# Patient Record
Sex: Female | Born: 1950
Health system: Southern US, Community
[De-identification: ages and names within clinical notes are randomized; demographics above are authoritative.]

## PROBLEM LIST (undated history)

## (undated) DIAGNOSIS — I639 Cerebral infarction, unspecified: Secondary | ICD-10-CM

## (undated) DIAGNOSIS — J40 Bronchitis, not specified as acute or chronic: Secondary | ICD-10-CM

## (undated) DIAGNOSIS — K759 Inflammatory liver disease, unspecified: Secondary | ICD-10-CM

## (undated) DIAGNOSIS — F419 Anxiety disorder, unspecified: Secondary | ICD-10-CM

## (undated) DIAGNOSIS — G629 Polyneuropathy, unspecified: Secondary | ICD-10-CM

## (undated) DIAGNOSIS — G894 Chronic pain syndrome: Secondary | ICD-10-CM

## (undated) DIAGNOSIS — R112 Nausea with vomiting, unspecified: Secondary | ICD-10-CM

## (undated) DIAGNOSIS — I10 Essential (primary) hypertension: Secondary | ICD-10-CM

## (undated) DIAGNOSIS — Z21 Asymptomatic human immunodeficiency virus [HIV] infection status: Secondary | ICD-10-CM

## (undated) DIAGNOSIS — M1990 Unspecified osteoarthritis, unspecified site: Secondary | ICD-10-CM

## (undated) DIAGNOSIS — N289 Disorder of kidney and ureter, unspecified: Secondary | ICD-10-CM

## (undated) DIAGNOSIS — R0600 Dyspnea, unspecified: Secondary | ICD-10-CM

## (undated) DIAGNOSIS — Z9889 Other specified postprocedural states: Secondary | ICD-10-CM

## (undated) DIAGNOSIS — K219 Gastro-esophageal reflux disease without esophagitis: Secondary | ICD-10-CM

## (undated) DIAGNOSIS — I499 Cardiac arrhythmia, unspecified: Secondary | ICD-10-CM

## (undated) DIAGNOSIS — T50902A Poisoning by unspecified drugs, medicaments and biological substances, intentional self-harm, initial encounter: Secondary | ICD-10-CM

## (undated) DIAGNOSIS — I2510 Atherosclerotic heart disease of native coronary artery without angina pectoris: Secondary | ICD-10-CM

## (undated) DIAGNOSIS — F1490 Cocaine use, unspecified, uncomplicated: Secondary | ICD-10-CM

## (undated) DIAGNOSIS — F17200 Nicotine dependence, unspecified, uncomplicated: Secondary | ICD-10-CM

## (undated) DIAGNOSIS — R569 Unspecified convulsions: Secondary | ICD-10-CM

## (undated) DIAGNOSIS — H9190 Unspecified hearing loss, unspecified ear: Secondary | ICD-10-CM

## (undated) DIAGNOSIS — B20 Human immunodeficiency virus [HIV] disease: Secondary | ICD-10-CM

## (undated) DIAGNOSIS — E669 Obesity, unspecified: Secondary | ICD-10-CM

## (undated) DIAGNOSIS — E785 Hyperlipidemia, unspecified: Secondary | ICD-10-CM

## (undated) DIAGNOSIS — I219 Acute myocardial infarction, unspecified: Secondary | ICD-10-CM

## (undated) DIAGNOSIS — T4145XA Adverse effect of unspecified anesthetic, initial encounter: Secondary | ICD-10-CM

## (undated) DIAGNOSIS — E876 Hypokalemia: Secondary | ICD-10-CM

## (undated) DIAGNOSIS — T8859XA Other complications of anesthesia, initial encounter: Secondary | ICD-10-CM

## (undated) HISTORY — PX: JOINT REPLACEMENT: SHX530

## (undated) HISTORY — PX: LAPAROTOMY: SHX154

## (undated) HISTORY — PX: BACK SURGERY: SHX140

## (undated) HISTORY — PX: FRACTURE SURGERY: SHX138

## (undated) HISTORY — PX: DIAGNOSTIC LAPAROSCOPY: SUR761

## (undated) HISTORY — PX: TONSILLECTOMY: SUR1361

---
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
## 1988-11-29 DIAGNOSIS — I639 Cerebral infarction, unspecified: Secondary | ICD-10-CM

## 1988-11-29 HISTORY — DX: Cerebral infarction, unspecified: I63.9

## 1991-11-30 DIAGNOSIS — I219 Acute myocardial infarction, unspecified: Secondary | ICD-10-CM

## 1991-11-30 HISTORY — DX: Acute myocardial infarction, unspecified: I21.9

## 2005-03-11 ENCOUNTER — Emergency Department: Payer: Self-pay | Admitting: General Practice

## 2005-07-05 ENCOUNTER — Emergency Department: Payer: Self-pay | Admitting: Emergency Medicine

## 2006-01-11 DIAGNOSIS — I6350 Cerebral infarction due to unspecified occlusion or stenosis of unspecified cerebral artery: Secondary | ICD-10-CM | POA: Insufficient documentation

## 2006-02-24 ENCOUNTER — Ambulatory Visit: Payer: Self-pay | Admitting: Nephrology

## 2006-07-11 DIAGNOSIS — M5126 Other intervertebral disc displacement, lumbar region: Secondary | ICD-10-CM | POA: Insufficient documentation

## 2008-01-04 ENCOUNTER — Emergency Department: Payer: Self-pay | Admitting: Emergency Medicine

## 2008-01-04 ENCOUNTER — Other Ambulatory Visit: Payer: Self-pay

## 2008-11-29 HISTORY — PX: HERNIA REPAIR: SHX51

## 2008-12-09 ENCOUNTER — Emergency Department: Payer: Self-pay | Admitting: Unknown Physician Specialty

## 2009-02-22 ENCOUNTER — Emergency Department: Payer: Self-pay | Admitting: Emergency Medicine

## 2009-03-23 ENCOUNTER — Emergency Department: Payer: Self-pay | Admitting: Emergency Medicine

## 2009-04-01 ENCOUNTER — Emergency Department: Payer: Self-pay | Admitting: Emergency Medicine

## 2009-06-29 ENCOUNTER — Emergency Department: Payer: Self-pay | Admitting: Emergency Medicine

## 2009-07-06 ENCOUNTER — Emergency Department: Payer: Self-pay | Admitting: Emergency Medicine

## 2009-09-21 ENCOUNTER — Emergency Department: Payer: Self-pay | Admitting: Emergency Medicine

## 2009-10-03 ENCOUNTER — Emergency Department: Payer: Self-pay | Admitting: Internal Medicine

## 2009-12-23 ENCOUNTER — Emergency Department: Payer: Self-pay

## 2010-01-08 ENCOUNTER — Emergency Department: Payer: Self-pay | Admitting: Emergency Medicine

## 2010-05-25 ENCOUNTER — Emergency Department: Payer: Self-pay | Admitting: Emergency Medicine

## 2011-02-18 ENCOUNTER — Emergency Department: Payer: Self-pay | Admitting: Internal Medicine

## 2011-10-30 ENCOUNTER — Emergency Department: Payer: Self-pay | Admitting: Emergency Medicine

## 2011-12-06 ENCOUNTER — Inpatient Hospital Stay: Payer: Self-pay | Admitting: Surgery

## 2011-12-06 LAB — COMPREHENSIVE METABOLIC PANEL
Alkaline Phosphatase: 204 U/L — ABNORMAL HIGH (ref 50–136)
Anion Gap: 11 (ref 7–16)
Bilirubin,Total: 1.2 mg/dL — ABNORMAL HIGH (ref 0.2–1.0)
Calcium, Total: 9.3 mg/dL (ref 8.5–10.1)
Chloride: 105 mmol/L (ref 98–107)
Co2: 25 mmol/L (ref 21–32)
Creatinine: 0.76 mg/dL (ref 0.60–1.30)
EGFR (African American): >60
EGFR (Non-African Amer.): >60
Glucose: 102 mg/dL — ABNORMAL HIGH (ref 65–99)
Potassium: 3.2 mmol/L — ABNORMAL LOW (ref 3.5–5.1)
SGOT(AST): 61 U/L — ABNORMAL HIGH (ref 15–37)
SGPT (ALT): 46 U/L

## 2011-12-06 LAB — CBC
HCT: 45 % (ref 35.0–47.0)
MCH: 34.2 pg — ABNORMAL HIGH (ref 26.0–34.0)
MCV: 101 fL — ABNORMAL HIGH (ref 80–100)
Platelet: 271 10*3/uL (ref 150–440)
RDW: 14.9 % — ABNORMAL HIGH (ref 11.5–14.5)

## 2011-12-06 LAB — URINALYSIS, COMPLETE
Bacteria: NONE SEEN
Bilirubin,UR: NEGATIVE
Glucose,UR: NEGATIVE mg/dL (ref 0–75)
Ketone: NEGATIVE
Nitrite: NEGATIVE
Ph: 6 (ref 4.5–8.0)
RBC,UR: 1 /HPF (ref 0–5)
Squamous Epithelial: 1

## 2011-12-06 LAB — LIPASE, BLOOD: Lipase: 79 U/L (ref 73–393)

## 2011-12-07 DIAGNOSIS — I369 Nonrheumatic tricuspid valve disorder, unspecified: Secondary | ICD-10-CM

## 2011-12-07 LAB — CBC WITH DIFFERENTIAL/PLATELET
Eosinophil %: 2.4 %
HCT: 38.1 % (ref 35.0–47.0)
Lymphocyte #: 3 10*3/uL (ref 1.0–3.6)
MCV: 103 fL — ABNORMAL HIGH (ref 80–100)
Monocyte %: 7.8 %
Neutrophil #: 3.8 10*3/uL (ref 1.4–6.5)
Neutrophil %: 50.2 %
RBC: 3.69 10*6/uL — ABNORMAL LOW (ref 3.80–5.20)
RDW: 14.8 % — ABNORMAL HIGH (ref 11.5–14.5)

## 2011-12-07 LAB — COMPREHENSIVE METABOLIC PANEL
Alkaline Phosphatase: 150 U/L — ABNORMAL HIGH (ref 50–136)
BUN: 6 mg/dL — ABNORMAL LOW (ref 7–18)
Bilirubin,Total: 1.1 mg/dL — ABNORMAL HIGH (ref 0.2–1.0)
Calcium, Total: 8.6 mg/dL (ref 8.5–10.1)
Chloride: 108 mmol/L — ABNORMAL HIGH (ref 98–107)
Co2: 26 mmol/L (ref 21–32)
Creatinine: 0.72 mg/dL (ref 0.60–1.30)
Osmolality: 281 (ref 275–301)
SGPT (ALT): 36 U/L
Sodium: 142 mmol/L (ref 136–145)
Total Protein: 6.7 g/dL (ref 6.4–8.2)

## 2011-12-08 LAB — CBC WITH DIFFERENTIAL/PLATELET
Basophil #: 0 10*3/uL (ref 0.0–0.1)
Eosinophil %: 2 %
HGB: 12.7 g/dL (ref 12.0–16.0)
Lymphocyte #: 3.3 10*3/uL (ref 1.0–3.6)
Lymphocyte %: 43.2 %
MCV: 104 fL — ABNORMAL HIGH (ref 80–100)
Monocyte %: 11 %
Neutrophil #: 3.3 10*3/uL (ref 1.4–6.5)
Neutrophil %: 43.4 %
Platelet: 228 10*3/uL (ref 150–440)
RBC: 3.76 10*6/uL — ABNORMAL LOW (ref 3.80–5.20)
RDW: 14.6 % — ABNORMAL HIGH (ref 11.5–14.5)
WBC: 7.5 10*3/uL (ref 3.6–11.0)

## 2011-12-08 LAB — BASIC METABOLIC PANEL
BUN: 5 mg/dL — ABNORMAL LOW (ref 7–18)
Chloride: 108 mmol/L — ABNORMAL HIGH (ref 98–107)
Creatinine: 0.79 mg/dL (ref 0.60–1.30)
EGFR (African American): >60
Potassium: 4.1 mmol/L (ref 3.5–5.1)

## 2011-12-28 ENCOUNTER — Inpatient Hospital Stay: Payer: Self-pay | Admitting: Surgery

## 2011-12-28 LAB — COMPREHENSIVE METABOLIC PANEL
Albumin: 2.4 g/dL — ABNORMAL LOW (ref 3.4–5.0)
Anion Gap: 11 (ref 7–16)
Bilirubin,Total: 0.6 mg/dL (ref 0.2–1.0)
Calcium, Total: 6.8 mg/dL — CL (ref 8.5–10.1)
Chloride: 112 mmol/L — ABNORMAL HIGH (ref 98–107)
Co2: 22 mmol/L (ref 21–32)
Creatinine: 0.62 mg/dL (ref 0.60–1.30)
Potassium: 2.6 mmol/L — ABNORMAL LOW (ref 3.5–5.1)
SGOT(AST): 33 U/L (ref 15–37)
SGPT (ALT): 25 U/L
Sodium: 145 mmol/L (ref 136–145)
Total Protein: 6 g/dL — ABNORMAL LOW (ref 6.4–8.2)

## 2011-12-28 LAB — CBC WITH DIFFERENTIAL/PLATELET
Basophil #: 0 10*3/uL (ref 0.0–0.1)
Basophil %: 0.4 %
Eosinophil #: 0.2 10*3/uL (ref 0.0–0.7)
HGB: 12.4 g/dL (ref 12.0–16.0)
Lymphocyte %: 39.1 %
Lymphocytes: 40 %
MCHC: 33.8 g/dL (ref 32.0–36.0)
Monocyte #: 0.6 10*3/uL (ref 0.0–0.7)
Monocytes: 6 %
Neutrophil %: 52.3 %
Platelet: 246 10*3/uL (ref 150–440)
RBC: 3.67 10*6/uL — ABNORMAL LOW (ref 3.80–5.20)
Segmented Neutrophils: 51 %

## 2011-12-28 LAB — URINALYSIS, COMPLETE
Bacteria: NONE SEEN
Bilirubin,UR: NEGATIVE
Blood: NEGATIVE
Glucose,UR: NEGATIVE mg/dL (ref 0–75)
Ketone: NEGATIVE
Nitrite: NEGATIVE
Ph: 6 (ref 4.5–8.0)
RBC,UR: <1 /HPF (ref 0–5)

## 2011-12-28 LAB — LIPASE, BLOOD: Lipase: 81 U/L (ref 73–393)

## 2011-12-28 LAB — PROTIME-INR: Prothrombin Time: 14.2 secs (ref 11.5–14.7)

## 2011-12-28 LAB — APTT: Activated PTT: 28.7 secs (ref 23.6–35.9)

## 2011-12-29 LAB — CBC WITH DIFFERENTIAL/PLATELET
Basophil #: 0 10*3/uL (ref 0.0–0.1)
Basophil %: 0.5 %
Eosinophil #: 0.3 10*3/uL (ref 0.0–0.7)
HCT: 40 % (ref 35.0–47.0)
Lymphocyte #: 2.9 10*3/uL (ref 1.0–3.6)
Lymphocyte %: 40.6 %
MCHC: 33 g/dL (ref 32.0–36.0)
Monocyte #: 0.4 10*3/uL (ref 0.0–0.7)
Monocytes: 8 %
Neutrophil #: 3.6 10*3/uL (ref 1.4–6.5)
RDW: 14 % (ref 11.5–14.5)
WBC: 7.3 10*3/uL (ref 3.6–11.0)

## 2011-12-29 LAB — COMPREHENSIVE METABOLIC PANEL
Albumin: 3 g/dL — ABNORMAL LOW (ref 3.4–5.0)
Anion Gap: 6 — ABNORMAL LOW (ref 7–16)
BUN: 7 mg/dL (ref 7–18)
Bilirubin,Total: 0.7 mg/dL (ref 0.2–1.0)
Chloride: 105 mmol/L (ref 98–107)
Creatinine: 1.01 mg/dL (ref 0.60–1.30)
EGFR (African American): >60
Glucose: 95 mg/dL (ref 65–99)
Potassium: 4.6 mmol/L (ref 3.5–5.1)
SGOT(AST): 49 U/L — ABNORMAL HIGH (ref 15–37)
SGPT (ALT): 33 U/L
Total Protein: 7.4 g/dL (ref 6.4–8.2)

## 2012-01-03 LAB — CULTURE, BLOOD (SINGLE)

## 2012-02-07 ENCOUNTER — Other Ambulatory Visit: Payer: Self-pay | Admitting: Ophthalmology

## 2012-02-07 LAB — CBC WITH DIFFERENTIAL/PLATELET
Basophil %: 0.3 %
HCT: 44.6 % (ref 35.0–47.0)
HGB: 15.1 g/dL (ref 12.0–16.0)
Lymphocyte #: 3.5 10*3/uL (ref 1.0–3.6)
MCH: 33.4 pg (ref 26.0–34.0)
MCHC: 33.7 g/dL (ref 32.0–36.0)
MCV: 99 fL (ref 80–100)
Monocyte #: 0.5 10*3/uL (ref 0.0–0.7)
Neutrophil #: 3.1 10*3/uL (ref 1.4–6.5)
RDW: 14 % (ref 11.5–14.5)

## 2012-03-14 ENCOUNTER — Emergency Department: Payer: Self-pay | Admitting: Emergency Medicine

## 2012-03-14 LAB — COMPREHENSIVE METABOLIC PANEL
Alkaline Phosphatase: 248 U/L — ABNORMAL HIGH (ref 50–136)
BUN: 6 mg/dL — ABNORMAL LOW (ref 7–18)
Bilirubin,Total: 1.1 mg/dL — ABNORMAL HIGH (ref 0.2–1.0)
Co2: 23 mmol/L (ref 21–32)
Creatinine: 0.73 mg/dL (ref 0.60–1.30)
Glucose: 91 mg/dL (ref 65–99)
Potassium: 3.4 mmol/L — ABNORMAL LOW (ref 3.5–5.1)
SGPT (ALT): 31 U/L
Sodium: 139 mmol/L (ref 136–145)
Total Protein: 8.4 g/dL — ABNORMAL HIGH (ref 6.4–8.2)

## 2012-03-14 LAB — URINALYSIS, COMPLETE
Bacteria: NONE SEEN
Bilirubin,UR: NEGATIVE
Blood: NEGATIVE
Glucose,UR: NEGATIVE mg/dL (ref 0–75)
Hyaline Cast: 6
Protein: NEGATIVE
RBC,UR: 1 /HPF (ref 0–5)
Squamous Epithelial: 2

## 2012-03-14 LAB — CBC
HCT: 44.4 % (ref 35.0–47.0)
MCV: 97 fL (ref 80–100)
Platelet: 314 10*3/uL (ref 150–440)
RDW: 13.9 % (ref 11.5–14.5)
WBC: 7.4 10*3/uL (ref 3.6–11.0)

## 2012-03-14 LAB — RAPID INFLUENZA A&B ANTIGENS

## 2013-02-26 DIAGNOSIS — M7730 Calcaneal spur, unspecified foot: Secondary | ICD-10-CM | POA: Insufficient documentation

## 2014-03-07 DIAGNOSIS — B359 Dermatophytosis, unspecified: Secondary | ICD-10-CM | POA: Insufficient documentation

## 2014-06-18 DIAGNOSIS — N289 Disorder of kidney and ureter, unspecified: Secondary | ICD-10-CM | POA: Insufficient documentation

## 2014-12-09 DIAGNOSIS — M5416 Radiculopathy, lumbar region: Secondary | ICD-10-CM | POA: Diagnosis not present

## 2014-12-09 DIAGNOSIS — F329 Major depressive disorder, single episode, unspecified: Secondary | ICD-10-CM | POA: Diagnosis not present

## 2014-12-09 DIAGNOSIS — M545 Low back pain: Secondary | ICD-10-CM | POA: Diagnosis not present

## 2014-12-09 DIAGNOSIS — I10 Essential (primary) hypertension: Secondary | ICD-10-CM | POA: Diagnosis not present

## 2014-12-09 DIAGNOSIS — M179 Osteoarthritis of knee, unspecified: Secondary | ICD-10-CM | POA: Diagnosis not present

## 2014-12-09 DIAGNOSIS — M79606 Pain in leg, unspecified: Secondary | ICD-10-CM | POA: Diagnosis not present

## 2014-12-09 DIAGNOSIS — F1920 Other psychoactive substance dependence, uncomplicated: Secondary | ICD-10-CM | POA: Diagnosis not present

## 2014-12-09 DIAGNOSIS — R52 Pain, unspecified: Secondary | ICD-10-CM | POA: Diagnosis not present

## 2014-12-09 DIAGNOSIS — M5126 Other intervertebral disc displacement, lumbar region: Secondary | ICD-10-CM | POA: Diagnosis not present

## 2014-12-10 DIAGNOSIS — M79609 Pain in unspecified limb: Secondary | ICD-10-CM | POA: Diagnosis not present

## 2014-12-17 DIAGNOSIS — M5126 Other intervertebral disc displacement, lumbar region: Secondary | ICD-10-CM | POA: Diagnosis not present

## 2014-12-17 DIAGNOSIS — M5416 Radiculopathy, lumbar region: Secondary | ICD-10-CM | POA: Diagnosis not present

## 2014-12-17 DIAGNOSIS — M5137 Other intervertebral disc degeneration, lumbosacral region: Secondary | ICD-10-CM | POA: Diagnosis not present

## 2014-12-17 DIAGNOSIS — M5417 Radiculopathy, lumbosacral region: Secondary | ICD-10-CM | POA: Diagnosis not present

## 2015-01-06 DIAGNOSIS — R21 Rash and other nonspecific skin eruption: Secondary | ICD-10-CM | POA: Diagnosis not present

## 2015-01-06 DIAGNOSIS — L859 Epidermal thickening, unspecified: Secondary | ICD-10-CM | POA: Diagnosis not present

## 2015-01-10 DIAGNOSIS — M79609 Pain in unspecified limb: Secondary | ICD-10-CM | POA: Diagnosis not present

## 2015-02-05 DIAGNOSIS — M5126 Other intervertebral disc displacement, lumbar region: Secondary | ICD-10-CM | POA: Diagnosis not present

## 2015-02-05 DIAGNOSIS — M5417 Radiculopathy, lumbosacral region: Secondary | ICD-10-CM | POA: Diagnosis not present

## 2015-03-23 NOTE — Discharge Summary (Signed)
PATIENT NAME:  Lisa Hawkins, ASKIN MR#:  [id number] DATE OF BIRTH:  [birth year]  DATE OF ADMISSION:  12/06/2011 DATE OF DISCHARGE:  12/10/2011  DISCHARGE DIAGNOSES:  1. Incarcerated recurrent ventral hernia.  2. HIV positivity.  3. Hepatitis C positivity.  4. Coronary artery disease.   PROCEDURE: Repair of incarcerated recurrent ventral hernia with mesh.   HISTORY OF PRESENT ILLNESS/HOSPITAL COURSE: This patient was admitted to the hospital with signs of recurrent ventral hernia which was incarcerated.  She had had a prior umbilical hernia repair and this has recurred. She was taken to the Operating Room where incarcerated omentum was reduced and the large rent was prepared with mesh. She made an uncomplicated postoperative recovery and was discharged in stable condition to follow up in my office in 10 days. She was tolerating a regular diet and utilizing oral pain medications.     ____________________________ Jerrol Banana Burt Knack, MD rec:bjt D: 12/21/2011 00:08:25 ET T: 12/22/2011 09:25:05 ET JOB#: [id number]  cc: Jerrol Banana. Burt Knack, MD, <Dictator> Florene Glen MD ELECTRONICALLY SIGNED 12/22/2011 19:31

## 2015-03-23 NOTE — H&P (Signed)
PATIENT NAME:  KINDEL, ROCHEFORT MR#:  [id number] DATE OF BIRTH:  Feb 02, 1951  DATE OF ADMISSION:  12/28/2011  HISTORY OF PRESENT ILLNESS: Ms. Trombetta is a 64 year old black female who underwent redo repair of a recurrent ventral hernia 12/07/2011 with Ventralex mesh (6 cm). She did well and was seen in the office postoperatively and was then began developing periumbilical pain at the area of her incision on Sunday (two days ago). This pain has gotten much worse and is associated with nausea. She does not know whether she has had fever or chills.   PAST MEDICAL HISTORY:  1. HIV positive.  2. Chronic active hepatitis C virus.  3. Remote history of IV drug abuse.  4. History of myocardial infarction 1983.  5. History of cerebrovascular accident 2003.  6. Gallstones.  7. Hypertension.   PAST SURGICAL HISTORY:  1. Ventral hernia repair.  2. Redo ventral hernia repair with mesh. 3. Hysterectomy,  4.   Bilateral oophorectomy (separate procedures).  5. Back surgery. 6. ORIF of the wrist.   ALLERGIES: Penicillin.   MEDICATIONS:  1. Amitriptyline 25 mg daily.  2. Amlodipine 10 mg daily. 3. Citalopram 30 mg daily.  4. Hydrochlorothiazide 25 mg daily.  5. Lamivudine 300 mg daily.  6. Omeprazole 20 mg daily.  7. Sustiva 600 mg daily.  8. Tramadol 50 mg t.i.d.  9. Viread 300 mg daily.  10. Zoloft 50 mg daily.   FAMILY HISTORY: Noncontributory.   REVIEW OF SYSTEMS: Negative for 10 systems except as mentioned in the history of present illness above.   SOCIAL HISTORY: The patient is separated from her husband and lives with someone else. She smokes 1 pack of cigarettes per day and does not drink alcohol. She has a previous remote history of IV drug abuse.   PHYSICAL EXAMINATION:  GENERAL: A pleasant, middle-aged black female who has some anxiety.   VITAL SIGNS: Height 5 feet 3 inches, weight 213 pounds. BMI 37.7. Temperature 98.2, pulse 104, respirations 18, blood pressure 151/94, oxygen  saturation 97% on room air.   HEENT: Pupils equally round and reactive to light. Extraocular movements intact. Sclerae nonicteric. Oropharynx clear. Mucous membranes moist.   NECK: Without thyromegaly. The trachea is midline and there is no jugular venous distention.   HEART: Regular rate and rhythm with no murmurs or rubs.   LUNGS: Clear to auscultation with normal respiratory effort bilaterally.   ABDOMEN: Reveals a periumbilical midline scar with surrounding blanching erythema that is exquisitely tender to touch. I cannot appreciate any fluctuance, however.   EXTREMITIES: No edema with normal capillary refill bilaterally.   NEUROLOGIC: Cranial nerves II through XII, motor and sensation grossly intact.   PSYCHIATRIC: Alert and oriented x4. Appropriate affect.   ASSESSMENT: Cellulitis in a patient who is immunosuppressed with HIV in a previous area of redo ventral hernia repair with mesh. Possible mesh infection.   PLAN: Admit to hospital for IV antibiotics and observation. If she improves on IV antibiotics I will convert her to p.o. antibiotics and discharge her. If she does not improve, she may need a drainage of the incision. ID consult since HIV +  ____________________________ Consuela Mimes, MD wfm:cms D: 12/28/2011 16:10:28 ET T: 12/28/2011 16:37:38 ET JOB#: [id number]  cc: Consuela Mimes, MD, <Dictator> Consuela Mimes MD ELECTRONICALLY SIGNED 12/28/2011 17:41

## 2015-03-23 NOTE — Op Note (Signed)
PATIENT NAME:  Lisa Hawkins, Lisa Hawkins MR#:  [id number] DATE OF BIRTH:  [birth year]  DATE OF PROCEDURE:  12/07/2011  PREOPERATIVE DIAGNOSIS: Incarcerated recurrent ventral hernia.   POSTOPERATIVE DIAGNOSIS: Incarcerated recurrent ventral hernia.   PROCEDURE: Repair of incarcerated recurrent ventral hernia with mesh.   SURGEON: Phoebe Perch, M.D.   ANESTHESIA: General with endotracheal tube.   INDICATIONS: This is a patient with a recurrent ventral hernia which is incarcerated. Preoperatively we discussed rationale for surgery, the options of observation, the potential for using mesh, and the risk of mesh infection and recurrence as well as cosmetic deformity. This was all reviewed for her in the preop holding area. She understood and agreed to proceed.   FINDINGS: Recurrent ventral hernia. The prior ventral hernia in the periumbilical area had been repaired with zero Ethibonds and there was a recurrence of the cephalad extent of these Ethibond sutures with incarcerated omentum and no bowel involved and no stomach involved.   DESCRIPTION OF PROCEDURE: The patient was induced to general anesthesia. She was given IV antibiotics preoperatively. VTE prophylaxis was in place. She was prepped and draped in a sterile fashion. A midline incision was utilized to open and explore the subcutaneous tissues and in so doing a large hernia sac was identified with omentum present. The hernia sac was opened and the fascial edges were cleaned. The omentum was reduced into the abdomen, but in order to completely reduce the omentum the hernial rent needed to be enlarged slightly with electrocautery and then the omental portion was reduced into the abdominal cavity.  The area was inspected, fascial edges were cleaned, and the preperitoneal space was developed and into that space was placed a 6 cm Ventralex patch with PTFE. It was held in with figure-of-eight and U sutures of 0 Prolene and then the perifascial and fascial  tissues were closed over the top of the mesh as well.   Marcaine was infiltrated in the skin and subcutaneous tissues for a total of 30 mL and then the wound was closed in multiple layers with 0 Vicryl to isolate the fascia, mesh, and sutures from the skin, etc.   A deep suture at the dermal edges was performed with 0 Vicryl as well and then skin staples were placed in a sterile dressing was placed.   Sponge, lap, and needle counts were correct. The patient tolerated the procedure well. There were no complications. She was taken to the Recovery Room in stable condition to be admitted for continued care.  ____________________________ Jerrol Banana. Burt Knack, MD rec:slb D: 12/07/2011 14:58:15 ET T: 12/07/2011 16:22:42 ET JOB#: [id number]  cc: Jerrol Banana. Burt Knack, MD, <Dictator> Florene Glen MD ELECTRONICALLY SIGNED 12/07/2011 17:23

## 2015-03-23 NOTE — Discharge Summary (Signed)
PATIENT NAME:  Lisa Hawkins, Lisa Hawkins MR#:  [id number] DATE OF BIRTH:  21-Feb-1951  DATE OF ADMISSION:  12/28/2011 DATE OF DISCHARGE:  12/30/2011  PRINCIPLE DIAGNOSIS: Infected postoperative hematoma following redo repair of ventral hernia with mesh.   OTHER DIAGNOSES:  1. HIV positive.  2. Chronic active Hepatitis C virus.  3. Remote history of IV drug abuse.  4. History of myocardial infarction in 1983.  5. History of cerebrovascular accident in 2003.  6. Gallstones.  7. Hypertension.  8. Status post ventral hernia repair.  9. Status post redo ventral hernia repair with mesh.  10. Status post hysterectomy.  11. Status post bilateral oophorectomy.  12. Status post back surgery.  13. Status post ORIF of the wrist.   HOSPITAL COURSE: [name] was admitted to the hospital and given analgesics and IV antibiotics. An Infectious Disease consultation was obtained and converted her IV antibiotics to p.o. doxycycline. Her area of redness spontaneously drained what looked like old hematoma that was likely infected. By the 31st the erythema was completely resolved, there was just minimal drainage, and the patient had been afebrile throughout her hospitalization. Therefore, she was discharged home on p.o. doxycycline and given an appointment to follow-up with Dr. Burt Knack.  ____________________________ Consuela Mimes, MD wfm:drc D: 12/30/2011 11:29:38 ET T: 12/30/2011 13:13:34 ET JOB#: [id number]  cc: Consuela Mimes, MD, <Dictator> Consuela Mimes MD ELECTRONICALLY SIGNED 01/06/2012 20:13

## 2015-03-23 NOTE — Consult Note (Signed)
PATIENT NAME:  Lisa Hawkins, Lisa Hawkins MR#:  [id number] DATE OF BIRTH:  13-Mar-1951  DATE OF CONSULTATION:  12/06/2011  REFERRING PHYSICIAN:  Phoebe Perch, MD  PRIMARY CARE PHYSICIAN and INFECTIOUS DISEASE SPECIALIST:  [facility]  CONSULTING PHYSICIAN:  Cherre Huger, MD  REASON FOR CONSULTATION: Preoperative medical evaluation.   HISTORY OF PRESENT ILLNESS: The patient is a 64 year old female with past medical history of HIV, hepatitis C, hypertension who presented to the Emergency Room complaining of abdominal pain and a knot in her stomach. She was found to have a ventral hernia. Dr. Burt Knack planned to take her to the Operating Room on 01/08 to repair it. Medicine was consulted for preoperative medical clearance. The patient reports that she had an MI in 1983 and was admitted to [facility] and at that time she had developed some left arm numbness. She denies having any angioplasty, stents or being referred for bypass surgery. She also reported that she had a CVA in 2013 because her legs felt stiff.  Reported that she was diagnosed with a CVA in 2013 after she went to the hospital for the leg stiffness. Currently she is able to ambulate independently. She denies any chest pain, shortness of breath, history of heart failure however, she does report that she is not very physically active. Denies any history of having recurrent stroke, or a heart attack. Denies any family history of premature coronary artery disease.  ALLERGIES: Penicillin.   CURRENT MEDICATIONS: Vicodin 1 tablet every six hours p.r.n.; amitriptyline 25 mg daily.; Epivir 300 mg daily; Neurontin 300 mg daily; hydrochlorothiazide 25 mg daily; Naproxen p.r.n.; omeprazole 20 mg daily; Sustiva 600 mg daily;Marland Kitchen Zoloft 50 mg daily; Tramadol 50 mg t.i.d. p.r.n.   PAST MEDICAL HISTORY:  1. History of HIV.  2. Hepatitis C.  3. Hypertension.  4. History of myocardial infarction in 1983. 5. Cerebrovascular accident 2003.  PAST SURGICAL  HISTORY: Hysterectomy, oophorectomy, left wrist ORIF for fracture, back surgery, tonsillectomy, prior  ventral hernia repair.  SOCIAL HISTORY: Smokes 1 pack per day. He denies any alcohol or drug abuse in the past. She used to shoot heroine and cocaine IV. She lives with her friend.   FAMILY HISTORY: Mother had some back problems. Father had cerebrovascular accident.   REVIEW OF SYSTEMS:  CONSTITUTIONAL: Reports chronic fatigue and weakness. Denies any fever.   EYES: Denies any double or blurred vision.   ENT: Denies any tinnitus, ear pain.   RESPIRATORY: Denies any painful respiration, wheezing, and cough.   CVS:  Denies any chest pain, tachycardia, or palpitations.   GI: Denies any nausea, vomiting, diarrhea, has abdominal pain.   GU: Denies any nocturia, or polyuria.   MUSCULOSKELETAL: Denies any joint pain, stiffness  INTEGUMENTARY:. She denies any rashes or eruptions.   NEUROLOGICAL: Denies any fainting, blackouts, seizures, gives history of cerebrovascular accident.   PSYCH: Has history of depression. Denies any insomnia or headache.   ENDOCRINE: Denies any thyroid problems, heat or cold intolerance.  HEME/LYMPH: Denies any anemia or easy bruisability.   PHYSICAL EXAMINATION:  VITAL SIGNS: Temperature 98, heart rate 99, respiratory  rate 18, blood pressure 125/67, pulse oximetry 98% on room air.   GENERAL: The patient is a 64 year old African American female who is  sitting up propped up in bed in some distress.   HEAD: Atraumatic, normocephalic.   EYES: No pallor, icterus, or cyanosis. Pupils equal, round, and reactive to light and accommodation. Extraocular movements intact.   ENT: Wet mucous membranes. No oropharyngeal  erythema or thrush.   NECK: Supple. No masses. No JVD. No thyromegaly or lymphadenopathy. Chest wall: No tenderness to palpation. Not using accessory muscles of respiration. No intercostal retractions.   LUNGS: The patient has bilateral basal  crepitations. No wheezing or rhonchi.   CARDIOVASCULAR: S1, S2 regular. No murmur, rubs, or gallops.   ABDOMEN: Soft, tender to palpation. The patient has a periumbilical ventral hernia. No guarding. No rigidity. Normal bowel sounds.   SKIN: No rashes or lesions.  PERIPHERY:  No pedal edema, 1+ pedal pulses.   MUSCULOSKELETAL: No cyanosis or clubbing.   NEUROLOGICAL: Awake, alert, oriented x3. Nonfocal neurological exam. Cranial nerves grossly intact.   PSYCH: Normal mood and affect.   Laboratory, diagnostic, and radiological data: CAT scan of the abdomen shows enlargement of ventral hernia which was increased in size. No evidence of any  gallstones. Urinalysis shows no evidence of infection. Glucose 102, normal BUN and creatinine, sodium 141, potassium 3.2, bilirubin 1.2 and ALT 204, AST 61. Normal CBC except for mild microcytosis.   ASSESSMENT AND PLAN: A 64 year old female with past medical history of HIV, hepatitis C, possible myocardial infarction, cerebrovascular accident, hypertension, admitted to surgical service for repair of ventral hernia. Medicine is consulted for preoperative medical evaluation.  1. Preop medical evaluation. The patient reports that she had an myocardial infarction in 1983; however  denies having any recurrent myocardial infarctions, angioplasty, stents status, or  coronary artery bypass graft and she did also reports having a CVA in 2013 without any residual  deficit. She ambulates independently. Currently denies any chest pain, shortness of breath, although reports that she is physically not very active. She is moderate to high risk for surgery. However, there is no obvious contraindication to proceeding with surgery. Recommend starting low-dose beta blocker, especially since the patient has a history of hypertension. Should continue  postoperatively also. Will also check her echocardiogram.  2. Hypokalemia, likely due to the patient being on treatment with  hydrochlorothiazide for hypertension replace p.o.  3. History of HIV and hepatitis B.  The patient follows up with [facility] would continue the patient's Epivir and Sustiva. The patient's CBC is currently normal.  4. Hyperglycemia, likely reactive. The patient has no history of diabetes. 5. For elevated bilirubin, ALT and AST: Possibly due to history of gallstones. Monitor. 6. Hypertension. Treated with low-dose beta blocker instead of HCTZ given his history of myocardial infarction and cerebrovascular accident.  7. History of GI bleed. The patient is on IV PPI. Ventral hernia management as per surgery. R  Reviewed all medical records, discussed with the patient the plan of care and management.   TIME SPENT: 75  minutes.    ____________________________ Cherre Huger, MD sp:ljs D: 12/06/2011 19:29:46 ET T: 12/07/2011 10:11:32 ET JOB#: [id number]  cc: Cherre Huger, MD, <Dictator> Richard E. Burt Knack, MD Cherre Huger MD ELECTRONICALLY SIGNED 12/07/2011 19:17

## 2015-03-23 NOTE — H&P (Signed)
PATIENT NAME:  Lisa Hawkins, Lisa Hawkins MR#:  [id number] DATE OF BIRTH:  Sep 10, 1951  DATE OF ADMISSION:  12/06/2011  CHIEF COMPLAINT: Abdominal pain.   HISTORY OF PRESENT ILLNESS: This is a patient with a 2.5 to 3 day history of abdominal pain and mass. She points to a mass in the cephalad area near the umbilicus. It has not been worsening. She has had no nausea or vomiting until she tried taking CT contrast. Has been able to eat and has had normal bowel movements and passing flatus. She's had no fevers or chills. She states she has never had an episode like this before but on reviewing prior CT scans of this hernia a recurrence has been there for at least two years back to 2010.   Of note, the patient is HIV positive and Hepatitis C positive as well. She has had a prior repair of an umbilical hernia. She is not sure if mesh was used. This was in the remote past.   PAST MEDICAL HISTORY:  1. HIV positivity. 2. Hepatitis C positivity likely secondary to prior IV drug abuse. She denies recent IV drug abuse.  3. She has had an MI and has coronary artery disease, possible valvular disease.   PAST SURGICAL HISTORY:  1. Back surgery.  2. Ventral hernia repair, not clear if mesh was utilized. Since that time she has had two separate GYN surgeries, a hysterectomy followed by a bilateral oophorectomy.   ALLERGIES: Penicillin.   MEDICATIONS: Multiple, see chart.   FAMILY HISTORY: Noncontributory.   SOCIAL HISTORY: The patient denies tobacco abuse and stopped IV drug abuse years ago.   REVIEW OF SYSTEMS: 10 system review has been performed and negative with the exception of that mentioned in the history of present illness.   PHYSICAL EXAMINATION:   GENERAL: Morbidly obese female patient 63 inches tall, 213 pounds, BMI 37.7.  VITAL SIGNS: Temperature 96.9, pulse 99, respirations 20, blood pressure 119/71, pain scale 0, room air sats 94%.   HEENT: No scleral icterus.   NECK: No palpable neck nodes.    CHEST: Clear to auscultation.   CARDIAC: Regular rate and rhythm.   ABDOMEN: Soft, nondistended. There is a somewhat hard mass in the area cephalad and to the left of the umbilicus. There is a scar at the umbilicus. This mass is nonreducible, tender but there is no overlying erythema. Pfannenstiel incision is present.   EXTREMITIES: Minimal edema.   NEUROLOGIC: Grossly intact.   INTEGUMENTARY: No jaundice.   LABORATORY, DIAGNOSTIC, AND RADIOLOGICAL DATA: CT scan is personally reviewed and compared to CT from 2010. There is a recurrent ventral hernia in the area of the umbilicus somewhat cephalad and to the left of the umbilicus. There is no bowel involvement in the hernia itself, a fairly large sac with a small rent.   White blood cell count 7.5, hemoglobin and hematocrit 15 and 45. Lipase 79. Electrolytes showed potassium of 3.2 and a bilirubin 1.2 with an alkaline phosphatase of 204 and a glucose of 102. Urinalysis is clean.   ASSESSMENT AND PLAN: This is a patient with HIV and Hepatitis C who presents with a recurrent ventral hernia which is incarcerated. It has been out for at least two days but has been present, at least on CT scan, since 2010. I do not believe the patient needs emergent operation for this repair in that there is likely history of longstanding incarceration and the fact that there is no bowel involved. My plan would be to admit  the patient to the hospital, hydrate her, and ask [facility] to see her for evaluation, a surgical clearance, and assistance in management of her medical problems perioperatively. I have discussed with her the rationale for offering surgery and the potential for mesh placement and the risks of bleeding, infection, and recurrence. She understands all this. I will review that for her again prior to her surgical intervention which will be planned for tomorrow.   ____________________________ Jerrol Banana Burt Knack, MD rec:drc D: 12/06/2011 18:36:30  ET T: 12/07/2011 06:41:58 ET JOB#: [id number]  cc: Jerrol Banana. Burt Knack, MD, <Dictator> Florene Glen MD ELECTRONICALLY SIGNED 12/07/2011 17:22

## 2015-03-23 NOTE — H&P (Signed)
Subjective/Chief Complaint abd pain    History of Present Illness pain and bulge started Saturday 2 1/2 days ago, not worsening, no n/v. no f/c. no prior episode.    Past History HIV positive likely secondary to IVDA. CAD with MI and possible valvular dz PSH  back surgery, VH repair (mesh?) hysterectomy, oophorectomy (both two surgeries after VH repair)    Past Medical Health Coronary Artery Disease   Past Med/Surgical Hx:  CVA:   Hepatitis C:   MI:   HIV:   Hypertension:   back surgery:   partial hysterectomy:   tonsillectomy:   Back Surgery:   ALLERGIES:  Penicillin: Anaphylaxis  Family and Social History:   Family History Non-Contributory    Social History negative tobacco, negative ETOH, positive Illicit drugs, in past    Place of Living Home   Review of Systems:   Subjective/Chief Complaint abd pain and mass    Fever/Chills No    Cough No    Abdominal Pain Yes    Diarrhea No    Constipation No    Nausea/Vomiting No    SOB/DOE No    Chest Pain No    Dysuria No    Tolerating Diet Yes   Physical Exam:   GEN NAD, obese    HEENT pink conjunctivae    NECK supple    RESP normal resp effort  clear BS    CARD regular rate    ABD positive tenderness  positive hernia  soft  adominal Mass  periumb scar. rec VH with incarceration cephalad and to left of umbilicus    LYMPH negative neck    EXTR negative edema    SKIN normal to palpation    PSYCH alert, A+O to time, place, person, good insight   Routine UA:  07-Jan-13 13:05    Color (UA) Yellow   Clarity (UA) Clear   Glucose (UA) Negative   Bilirubin (UA) Negative   Ketones (UA) Negative   Specific Gravity (UA) 1.010   Blood (UA) Negative   pH (UA) 6.0   Protein (UA) Negative   Nitrite (UA) Negative   Leukocyte Esterase (UA) Trace   RBC (UA) 1 /HPF   WBC (UA) 1 /HPF   Epithelial Cells (UA) 1 /HPF   Calcium Oxalate Crystal (UA) PRESENT  Routine Chem:  07-Jan-13 13:05     Glucose, Serum 102   BUN 10   Creatinine (comp) 0.76   Sodium, Serum 141   Potassium, Serum 3.2   Chloride, Serum 105   CO2, Serum 25   Calcium (Total), Serum 9.3  Hepatic:  07-Jan-13 13:05    Bilirubin, Total 1.2   Alkaline Phosphatase 204   SGPT (ALT) 46   SGOT (AST) 61   Total Protein, Serum 8.2   Albumin, Serum 3.6  Routine Chem:  07-Jan-13 13:05    Osmolality (calc) 280   eGFR (African American) >60   eGFR (Non-African American) >60   Anion Gap 11  Routine Hem:  07-Jan-13 13:05    WBC (CBC) 7.5   RBC (CBC) 4.44   Hemoglobin (CBC) 15.2   Hematocrit (CBC) 45.0   Platelet Count (CBC) 271   MCV 101   MCH 34.2   MCHC 33.7   RDW 14.9  Routine Chem:  07-Jan-13 13:05    Lipase 79     Assessment/Admission Diagnosis CT pending will check CT to determine planning and timing of surgery. Has been incarcerated for several days without signs of obstruction therefore doubt  bowel involvement. await CT disc options with pt. she wants surgery here rather than at [facility] where she gets most of her care.   Electronic Signatures: Florene Glen (MD)  (Signed 07-Jan-13 18:00)  Authored: CHIEF COMPLAINT and HISTORY, PAST MEDICAL/SURGIAL HISTORY, ALLERGIES, FAMILY AND SOCIAL HISTORY, REVIEW OF SYSTEMS, PHYSICAL EXAM, LABS, ASSESSMENT AND PLAN   Last Updated: 07-Jan-13 18:00 by Florene Glen (MD)

## 2015-03-23 NOTE — Consult Note (Signed)
Impression: 64yo BF w/ h/o HIV infection, chronic hep C infection, recent ventral hernia repair with mesh placement admitted with possible wound infection vs hematoma.  She had no WBC or fever systemically, but she describes the area around the wound as being red and hot.  The lesion has drained and she is feeling better today.  No cultures were obtained from the drained material and currently there is little drainage.  It is difficult to say whether there was infection present.  Typically drainage is all that is needed to resolve this.  If antibiotic coverage is desired, will change her to doxycycline.  This will cover Strep and Staph (including MRSA). She does not know her CD4 count.  She says that her provider at [facility] told her her numbers were "good".  She is not on OI prophylaxis.  Would continue her current HAART. She has never been treated for hep C infection.  She reports that the providers at [facility] were waiting until her back pain was adequately addressed.  She should follow up with her regular physician as scheduled.  Electronic Signatures: Roizy Harold, Heinz Knuckles (MD)  (Signed on 30-Jan-13 12:42)  Authored  Last Updated: 30-Jan-13 12:42 by Gregor Dershem, Heinz Knuckles (MD)

## 2015-03-23 NOTE — Consult Note (Signed)
PATIENT NAME:  Lisa Hawkins, Lisa Hawkins MR#:  [id number] DATE OF BIRTH:  Apr 08, 1951  DATE OF CONSULTATION:  12/29/2011  REFERRING PHYSICIAN:  Consuela Mimes, Hawkins  CONSULTING PHYSICIAN:  Lisa Hawkins  REASON FOR CONSULTATION: HIV patient with a possible wound infection.   HISTORY OF PRESENT ILLNESS: The patient is a 64 year old black female with a past history significant for chronic hepatitis C infection, HIV infection with an unknown CD4 count, and a recent ventral hernia repair with mesh placement on 12/07/2011, who was admitted on 12/28/2011 with acute onset several days prior to admission of pain in the area of her prior incision. The pain increased over time. She developed two areas at the superior and inferior aspect of her wound that were swollen, and she describes some erythema around the site. She had no fevers, chills, or sweats, but she did have notice warmth from the wound itself. She was seen by Surgery and admitted to the hospital. She has been started on vancomycin. Blood cultures have been drawn and are negative to date. She has remained afebrile in the hospital, and her white count has been normal. Today, she had an area of the wound open and drain bloody and clotted material. No purulence was noted by the patient. Her pain has eased somewhat. She has not had any significant change in her bowels. She has not had any nausea or vomiting. She is currently on vancomycin.   ALLERGIES: Penicillin.   PAST MEDICAL HISTORY:  1. HIV infection. She is followed at [facility]. She does not know her CD4 count or her viral load. She is currently on HAART and is tolerating them well. She states that the last time she was seen she was told that her blood counts were all good.  2. Chronic hepatitis C infection: She has not been on therapy in the past. She was told that they would address this after her back issues were settled.  3. Chronic back pain. She has had surgery in the past. She is  currently receiving injections through pain management.  4. Status post myocardial infarction.  5. Status post CVA.  6. Hypertension.  7. Ventral hernia repair in January of 2013.  8. Status post hysterectomy.  9. Status post bilateral oophorectomy.  SOCIAL HISTORY: The patient lives with a significant other. She smokes a pack of cigarettes per day. She does not drink. She has a prior history of injecting drug use but no current use.   FAMILY HISTORY: Noncontributory.   REVIEW OF SYSTEMS: GENERAL: No fevers, chills, sweats, RESPIRATORY: No cough or shortness of breath. No sputum production. CARDIAC: No chest pains or palpitations. GI: No nausea or vomiting. No change in her bowels. She does have abdominal pain as described above. GENITOURINARY: No change in her urine. MUSCULOSKELETAL: No complaints. SKIN: No rashes.  NEUROLOGICAL: No focal weakness.   PHYSICAL EXAMINATION:  VITAL SIGNS: T-max of 98.5, pulse 93, blood pressure 118/74, 97% on room air.   GENERAL: A 64 year old black female in no acute distress.   HEENT: Normocephalic, atraumatic.   CHEST: Clear to auscultation bilaterally with good air movement. No focal consolidation.   CARDIAC: Regular rate and rhythm without murmur, rub, or gallop.   ABDOMEN: Soft, nontender, and nondistended. No hepatosplenomegaly. No hernia is noted. There is a midline incision that appears to be healing well. There was no significant erythema surrounding the wound. To the left slightly superior aspect of the wound, there was an ulceration. The dressing has  serosanguineous drainage on it. There was no purulence. The area around the wound was tender to touch. There was no an area of fluctuance around the ulceration or the rest of the wound.   EXTREMITIES: No evidence for tenosynovitis.   SKIN: No rashes other than the ulcer around the surgical site.   NEUROLOGIC: The patient was awake and interactive, moving all four extremities.   PSYCHIATRIC: Mood  and affect appeared normal.     LABORATORY, DIAGNOSTIC AND RADIOLOGICAL DATA:  BUN 7, creatinine 1.01, bicarbonate 26, anion gap of 6, AST 49, ALT 33, alkaline phosphatase 200, total bilirubin 0.7.  White count of 7.3, hemoglobin 13.2, platelet count of 251, ANC of 3.6.  Blood cultures from admission showed no growth.  A urinalysis was unremarkable.  A three-way of the abdomen demonstrated atelectasis in the left base but no other abnormalities noted.   IMPRESSION: A 64 year old black female with a past history significant for HIV infection, chronic hepatitis C infection, recent ventral hernia repair with mesh placement, admitted with possible wound infection versus hematoma.   RECOMMENDATIONS:  1. She has had no white cells, fever systemically, but she describes the area around the wound as being red and hot. The lesion is drained and she is feeling better today. No cultures were obtained from the drained material, and currently there is little drainage.  2. It is difficult to say whether there was infection present. Typically, drainage is all that is needed to resolve this. If antibiotic coverage is desired, we will change her to doxycycline. This will cover strep and staph (including methicillin-resistant Staphylococcus aureus).  3. She does not know her CD4 count. She says that her provider at [facility] told her that her numbers were "good." She is not on opportunistic infection prophylaxis. We will continue her current HAART.  4. She has never been treated for hepatitis C infection. She reports the providers at [facility] were waiting until her back pain was adequately addressed. She should follow up with her regular physician as scheduled.   This is a low-level Infectious Disease consult.   Thank you very much for involving me in Lisa Hawkins's care.  ____________________________ Lisa Knuckles. Greer Koeppen, Hawkins meb:cbb D: 12/29/2011 14:47:14 ET T: 12/29/2011 15:37:41 ET JOB#: [id number]  cc: Lisa Knuckles.  Lisa Baksh, Hawkins, <Dictator> Lisa Velador E Shelie Lansing Hawkins ELECTRONICALLY SIGNED 01/10/2012 13:19

## 2015-04-09 DIAGNOSIS — F329 Major depressive disorder, single episode, unspecified: Secondary | ICD-10-CM | POA: Diagnosis not present

## 2015-04-09 DIAGNOSIS — F1920 Other psychoactive substance dependence, uncomplicated: Secondary | ICD-10-CM | POA: Diagnosis not present

## 2015-04-09 DIAGNOSIS — Z720 Tobacco use: Secondary | ICD-10-CM | POA: Diagnosis not present

## 2015-04-09 DIAGNOSIS — M7731 Calcaneal spur, right foot: Secondary | ICD-10-CM | POA: Diagnosis not present

## 2015-04-09 DIAGNOSIS — M5126 Other intervertebral disc displacement, lumbar region: Secondary | ICD-10-CM | POA: Diagnosis not present

## 2015-04-09 DIAGNOSIS — M179 Osteoarthritis of knee, unspecified: Secondary | ICD-10-CM | POA: Diagnosis not present

## 2015-04-09 DIAGNOSIS — E669 Obesity, unspecified: Secondary | ICD-10-CM | POA: Diagnosis not present

## 2015-04-09 DIAGNOSIS — R52 Pain, unspecified: Secondary | ICD-10-CM | POA: Diagnosis not present

## 2015-07-01 DIAGNOSIS — G8929 Other chronic pain: Secondary | ICD-10-CM | POA: Diagnosis not present

## 2015-07-01 DIAGNOSIS — B182 Chronic viral hepatitis C: Secondary | ICD-10-CM | POA: Diagnosis not present

## 2015-07-01 DIAGNOSIS — B20 Human immunodeficiency virus [HIV] disease: Secondary | ICD-10-CM | POA: Diagnosis not present

## 2015-07-01 DIAGNOSIS — M25569 Pain in unspecified knee: Secondary | ICD-10-CM | POA: Diagnosis not present

## 2015-07-21 DIAGNOSIS — M25562 Pain in left knee: Secondary | ICD-10-CM | POA: Diagnosis not present

## 2015-07-21 DIAGNOSIS — M25561 Pain in right knee: Secondary | ICD-10-CM | POA: Diagnosis not present

## 2015-07-21 DIAGNOSIS — M25462 Effusion, left knee: Secondary | ICD-10-CM | POA: Diagnosis not present

## 2015-07-21 DIAGNOSIS — G8929 Other chronic pain: Secondary | ICD-10-CM | POA: Diagnosis not present

## 2015-07-21 DIAGNOSIS — M25569 Pain in unspecified knee: Secondary | ICD-10-CM | POA: Diagnosis not present

## 2015-07-21 DIAGNOSIS — M170 Bilateral primary osteoarthritis of knee: Secondary | ICD-10-CM | POA: Diagnosis not present

## 2015-07-21 DIAGNOSIS — M25461 Effusion, right knee: Secondary | ICD-10-CM | POA: Diagnosis not present

## 2015-10-21 DIAGNOSIS — B20 Human immunodeficiency virus [HIV] disease: Secondary | ICD-10-CM | POA: Diagnosis not present

## 2015-10-21 DIAGNOSIS — Z23 Encounter for immunization: Secondary | ICD-10-CM | POA: Diagnosis not present

## 2015-10-21 DIAGNOSIS — B182 Chronic viral hepatitis C: Secondary | ICD-10-CM | POA: Diagnosis not present

## 2015-10-21 DIAGNOSIS — M179 Osteoarthritis of knee, unspecified: Secondary | ICD-10-CM | POA: Diagnosis not present

## 2015-11-06 DIAGNOSIS — I5032 Chronic diastolic (congestive) heart failure: Secondary | ICD-10-CM | POA: Diagnosis not present

## 2015-11-06 DIAGNOSIS — I359 Nonrheumatic aortic valve disorder, unspecified: Secondary | ICD-10-CM | POA: Diagnosis not present

## 2015-11-06 DIAGNOSIS — Z0000 Encounter for general adult medical examination without abnormal findings: Secondary | ICD-10-CM | POA: Diagnosis not present

## 2015-11-06 DIAGNOSIS — I25119 Atherosclerotic heart disease of native coronary artery with unspecified angina pectoris: Secondary | ICD-10-CM | POA: Diagnosis not present

## 2015-11-06 DIAGNOSIS — I700 Atherosclerosis of aorta: Secondary | ICD-10-CM | POA: Diagnosis not present

## 2015-11-06 DIAGNOSIS — R9431 Abnormal electrocardiogram [ECG] [EKG]: Secondary | ICD-10-CM | POA: Diagnosis not present

## 2015-11-06 DIAGNOSIS — I110 Hypertensive heart disease with heart failure: Secondary | ICD-10-CM | POA: Diagnosis not present

## 2015-11-06 DIAGNOSIS — I349 Nonrheumatic mitral valve disorder, unspecified: Secondary | ICD-10-CM | POA: Diagnosis not present

## 2015-12-18 DIAGNOSIS — N200 Calculus of kidney: Secondary | ICD-10-CM | POA: Diagnosis not present

## 2015-12-18 DIAGNOSIS — K8020 Calculus of gallbladder without cholecystitis without obstruction: Secondary | ICD-10-CM | POA: Diagnosis not present

## 2015-12-18 DIAGNOSIS — K828 Other specified diseases of gallbladder: Secondary | ICD-10-CM | POA: Diagnosis not present

## 2015-12-18 DIAGNOSIS — M5116 Intervertebral disc disorders with radiculopathy, lumbar region: Secondary | ICD-10-CM | POA: Diagnosis not present

## 2015-12-18 DIAGNOSIS — N281 Cyst of kidney, acquired: Secondary | ICD-10-CM | POA: Diagnosis not present

## 2015-12-18 DIAGNOSIS — M4686 Other specified inflammatory spondylopathies, lumbar region: Secondary | ICD-10-CM | POA: Diagnosis not present

## 2015-12-18 DIAGNOSIS — M5117 Intervertebral disc disorders with radiculopathy, lumbosacral region: Secondary | ICD-10-CM | POA: Diagnosis not present

## 2015-12-18 DIAGNOSIS — M4316 Spondylolisthesis, lumbar region: Secondary | ICD-10-CM | POA: Diagnosis not present

## 2015-12-18 DIAGNOSIS — K7689 Other specified diseases of liver: Secondary | ICD-10-CM | POA: Diagnosis not present

## 2015-12-18 DIAGNOSIS — M5136 Other intervertebral disc degeneration, lumbar region: Secondary | ICD-10-CM | POA: Diagnosis not present

## 2015-12-18 DIAGNOSIS — M4696 Unspecified inflammatory spondylopathy, lumbar region: Secondary | ICD-10-CM | POA: Diagnosis not present

## 2015-12-18 DIAGNOSIS — B182 Chronic viral hepatitis C: Secondary | ICD-10-CM | POA: Diagnosis not present

## 2015-12-22 DIAGNOSIS — B1920 Unspecified viral hepatitis C without hepatic coma: Secondary | ICD-10-CM | POA: Insufficient documentation

## 2016-01-20 DIAGNOSIS — Z21 Asymptomatic human immunodeficiency virus [HIV] infection status: Secondary | ICD-10-CM | POA: Diagnosis not present

## 2016-01-20 DIAGNOSIS — I10 Essential (primary) hypertension: Secondary | ICD-10-CM | POA: Diagnosis not present

## 2016-01-20 DIAGNOSIS — B1920 Unspecified viral hepatitis C without hepatic coma: Secondary | ICD-10-CM | POA: Diagnosis not present

## 2016-01-20 DIAGNOSIS — Z79891 Long term (current) use of opiate analgesic: Secondary | ICD-10-CM | POA: Diagnosis not present

## 2016-01-20 DIAGNOSIS — K7469 Other cirrhosis of liver: Secondary | ICD-10-CM | POA: Diagnosis not present

## 2016-01-20 DIAGNOSIS — Z7952 Long term (current) use of systemic steroids: Secondary | ICD-10-CM | POA: Diagnosis not present

## 2016-01-20 DIAGNOSIS — Z79899 Other long term (current) drug therapy: Secondary | ICD-10-CM | POA: Diagnosis not present

## 2016-01-20 DIAGNOSIS — G894 Chronic pain syndrome: Secondary | ICD-10-CM | POA: Diagnosis not present

## 2016-01-20 DIAGNOSIS — G8929 Other chronic pain: Secondary | ICD-10-CM | POA: Diagnosis not present

## 2016-01-20 DIAGNOSIS — Z23 Encounter for immunization: Secondary | ICD-10-CM | POA: Diagnosis not present

## 2016-01-20 DIAGNOSIS — K7460 Unspecified cirrhosis of liver: Secondary | ICD-10-CM | POA: Diagnosis not present

## 2016-01-20 DIAGNOSIS — Z880 Allergy status to penicillin: Secondary | ICD-10-CM | POA: Diagnosis not present

## 2016-02-05 DIAGNOSIS — M170 Bilateral primary osteoarthritis of knee: Secondary | ICD-10-CM | POA: Diagnosis not present

## 2016-02-05 DIAGNOSIS — M25562 Pain in left knee: Secondary | ICD-10-CM | POA: Diagnosis not present

## 2016-02-05 DIAGNOSIS — M25561 Pain in right knee: Secondary | ICD-10-CM | POA: Diagnosis not present

## 2016-03-08 ENCOUNTER — Other Ambulatory Visit: Payer: Self-pay | Admitting: Orthopedic Surgery

## 2016-03-08 DIAGNOSIS — M1711 Unilateral primary osteoarthritis, right knee: Secondary | ICD-10-CM

## 2016-03-30 DIAGNOSIS — M5126 Other intervertebral disc displacement, lumbar region: Secondary | ICD-10-CM | POA: Diagnosis not present

## 2016-03-30 DIAGNOSIS — Z1231 Encounter for screening mammogram for malignant neoplasm of breast: Secondary | ICD-10-CM | POA: Diagnosis not present

## 2016-03-30 DIAGNOSIS — R0602 Shortness of breath: Secondary | ICD-10-CM | POA: Diagnosis not present

## 2016-03-30 DIAGNOSIS — K7469 Other cirrhosis of liver: Secondary | ICD-10-CM | POA: Diagnosis not present

## 2016-03-30 DIAGNOSIS — B182 Chronic viral hepatitis C: Secondary | ICD-10-CM | POA: Diagnosis not present

## 2016-03-30 DIAGNOSIS — B1920 Unspecified viral hepatitis C without hepatic coma: Secondary | ICD-10-CM | POA: Diagnosis not present

## 2016-03-30 DIAGNOSIS — Z21 Asymptomatic human immunodeficiency virus [HIV] infection status: Secondary | ICD-10-CM | POA: Diagnosis not present

## 2016-03-30 DIAGNOSIS — M179 Osteoarthritis of knee, unspecified: Secondary | ICD-10-CM | POA: Diagnosis not present

## 2016-04-13 ENCOUNTER — Ambulatory Visit
Admission: RE | Admit: 2016-04-13 | Discharge: 2016-04-13 | Disposition: A | Discharge status: Home / Self Care | Payer: Medicare HMO | Source: Ambulatory Visit | Attending: Orthopedic Surgery | Admitting: Orthopedic Surgery

## 2016-04-13 DIAGNOSIS — M1711 Unilateral primary osteoarthritis, right knee: Secondary | ICD-10-CM | POA: Diagnosis not present

## 2016-04-13 DIAGNOSIS — M179 Osteoarthritis of knee, unspecified: Secondary | ICD-10-CM | POA: Diagnosis not present

## 2016-04-21 DIAGNOSIS — M1711 Unilateral primary osteoarthritis, right knee: Secondary | ICD-10-CM | POA: Diagnosis not present

## 2016-04-21 DIAGNOSIS — R0602 Shortness of breath: Secondary | ICD-10-CM | POA: Diagnosis not present

## 2016-04-21 DIAGNOSIS — M1712 Unilateral primary osteoarthritis, left knee: Secondary | ICD-10-CM | POA: Diagnosis not present

## 2016-05-17 DIAGNOSIS — G8929 Other chronic pain: Secondary | ICD-10-CM | POA: Insufficient documentation

## 2016-05-17 DIAGNOSIS — Z01810 Encounter for preprocedural cardiovascular examination: Secondary | ICD-10-CM | POA: Diagnosis not present

## 2016-05-17 DIAGNOSIS — R9431 Abnormal electrocardiogram [ECG] [EKG]: Secondary | ICD-10-CM | POA: Diagnosis not present

## 2016-05-17 DIAGNOSIS — I10 Essential (primary) hypertension: Secondary | ICD-10-CM | POA: Diagnosis not present

## 2016-05-17 DIAGNOSIS — Z01818 Encounter for other preprocedural examination: Secondary | ICD-10-CM | POA: Diagnosis not present

## 2016-05-27 DIAGNOSIS — Z01818 Encounter for other preprocedural examination: Secondary | ICD-10-CM | POA: Diagnosis not present

## 2016-06-03 DIAGNOSIS — R9431 Abnormal electrocardiogram [ECG] [EKG]: Secondary | ICD-10-CM | POA: Diagnosis not present

## 2016-06-03 DIAGNOSIS — I10 Essential (primary) hypertension: Secondary | ICD-10-CM | POA: Diagnosis not present

## 2016-06-03 DIAGNOSIS — Z01810 Encounter for preprocedural cardiovascular examination: Secondary | ICD-10-CM | POA: Diagnosis not present

## 2016-06-16 ENCOUNTER — Inpatient Hospital Stay: Admission: RE | Admit: 2016-06-16 | Payer: Self-pay | Source: Ambulatory Visit

## 2016-06-23 ENCOUNTER — Encounter
Admission: RE | Admit: 2016-06-23 | Discharge: 2016-06-23 | Disposition: A | Discharge status: Home / Self Care | Payer: Commercial Managed Care - HMO | Source: Ambulatory Visit | Attending: Orthopedic Surgery | Admitting: Orthopedic Surgery

## 2016-06-23 DIAGNOSIS — Z01812 Encounter for preprocedural laboratory examination: Secondary | ICD-10-CM | POA: Insufficient documentation

## 2016-06-23 HISTORY — DX: Polyneuropathy, unspecified: G62.9

## 2016-06-23 HISTORY — DX: Inflammatory liver disease, unspecified: K75.9

## 2016-06-23 HISTORY — DX: Gastro-esophageal reflux disease without esophagitis: K21.9

## 2016-06-23 HISTORY — DX: Other specified postprocedural states: R11.2

## 2016-06-23 HISTORY — DX: Other specified postprocedural states: Z98.890

## 2016-06-23 HISTORY — DX: Human immunodeficiency virus (HIV) disease: B20

## 2016-06-23 HISTORY — DX: Essential (primary) hypertension: I10

## 2016-06-23 HISTORY — DX: Anxiety disorder, unspecified: F41.9

## 2016-06-23 HISTORY — DX: Asymptomatic human immunodeficiency virus (hiv) infection status: Z21

## 2016-06-23 LAB — CBC
HCT: 42.7 % (ref 35.0–47.0)
Hemoglobin: 14.8 g/dL (ref 12.0–16.0)
MCH: 33.9 pg (ref 26.0–34.0)
MCHC: 34.7 g/dL (ref 32.0–36.0)
MCV: 97.6 fL (ref 80.0–100.0)
PLATELETS: 251 10*3/uL (ref 150–440)
RBC: 4.37 MIL/uL (ref 3.80–5.20)
RDW: 15.3 % — AB (ref 11.5–14.5)
WBC: 9.1 10*3/uL (ref 3.6–11.0)

## 2016-06-23 LAB — URINALYSIS COMPLETE WITH MICROSCOPIC (ARMC ONLY)
BACTERIA UA: NONE SEEN
BILIRUBIN URINE: NEGATIVE
Glucose, UA: NEGATIVE mg/dL
Hgb urine dipstick: NEGATIVE
KETONES UR: NEGATIVE mg/dL
Nitrite: NEGATIVE
PH: 6 (ref 5.0–8.0)
Protein, ur: NEGATIVE mg/dL
RBC / HPF: <1 RBC/hpf (ref 0–5)
Specific Gravity, Urine: 1.01 (ref 1.005–1.030)

## 2016-06-23 LAB — APTT: APTT: 30 s (ref 24–36)

## 2016-06-23 LAB — BASIC METABOLIC PANEL
ANION GAP: 7 (ref 5–15)
BUN: 11 mg/dL (ref 6–20)
CALCIUM: 9.6 mg/dL (ref 8.9–10.3)
CO2: 30 mmol/L (ref 22–32)
CREATININE: 0.73 mg/dL (ref 0.44–1.00)
Chloride: 102 mmol/L (ref 101–111)
Glucose, Bld: 85 mg/dL (ref 65–99)
Potassium: 3.5 mmol/L (ref 3.5–5.1)
SODIUM: 139 mmol/L (ref 135–145)

## 2016-06-23 LAB — PROTIME-INR
INR: 1.09
PROTHROMBIN TIME: 14.1 s (ref 11.4–15.2)

## 2016-06-23 LAB — SEDIMENTATION RATE: SED RATE: 26 mm/h (ref 0–30)

## 2016-06-23 LAB — TYPE AND SCREEN
ABO/RH(D): B POS
ANTIBODY SCREEN: NEGATIVE

## 2016-06-23 LAB — SURGICAL PCR SCREEN
MRSA, PCR: POSITIVE — AB
STAPHYLOCOCCUS AUREUS: POSITIVE — AB

## 2016-06-23 NOTE — Patient Instructions (Signed)
Your procedure is scheduled on: Tuesday 06/29/16 Report to Day Surgery. 2ND FLOOR MEDICAL MALL ENTRANCE To find out your arrival time please call [phone] between 1PM - 3PM on Monday 06/28/16.  Remember: Instructions that are not followed completely may result in serious medical risk, up to and including death, or upon the discretion of your surgeon and anesthesiologist your surgery may need to be rescheduled.    __X__ 1. Do not eat food or drink liquids after midnight. No gum chewing or hard candies.     __X__ 2. No Alcohol for 24 hours before or after surgery.    ____ 3. Bring all medications with you on the day of surgery if instructed.    __X__ 4. Notify your doctor if there is any change in your medical condition     (cold, fever, infections).     Do not wear jewelry, make-up, hairpins, clips or nail polish.  Do not wear lotions, powders, or perfumes.   Do not shave 48 hours prior to surgery. Men may shave face and neck.  Do not bring valuables to the hospital.    [facility] is not responsible for any belongings or valuables.  NO SMOKING              Contacts, dentures or bridgework may not be worn into surgery.  Leave your suitcase in the car. After surgery it may be brought to your room.  For patients admitted to the hospital, discharge time is determined by your                treatment team.   Patients discharged the day of surgery will not be allowed to drive home.   Please read over the following fact sheets that you were given:   MRSA Information and Surgical Site Infection Prevention   __X__ Take these medicines the morning of surgery with A SIP OF WATER:    1. AMITRIPTYLINE  2. GABAPENTIN  3.   4.  5.  6.  ____ Fleet Enema (as directed)   __X__ Use CHG Soap as directed  ____ Use inhalers on the day of surgery  ____ Stop metformin 2 days prior to surgery    ____ Take 1/2 of usual insulin dose the night before surgery and none on the morning of surgery.    __X__ Stop Coumadin/Plavix/aspirin on TODAY  ____ Stop Anti-inflammatories on    ____ Stop supplements until after surgery.    ____ Bring C-Pap to the hospital.

## 2016-06-24 LAB — URINE CULTURE: CULTURE: NO GROWTH

## 2016-06-24 NOTE — Pre-Procedure Instructions (Signed)
Positive MRSA and Staph.  Results faxed to Dr. Rudene Christians office.

## 2016-06-28 NOTE — Pre-Procedure Instructions (Signed)
ANESTHESIA -  CARDIOLOGY NOTES PATIENT SHOULD BE LOW/ACCEPTABLE RISK FOR TKR (FULL NOTE AT BOTTOM)  ECG 12-lead6/19/2017 [place] Component Name Value Ref Range  Vent Rate (bpm) 95   PR Interval (msec) 182   QRS Interval (msec) 84   QT Interval (msec) 340   QTc (msec) 427   Result Narrative  Normal sinus rhythm Possible Left atrial enlargement Left axis deviation Septal infarct , age undetermined Abnormal ECG No previous ECGs available I reviewed and concur with this report. Electronically signed [id number], MD, Cristie Hem [id number]) on 05/21/2016 10:53:30 AM   NM myocardial perfusion SPECT multiple (stress and rest)05/27/2016 [place] Result Impression   1.Normal left ventricular function 2.Normal wall motion 3.No evidence for scar or ischemia  Result Narrative  CARDIOLOGY DEPARTMENT [facility] [place], [id number]  Procedure: Pharmacologic Myocardial Perfusion Imaging ONE day procedure  Indication: Preop examination Plan: NM myocardial perfusion SPECT multiple (stress  and rest), ECG stress test only  Ordering Physician:   Dr. Isaias Cowman   Clinical History: 65 y.o. year old female Vitals: Height: 68 in Weight: 209 lb Cardiac risk factors include:  HCV, HIV, Smoking, HTN and Obesity    Procedure:  Pharmacologic stress testing was performed with Regadenoson using a single  use 0.4mg /26ml (0.08 mg/ml) prefilled syringe intravenously infused as a  bolus dose. The stress test was stopped due to Infusion completion.Blood  pressure response was normal.  Rest HR: 94bpm Rest BP: 156/67mmHg Max HR: 100bpm Min BP: 164/36mmHg  Stress Test Administered by: Oswald Hillock, CMA  ECG Interpretation: Rest [id number] sinus rhythm, none Stress [id number] sinus rhythm,  Recovery [id number] sinus rhythm ECG  Interpretation:non-diagnostic due to pharmacologic testing.   Administrations This Visit  regadenoson (LEXISCAN) 0.4 mg/5 mL inj syringe 0.4 mg  Admin Date Action Dose Route Administered By      05/27/2016 Given 0.4 mg Intravenous Kingsley Callander, CNMT      technetium Tc41m sestamibi (CARDIOLITE) injection [id number] millicurie  Admin Date Action Dose Route Administered By      [id number] Given [id number] millicurie Intravenous Kingsley Callander, CNMT      technetium Tc37m sestamibi (CARDIOLITE) injection [id number] millicurie  Admin Date Action Dose Route Administered By      [id number] Given [id number] millicurie Intravenous Kingsley Callander, CNMT        Gated post-stress perfusion imaging was performed 30 minutes after stress.  Rest images were performed 30 minutes after injection.  Gated LV Analysis:  TID Ratio:   LVEF= 64%  FINDINGS: Regional wall motion:reveals normal myocardial thickening and wall  motion. The overall quality of the study is good. Artifacts noted: no Left ventricular cavity: normal.  Perfusion Analysis:SPECT images demonstrate homogeneous tracer  distribution throughout the myocardium.   Progress Notes - in this encounter  Isaias Cowman, MD - 06/03/2016 11:45 AM EDT Formatting of this note may be different from the original. Established Patient Visit   Chief Complaint: Chief Complaint  Patient presents with  . Follow-up  stress test  Date of Service: 06/03/2016 Date of Birth: 1951/08/31 PCP: Danice Goltz, MD  History of Present Illness: Ms. Haberle is a 65 y.o.female patient who returns for preoperative cardiovascular evaluation and essential hypertension. Patient is awaiting right total knee replacement surgery. Preoperative ECG revealed normal sinus rhythm with old septal myocardial infarction. She denies chest pain. She does have chronic exertional dyspnea due to underlying COPD and ongoing tobacco abuse.  The patient  continues smokes half pack cigarettes per day. Denies palpitations or heart racing. She denies peripheral edema. She underwent Lexiscan sestamibi study on 05/27/2016. Gated scintigraphy revealed LV ejection fraction of 64%. SPECT analysis did not reveal evidence for scar or ischemia.  The patient has essential hypertension, blood pressure well controlled on amlodipine and hydrochlorothiazide, which are well tolerated without apparent side effects. The patient follows a low-sodium, no added salt diet.  Past Medical and Surgical History  Past Medical History Past Medical History:  Diagnosis Date  . HCV (hepatitis C virus)  . HIV infection (CMS-HCC)  . Hypertension   Past Surgical History She has a past surgical history that includes back surgery (1993).   Medications and Allergies  Current Medications  Current Outpatient Prescriptions  Medication Sig Dispense Refill  . abacavir-dolutegravir-lamiVUDine (TRIUMEQ) 600-50-300 mg tablet Take 1 tablet by mouth once daily.  Marland Kitchen amitriptyline (ELAVIL) 25 MG tablet Take 1 tablet by mouth nightly.  Marland Kitchen amLODIPine (NORVASC) 10 MG tablet Take 1 tablet by mouth once daily.  Marland Kitchen aspirin-sod bicarb-citric acid (EFFERVESCENT PAIN RELIEF) 324 mg TbEF tablet  . calcium carbonate (TUMS) 200 mg calcium (500 mg) chewable tablet  . citalopram (CELEXA) 10 MG tablet Take 1 tablet by mouth once daily. 0  . gabapentin (NEURONTIN) 300 MG capsule Take 900 mg by mouth 3 (three) times a day.  . hydroCHLOROthiazide (HYDRODIURIL) 25 MG tablet Take 25 mg by mouth once daily.  . hydrocortisone 1 % ointment Apply topically.  . mirtazapine (REMERON) 30 MG tablet Take 30 mg by mouth.  . terbinafine HCl (LAMISIL) 250 mg tablet Take 250 mg by mouth.  . traMADol (ULTRAM) 50 mg tablet Take 50 mg by mouth 2 (two) times daily.   No current facility-administered medications for this visit.   Allergies: Penicillins  Social and Family History  Social History reports  that she has been smoking. She has never used smokeless tobacco. She reports that she does not drink alcohol.  Family History Family History  Problem Relation Age of Onset  . No Known Problems Mother  . No Known Problems Father   Review of Systems   Review of Systems: The patient denies chest pain, which chronic exertional shortness of breath, without orthopnea, paroxysmal nocturnal dyspnea, pedal edema, palpitations, heart racing, presyncope, syncope. Review of 12 Systems is negative except as described above.  Physical Examination   Vitals:BP (P) 138/70  Pulse (P) 100  Ht (P) 160 cm (5\' 3" )  Wt (P) 93.1 kg (205 lb 3.2 oz)  BMI (P) 36.35 kg/m2 Ht:(P) 160 cm (5\' 3" ) Wt:(P) 93.1 kg (205 lb 3.2 oz) [id number] surface area is 2.03 meters squared (pended). Body mass index is 36.35 kg/(m^2) (pended).  HEENT: Pupils equally reactive to light and accomodation  Neck: Supple without thyromegaly, carotid pulses 2+ Lungs: clear to auscultation bilaterally; no wheezes, rales, rhonchi Heart: Regular rate and rhythm. No gallops, murmurs or rub Abdomen: soft nontender, nondistended, with normal bowel sounds Extremities: no cyanosis, clubbing, or edema Peripheral Pulses: 2+ in all extremities, 2+ femoral pulses bilaterally  Assessment   65 y.o. female with  1. Essential hypertension  2. Preop cardiovascular exam  3. Abnormal ECG   65 year old female referred for preoperative cardiovascular evaluation prior to right total knee replacement surgery. Preoperative ECG is borderline abnormal. Lexiscan sestamibi study revealed normal left ventricular function without evidence for scar or ischemia. The patient should be at low and acceptable risk for right total knee replacement surgery. The patient has essential hypertension, blood  pressure well controlled on current BP medications.  Plan   1. Continue current medications 2. Counseled patient about low-sodium diet 3. DASH diet printed instructions  given to the patient 4. Encourage patient to stop smoking 5. Proceed with right total knee replacement surgery as scheduled 6. Return to clinic for follow-up in 6 months  No orders of the defined types were placed in this encounter.  Return in about 6 months (around 12/04/2016).  Isaias Cowman, MD

## 2016-07-02 DIAGNOSIS — I2109 ST elevation (STEMI) myocardial infarction involving other coronary artery of anterior wall: Secondary | ICD-10-CM | POA: Diagnosis not present

## 2016-07-02 DIAGNOSIS — Z01818 Encounter for other preprocedural examination: Secondary | ICD-10-CM | POA: Diagnosis not present

## 2016-07-15 ENCOUNTER — Inpatient Hospital Stay
Admission: RE | Admit: 2016-07-15 | Discharge: 2016-07-19 | DRG: 470 | Disposition: A | Discharge status: Skilled Nursing Facility | Payer: Commercial Managed Care - HMO | Source: Ambulatory Visit | Attending: Orthopedic Surgery | Admitting: Orthopedic Surgery

## 2016-07-15 ENCOUNTER — Inpatient Hospital Stay: Payer: Commercial Managed Care - HMO | Admitting: Anesthesiology

## 2016-07-15 ENCOUNTER — Encounter
Admission: RE | Disposition: A | Discharge status: Skilled Nursing Facility | Payer: Self-pay | Source: Ambulatory Visit | Attending: Orthopedic Surgery

## 2016-07-15 ENCOUNTER — Inpatient Hospital Stay: Payer: Commercial Managed Care - HMO

## 2016-07-15 ENCOUNTER — Encounter: Payer: Self-pay | Admitting: *Deleted

## 2016-07-15 DIAGNOSIS — G8918 Other acute postprocedural pain: Secondary | ICD-10-CM

## 2016-07-15 DIAGNOSIS — M1711 Unilateral primary osteoarthritis, right knee: Secondary | ICD-10-CM | POA: Diagnosis not present

## 2016-07-15 DIAGNOSIS — Z21 Asymptomatic human immunodeficiency virus [HIV] infection status: Secondary | ICD-10-CM | POA: Diagnosis not present

## 2016-07-15 DIAGNOSIS — M179 Osteoarthritis of knee, unspecified: Secondary | ICD-10-CM | POA: Diagnosis not present

## 2016-07-15 DIAGNOSIS — M6281 Muscle weakness (generalized): Secondary | ICD-10-CM | POA: Diagnosis not present

## 2016-07-15 DIAGNOSIS — I10 Essential (primary) hypertension: Secondary | ICD-10-CM | POA: Diagnosis not present

## 2016-07-15 DIAGNOSIS — Z96641 Presence of right artificial hip joint: Secondary | ICD-10-CM | POA: Diagnosis not present

## 2016-07-15 DIAGNOSIS — Z880 Allergy status to penicillin: Secondary | ICD-10-CM | POA: Diagnosis not present

## 2016-07-15 DIAGNOSIS — K219 Gastro-esophageal reflux disease without esophagitis: Secondary | ICD-10-CM | POA: Diagnosis present

## 2016-07-15 DIAGNOSIS — B20 Human immunodeficiency virus [HIV] disease: Secondary | ICD-10-CM | POA: Diagnosis not present

## 2016-07-15 DIAGNOSIS — E876 Hypokalemia: Secondary | ICD-10-CM | POA: Diagnosis not present

## 2016-07-15 DIAGNOSIS — R488 Other symbolic dysfunctions: Secondary | ICD-10-CM | POA: Diagnosis not present

## 2016-07-15 DIAGNOSIS — G629 Polyneuropathy, unspecified: Secondary | ICD-10-CM | POA: Diagnosis not present

## 2016-07-15 DIAGNOSIS — G609 Hereditary and idiopathic neuropathy, unspecified: Secondary | ICD-10-CM | POA: Diagnosis not present

## 2016-07-15 DIAGNOSIS — F419 Anxiety disorder, unspecified: Secondary | ICD-10-CM | POA: Diagnosis not present

## 2016-07-15 DIAGNOSIS — R000 Tachycardia, unspecified: Secondary | ICD-10-CM | POA: Diagnosis not present

## 2016-07-15 DIAGNOSIS — Z471 Aftercare following joint replacement surgery: Secondary | ICD-10-CM | POA: Diagnosis not present

## 2016-07-15 DIAGNOSIS — E871 Hypo-osmolality and hyponatremia: Secondary | ICD-10-CM | POA: Diagnosis not present

## 2016-07-15 DIAGNOSIS — R41841 Cognitive communication deficit: Secondary | ICD-10-CM | POA: Diagnosis not present

## 2016-07-15 DIAGNOSIS — Z79899 Other long term (current) drug therapy: Secondary | ICD-10-CM | POA: Diagnosis not present

## 2016-07-15 DIAGNOSIS — Z7982 Long term (current) use of aspirin: Secondary | ICD-10-CM | POA: Diagnosis not present

## 2016-07-15 DIAGNOSIS — Z5189 Encounter for other specified aftercare: Secondary | ICD-10-CM | POA: Diagnosis not present

## 2016-07-15 DIAGNOSIS — Z96659 Presence of unspecified artificial knee joint: Secondary | ICD-10-CM | POA: Diagnosis not present

## 2016-07-15 DIAGNOSIS — R262 Difficulty in walking, not elsewhere classified: Secondary | ICD-10-CM | POA: Diagnosis not present

## 2016-07-15 HISTORY — PX: TOTAL KNEE ARTHROPLASTY: SHX125

## 2016-07-15 LAB — TYPE AND SCREEN
ABO/RH(D): B POS
Antibody Screen: NEGATIVE

## 2016-07-15 LAB — CBC
HEMATOCRIT: 42.9 % (ref 35.0–47.0)
Hemoglobin: 14.6 g/dL (ref 12.0–16.0)
MCH: 33.8 pg (ref 26.0–34.0)
MCHC: 34.1 g/dL (ref 32.0–36.0)
MCV: 99.2 fL (ref 80.0–100.0)
PLATELETS: 204 10*3/uL (ref 150–440)
RBC: 4.32 MIL/uL (ref 3.80–5.20)
RDW: 15.7 % — AB (ref 11.5–14.5)
WBC: 8.9 10*3/uL (ref 3.6–11.0)

## 2016-07-15 LAB — CREATININE, SERUM
Creatinine, Ser: 0.9 mg/dL (ref 0.44–1.00)
GFR calc non Af Amer: >60 mL/min (ref >60)

## 2016-07-15 SURGERY — ARTHROPLASTY, KNEE, TOTAL
Anesthesia: Spinal | Site: Knee | Laterality: Right | Wound class: Clean

## 2016-07-15 MED ORDER — MIRTAZAPINE 15 MG PO TABS
30.0000 mg | ORAL_TABLET | Freq: Every day | ORAL | Status: DC
  Administered 2016-07-15 – 2016-07-18 (×4): 30 mg via ORAL
  Filled 2016-07-15 (×4): qty 2

## 2016-07-15 MED ORDER — FENTANYL CITRATE (PF) 100 MCG/2ML IJ SOLN
INTRAMUSCULAR | Status: DC | PRN
  Administered 2016-07-15 (×2): 50 ug via INTRAVENOUS

## 2016-07-15 MED ORDER — MORPHINE SULFATE (PF) 2 MG/ML IV SOLN
2.0000 mg | INTRAVENOUS | Status: DC | PRN
  Administered 2016-07-15 – 2016-07-16 (×3): 2 mg via INTRAVENOUS
  Filled 2016-07-15 (×3): qty 1

## 2016-07-15 MED ORDER — METOCLOPRAMIDE HCL 5 MG/ML IJ SOLN: 5.0000 mg | Freq: Three times a day (TID) | INTRAMUSCULAR | Status: DC | PRN

## 2016-07-15 MED ORDER — BUPIVACAINE LIPOSOME 1.3 % IJ SUSP
INTRAMUSCULAR | Status: DC | PRN
  Administered 2016-07-15: 60 mL

## 2016-07-15 MED ORDER — CLINDAMYCIN PHOSPHATE 900 MG/50ML IV SOLN
900.0000 mg | Freq: Once | INTRAVENOUS | Status: AC
  Administered 2016-07-15: 900 mg via INTRAVENOUS

## 2016-07-15 MED ORDER — LIDOCAINE HCL (CARDIAC) 20 MG/ML IV SOLN
INTRAVENOUS | Status: DC | PRN
  Administered 2016-07-15: 60 mg via INTRAVENOUS

## 2016-07-15 MED ORDER — HYDROCHLOROTHIAZIDE 25 MG PO TABS
25.0000 mg | ORAL_TABLET | Freq: Every day | ORAL | Status: DC
  Administered 2016-07-15 – 2016-07-18 (×4): 25 mg via ORAL
  Filled 2016-07-15 (×4): qty 1

## 2016-07-15 MED ORDER — BUPIVACAINE HCL (PF) 0.5 % IJ SOLN
INTRAMUSCULAR | Status: DC | PRN
  Administered 2016-07-15: 3 mL via INTRATHECAL

## 2016-07-15 MED ORDER — ENOXAPARIN SODIUM 30 MG/0.3ML ~~LOC~~ SOLN
30.0000 mg | Freq: Two times a day (BID) | SUBCUTANEOUS | Status: DC
  Administered 2016-07-16 – 2016-07-19 (×7): 30 mg via SUBCUTANEOUS
  Filled 2016-07-15 (×7): qty 0.3

## 2016-07-15 MED ORDER — CLINDAMYCIN PHOSPHATE 900 MG/50ML IV SOLN
900.0000 mg | Freq: Four times a day (QID) | INTRAVENOUS | Status: AC
  Administered 2016-07-15 – 2016-07-16 (×3): 900 mg via INTRAVENOUS
  Filled 2016-07-15 (×3): qty 50

## 2016-07-15 MED ORDER — ONDANSETRON HCL 4 MG PO TABS: 4.0000 mg | ORAL_TABLET | Freq: Four times a day (QID) | ORAL | Status: DC | PRN

## 2016-07-15 MED ORDER — MENTHOL 3 MG MT LOZG
1.0000 | LOZENGE | OROMUCOSAL | Status: DC | PRN
Start: 2016-07-15 — End: 2016-07-19
  Filled 2016-07-15: qty 9

## 2016-07-15 MED ORDER — SODIUM CHLORIDE 0.9 % IV SOLN
INTRAVENOUS | Status: DC
  Administered 2016-07-18 (×3): via INTRAVENOUS

## 2016-07-15 MED ORDER — MIDAZOLAM HCL 5 MG/5ML IJ SOLN
INTRAMUSCULAR | Status: DC | PRN
  Administered 2016-07-15: 1.5 mg via INTRAVENOUS
  Administered 2016-07-15: 0.5 mg via INTRAVENOUS

## 2016-07-15 MED ORDER — ASPIRIN EC 81 MG PO TBEC
81.0000 mg | DELAYED_RELEASE_TABLET | Freq: Every day | ORAL | Status: DC
  Administered 2016-07-15 – 2016-07-18 (×4): 81 mg via ORAL
  Filled 2016-07-15 (×4): qty 1

## 2016-07-15 MED ORDER — ACETAMINOPHEN 325 MG PO TABS: 650.0000 mg | ORAL_TABLET | Freq: Four times a day (QID) | ORAL | Status: DC | PRN

## 2016-07-15 MED ORDER — METHOCARBAMOL 1000 MG/10ML IJ SOLN
500.0000 mg | Freq: Four times a day (QID) | INTRAVENOUS | Status: DC | PRN
  Filled 2016-07-15: qty 5

## 2016-07-15 MED ORDER — ZOLPIDEM TARTRATE 5 MG PO TABS: 5.0000 mg | ORAL_TABLET | Freq: Every evening | ORAL | Status: DC | PRN

## 2016-07-15 MED ORDER — METHOCARBAMOL 500 MG PO TABS
500.0000 mg | ORAL_TABLET | Freq: Four times a day (QID) | ORAL | Status: DC | PRN
  Administered 2016-07-16 – 2016-07-19 (×9): 500 mg via ORAL
  Filled 2016-07-15 (×8): qty 1

## 2016-07-15 MED ORDER — METOCLOPRAMIDE HCL 10 MG PO TABS: 5.0000 mg | ORAL_TABLET | Freq: Three times a day (TID) | ORAL | Status: DC | PRN

## 2016-07-15 MED ORDER — FAMOTIDINE 20 MG PO TABS
20.0000 mg | ORAL_TABLET | Freq: Once | ORAL | Status: AC
  Administered 2016-07-15: 20 mg via ORAL

## 2016-07-15 MED ORDER — VANCOMYCIN HCL IN DEXTROSE 1-5 GM/200ML-% IV SOLN
INTRAVENOUS | Status: AC
  Administered 2016-07-15: 1000 mg via INTRAVENOUS
  Filled 2016-07-15: qty 200

## 2016-07-15 MED ORDER — SODIUM CHLORIDE 0.9 % IV SOLN
INTRAVENOUS | Status: DC | PRN
  Administered 2016-07-15: 5 ug/min via INTRAVENOUS

## 2016-07-15 MED ORDER — MORPHINE SULFATE 10 MG/ML IJ SOLN
INTRAMUSCULAR | Status: DC | PRN
  Administered 2016-07-15: 10 mg

## 2016-07-15 MED ORDER — CITALOPRAM HYDROBROMIDE 20 MG PO TABS
10.0000 mg | ORAL_TABLET | Freq: Every day | ORAL | Status: DC
  Administered 2016-07-15: 5 mg via ORAL
  Administered 2016-07-16 – 2016-07-18 (×3): 10 mg via ORAL
  Filled 2016-07-15 (×4): qty 1

## 2016-07-15 MED ORDER — POVIDONE-IODINE 10 % EX SOLN
CUTANEOUS | Status: DC | PRN
  Administered 2016-07-15: 1 via TOPICAL

## 2016-07-15 MED ORDER — ONDANSETRON HCL 4 MG/2ML IJ SOLN: 4.0000 mg | Freq: Once | INTRAMUSCULAR | Status: DC | PRN

## 2016-07-15 MED ORDER — BUPIVACAINE-EPINEPHRINE (PF) 0.25% -1:200000 IJ SOLN
INTRAMUSCULAR | Status: DC | PRN
  Administered 2016-07-15: 30 mL via PERINEURAL

## 2016-07-15 MED ORDER — MORPHINE SULFATE (PF) 10 MG/ML IV SOLN
INTRAVENOUS | Status: AC
  Filled 2016-07-15: qty 1

## 2016-07-15 MED ORDER — ACETAMINOPHEN 650 MG RE SUPP: 650.0000 mg | Freq: Four times a day (QID) | RECTAL | Status: DC | PRN

## 2016-07-15 MED ORDER — PHENOL 1.4 % MT LIQD
1.0000 | OROMUCOSAL | Status: DC | PRN
  Filled 2016-07-15: qty 177

## 2016-07-15 MED ORDER — CLINDAMYCIN PHOSPHATE 900 MG/50ML IV SOLN
INTRAVENOUS | Status: AC
  Filled 2016-07-15: qty 50

## 2016-07-15 MED ORDER — MAGNESIUM HYDROXIDE 400 MG/5ML PO SUSP
30.0000 mL | Freq: Every day | ORAL | Status: DC | PRN
  Administered 2016-07-16 – 2016-07-19 (×2): 30 mL via ORAL
  Filled 2016-07-15 (×4): qty 30

## 2016-07-15 MED ORDER — NEOMYCIN-POLYMYXIN B GU 40-200000 IR SOLN
Status: DC | PRN
  Administered 2016-07-15: 16 mL

## 2016-07-15 MED ORDER — VANCOMYCIN HCL IN DEXTROSE 1-5 GM/200ML-% IV SOLN
1000.0000 mg | Freq: Once | INTRAVENOUS | Status: AC
  Administered 2016-07-15: 1000 mg via INTRAVENOUS

## 2016-07-15 MED ORDER — FAMOTIDINE 20 MG PO TABS
ORAL_TABLET | ORAL | Status: AC
  Administered 2016-07-15: 20 mg via ORAL
  Filled 2016-07-15: qty 1

## 2016-07-15 MED ORDER — OXYCODONE HCL 5 MG PO TABS
5.0000 mg | ORAL_TABLET | ORAL | Status: DC | PRN
  Administered 2016-07-15 (×2): 5 mg via ORAL
  Administered 2016-07-16 (×3): 10 mg via ORAL
  Filled 2016-07-15: qty 2
  Filled 2016-07-15 (×2): qty 1
  Filled 2016-07-15 (×2): qty 2
  Filled 2016-07-15: qty 1

## 2016-07-15 MED ORDER — ABACAVIR-DOLUTEGRAVIR-LAMIVUD 600-50-300 MG PO TABS
1.0000 | ORAL_TABLET | Freq: Every day | ORAL | Status: DC
  Administered 2016-07-15 – 2016-07-18 (×4): 1 via ORAL
  Filled 2016-07-15 (×5): qty 1

## 2016-07-15 MED ORDER — LACTATED RINGERS IV SOLN
INTRAVENOUS | Status: DC
Start: 2016-07-15 — End: 2016-07-15
  Administered 2016-07-15: 16:00:00 via INTRAVENOUS
  Administered 2016-07-15: 75 mL/h via INTRAVENOUS

## 2016-07-15 MED ORDER — DOCUSATE SODIUM 100 MG PO CAPS
100.0000 mg | ORAL_CAPSULE | Freq: Two times a day (BID) | ORAL | Status: DC
  Administered 2016-07-15 – 2016-07-19 (×8): 100 mg via ORAL
  Filled 2016-07-15 (×8): qty 1

## 2016-07-15 MED ORDER — ONDANSETRON HCL 4 MG/2ML IJ SOLN: 4.0000 mg | Freq: Four times a day (QID) | INTRAMUSCULAR | Status: DC | PRN

## 2016-07-15 MED ORDER — FENTANYL CITRATE (PF) 100 MCG/2ML IJ SOLN: 25.0000 ug | INTRAMUSCULAR | Status: DC | PRN

## 2016-07-15 MED ORDER — AMLODIPINE BESYLATE 10 MG PO TABS
10.0000 mg | ORAL_TABLET | Freq: Every day | ORAL | Status: DC
  Administered 2016-07-15 – 2016-07-18 (×4): 10 mg via ORAL
  Filled 2016-07-15 (×4): qty 1

## 2016-07-15 MED ORDER — DIPHENHYDRAMINE HCL 12.5 MG/5ML PO ELIX: 12.5000 mg | ORAL_SOLUTION | ORAL | Status: DC | PRN

## 2016-07-15 MED ORDER — MAGNESIUM CITRATE PO SOLN
1.0000 | Freq: Once | ORAL | Status: DC | PRN
  Filled 2016-07-15: qty 296

## 2016-07-15 MED ORDER — PROPOFOL 500 MG/50ML IV EMUL
INTRAVENOUS | Status: DC | PRN
  Administered 2016-07-15: 50 ug/kg/min via INTRAVENOUS

## 2016-07-15 MED ORDER — BISACODYL 5 MG PO TBEC: 5.0000 mg | DELAYED_RELEASE_TABLET | Freq: Every day | ORAL | Status: DC | PRN

## 2016-07-15 MED ORDER — AMITRIPTYLINE HCL 25 MG PO TABS
25.0000 mg | ORAL_TABLET | Freq: Three times a day (TID) | ORAL | Status: DC
  Administered 2016-07-15 – 2016-07-19 (×13): 25 mg via ORAL
  Filled 2016-07-15 (×13): qty 1

## 2016-07-15 MED ORDER — GABAPENTIN 300 MG PO CAPS
900.0000 mg | ORAL_CAPSULE | Freq: Two times a day (BID) | ORAL | Status: DC
  Administered 2016-07-15 – 2016-07-19 (×8): 900 mg via ORAL
  Filled 2016-07-15 (×9): qty 3

## 2016-07-15 SURGICAL SUPPLY — 63 items
BANDAGE ACE 6X5 VEL STRL LF (GAUZE/BANDAGES/DRESSINGS) ×3 IMPLANT
BLADE SAW 1 (BLADE) ×3 IMPLANT
BLADE SAW 1/2 (BLADE) ×3 IMPLANT
BLOCK CUTTING FEMUR 2 RT MED (MISCELLANEOUS) IMPLANT
BLOCK CUTTING TIBIAL 2 RT (MISCELLANEOUS) IMPLANT
BLOCK CUTTING TIBIAL 4 RT MIS (MISCELLANEOUS) IMPLANT
BONE CEMENT GENTAMICIN (Cement) ×6 IMPLANT
CANISTER SUCT 1200ML W/VALVE (MISCELLANEOUS) ×3 IMPLANT
CANISTER SUCT 3000ML (MISCELLANEOUS) ×6 IMPLANT
CAPT KNEE TOTAL 3 ×3 IMPLANT
CATH FOL LEG HOLDER (MISCELLANEOUS) ×3 IMPLANT
CATH TRAY METER 16FR LF (MISCELLANEOUS) ×3 IMPLANT
CEMENT BONE GENTAMICIN 40 (Cement) ×2 IMPLANT
CHLORAPREP W/TINT 26ML (MISCELLANEOUS) ×3 IMPLANT
COOLER POLAR GLACIER W/PUMP (MISCELLANEOUS) ×3 IMPLANT
CUFF TOURN 24 STER (MISCELLANEOUS) IMPLANT
CUFF TOURN 30 STER DUAL PORT (MISCELLANEOUS) ×3 IMPLANT
DRAPE INCISE IOBAN 66X45 STRL (DRAPES) ×6 IMPLANT
DRAPE SHEET LG 3/4 BI-LAMINATE (DRAPES) ×6 IMPLANT
ELECT CAUTERY BLADE 6.4 (BLADE) ×3 IMPLANT
ELECT REM PT RETURN 9FT ADLT (ELECTROSURGICAL) ×3
ELECTRODE REM PT RTRN 9FT ADLT (ELECTROSURGICAL) ×1 IMPLANT
GAUZE PETRO XEROFOAM 1X8 (MISCELLANEOUS) ×3 IMPLANT
GAUZE SPONGE 4X4 12PLY STRL (GAUZE/BANDAGES/DRESSINGS) ×3 IMPLANT
GLOVE BIOGEL PI IND STRL 9 (GLOVE) ×1 IMPLANT
GLOVE BIOGEL PI INDICATOR 9 (GLOVE) ×2
GLOVE INDICATOR 8.0 STRL GRN (GLOVE) ×3 IMPLANT
GLOVE SURG ORTHO 8.0 STRL STRW (GLOVE) ×3 IMPLANT
GLOVE SURG ORTHO 9.0 STRL STRW (GLOVE) ×3 IMPLANT
GOWN SRG 2XL LVL 4 RGLN SLV (GOWNS) ×1 IMPLANT
GOWN STRL NON-REIN 2XL LVL4 (GOWNS) ×2
GOWN STRL REUS W/ TWL LRG LVL3 (GOWN DISPOSABLE) ×1 IMPLANT
GOWN STRL REUS W/ TWL XL LVL3 (GOWN DISPOSABLE) ×1 IMPLANT
GOWN STRL REUS W/TWL LRG LVL3 (GOWN DISPOSABLE) ×2
GOWN STRL REUS W/TWL XL LVL3 (GOWN DISPOSABLE) ×2
HANDPIECE INTERPULSE COAX TIP (DISPOSABLE) ×2
HOOD PEEL AWAY FLYTE STAYCOOL (MISCELLANEOUS) ×6 IMPLANT
IMMBOLIZER KNEE 19 BLUE UNIV (SOFTGOODS) ×3 IMPLANT
KIT PREVENA INCISION MGT20CM45 (CANNISTER) ×3 IMPLANT
KIT RM TURNOVER STRD PROC AR (KITS) ×3 IMPLANT
KNEE MEDACTA TIBIAL/FEMORAL BL (Knees) ×3 IMPLANT
KNIFE SCULPS 14X20 (INSTRUMENTS) ×3 IMPLANT
MEDACTA FEMUR BONE MODEL IMPLANT
NDL SAFETY 18GX1.5 (NEEDLE) ×3 IMPLANT
NEEDLE SPNL 18GX3.5 QUINCKE PK (NEEDLE) ×3 IMPLANT
NEEDLE SPNL 20GX3.5 QUINCKE YW (NEEDLE) ×3 IMPLANT
NS IRRIG 1000ML POUR BTL (IV SOLUTION) ×3 IMPLANT
PACK TOTAL KNEE (MISCELLANEOUS) ×3 IMPLANT
PAD WRAPON POLAR KNEE (MISCELLANEOUS) ×1 IMPLANT
SET HNDPC FAN SPRY TIP SCT (DISPOSABLE) ×1 IMPLANT
SOL .9 NS 3000ML IRR  AL (IV SOLUTION) ×2
SOL .9 NS 3000ML IRR UROMATIC (IV SOLUTION) ×1 IMPLANT
STAPLER SKIN PROX 35W (STAPLE) ×3 IMPLANT
SUCTION FRAZIER HANDLE 10FR (MISCELLANEOUS) ×2
SUCTION TUBE FRAZIER 10FR DISP (MISCELLANEOUS) ×1 IMPLANT
SUT DVC 2 QUILL PDO  T11 36X36 (SUTURE) ×2
SUT DVC 2 QUILL PDO T11 36X36 (SUTURE) ×1 IMPLANT
SUT DVC QUILL MONODERM 30X30 (SUTURE) ×3 IMPLANT
SYR 20CC LL (SYRINGE) ×3 IMPLANT
SYR 50ML LL SCALE MARK (SYRINGE) ×6 IMPLANT
TOWEL OR 17X26 4PK STRL BLUE (TOWEL DISPOSABLE) ×3 IMPLANT
TOWER CARTRIDGE SMART MIX (DISPOSABLE) ×3 IMPLANT
WRAPON POLAR PAD KNEE (MISCELLANEOUS) ×3

## 2016-07-15 NOTE — OR Nursing (Signed)
PCR swab was positive for MRSA and Staph.  Patient was unaware of the positive lab result. She said that the doctors office did not notify here and did not start any Mupirocin ointment.  She had a boil on her abdomen a few months ago that had grown and was full of pus that popped.  Today, the boil is completely healed with just a scar.

## 2016-07-15 NOTE — Anesthesia Procedure Notes (Signed)
Spinal  Patient location during procedure: OR Start time: 07/15/2016 1:57 PM End time: 07/15/2016 2:01 PM Staffing Anesthesiologist: Martha Clan Performed: anesthesiologist  Preanesthetic Checklist Completed: patient identified, site marked, surgical consent, pre-op evaluation, timeout performed, IV checked, risks and benefits discussed and monitors and equipment checked Spinal Block Patient position: sitting Prep: ChloraPrep Patient monitoring: heart rate, continuous pulse ox, blood pressure and cardiac monitor Approach: midline Location: L3-4 Injection technique: single-shot Needle Needle type: Whitacre and Introducer  Needle gauge: 24 G Needle length: 9 cm Additional Notes Negative paresthesia. Negative blood return. Positive free-flowing CSF. Expiration date of kit checked and confirmed. Patient tolerated procedure well, without complications.

## 2016-07-15 NOTE — H&P (Addendum)
Reviewed paper H+P, will be scanned into chart. No changes noted. Lungs clear Heart RRR without murmur

## 2016-07-15 NOTE — Anesthesia Preprocedure Evaluation (Signed)
Anesthesia Evaluation  Patient identified by MRN, date of birth, ID band Patient awake    Reviewed: Allergy & Precautions, H&P , NPO status , Patient's Chart, lab work & pertinent test results, reviewed documented beta blocker date and time   History of Anesthesia Complications (+) PONV and history of anesthetic complications  Airway Mallampati: IV  TM Distance: >3 FB Neck ROM: full    Dental no notable dental hx. (+) Partial Upper   Pulmonary neg shortness of breath, neg sleep apnea, neg COPD, neg recent URI, Current Smoker,    Pulmonary exam normal breath sounds clear to auscultation       Cardiovascular Exercise Tolerance: Good hypertension, (-) angina(-) CAD, (-) Past MI, (-) Cardiac Stents and (-) CABG Normal cardiovascular exam(-) dysrhythmias (-) Valvular Problems/Murmurs Rhythm:regular Rate:Normal     Neuro/Psych neg Seizures  Neuromuscular disease negative psych ROS   GI/Hepatic GERD  ,(+) Hepatitis -, C  Endo/Other  negative endocrine ROS  Renal/GU negative Renal ROS  negative genitourinary   Musculoskeletal   Abdominal   Peds  Hematology negative hematology ROS (+)   Anesthesia Other Findings Past Medical History: No date: Anxiety No date: GERD (gastroesophageal reflux disease) No date: Hepatitis     Comment: Hep C - treated with Harvoni No date: HIV (human immunodeficiency virus infection) (* No date: Hypertension No date: Neuromuscular disorder (HCC)     Comment: toes No date: Neuropathy (HCC) No date: PONV (postoperative nausea and vomiting)   Reproductive/Obstetrics negative OB ROS                             Anesthesia Physical Anesthesia Plan  ASA: III  Anesthesia Plan: Spinal   Post-op Pain Management:    Induction:   Airway Management Planned:   Additional Equipment:   Intra-op Plan:   Post-operative Plan:   Informed Consent: I have reviewed the  patients History and Physical, chart, labs and discussed the procedure including the risks, benefits and alternatives for the proposed anesthesia with the patient or authorized representative who has indicated his/her understanding and acceptance.   Dental Advisory Given  Plan Discussed with: Anesthesiologist, CRNA and Surgeon  Anesthesia Plan Comments:         Anesthesia Quick Evaluation

## 2016-07-15 NOTE — Progress Notes (Signed)
Patient was admitted to room 154 from surgery via room bed. Foley, wouond vac, TEDs, foot pumps, and NSL in place. A&O x4. Family at bedside. Knee controls locked on bed.

## 2016-07-15 NOTE — Transfer of Care (Signed)
Immediate Anesthesia Transfer of Care Note  Patient: Lisa Hawkins  Procedure(s) Performed: Procedure(s): TOTAL KNEE ARTHROPLASTY (Right)  Patient Location: PACU  Anesthesia Type:Spinal  Level of Consciousness: awake  Airway & Oxygen Therapy: Patient Spontanous Breathing and Patient connected to face mask oxygen  Post-op Assessment: Report given to RN and Post -op Vital signs reviewed and stable  Post vital signs: Reviewed  Last Vitals:  Vitals:   07/15/16 1132 07/15/16 1607  BP: (!) 153/100 108/74  Pulse: 98 (!) 101  Resp: 18 14  Temp: 36.9 C 36.2 C    Last Pain:  Vitals:   07/15/16 1132  TempSrc: Oral  PainSc:       Patients Stated Pain Goal: 0 ([id number])  Complications: No apparent anesthesia complications

## 2016-07-15 NOTE — Op Note (Signed)
07/15/2016  4:13 PM  PATIENT:  Lisa Hawkins  65 y.o. female  PRE-OPERATIVE DIAGNOSIS:  DEGENERATIVE OSTEOARTHRITIS right knee  POST-OPERATIVE DIAGNOSIS:  DEGENERATIVE OSTEOARTHRITIS right knee  PROCEDURE:  Procedure(s): TOTAL KNEE ARTHROPLASTY (Right)  SURGEON: Laurene Footman, MD  ASSISTANTS: Rachelle Hora [facility]  ANESTHESIA:   spinal  EBL:  Total I/O In: 1100 [I.V.:1100] Out: 300 [Urine:250; Blood:50]  BLOOD ADMINISTERED:none  DRAINS: none   LOCAL MEDICATIONS USED:  MARCAINE    and OTHER morphine, exparel  SPECIMEN:  No Specimen  DISPOSITION OF SPECIMEN:  N/A  COUNTS:  YES  TOURNIQUET:   Total Tourniquet Time Documented: Thigh (Right) - 79 minutes Total: Thigh (Right) - 79 minutes   IMPLANTS: Medacta GMK sphere system, 2 right femur, 2 tibia with short stem, 12 mm insert, 2 patella all components cemented  DICTATION: .Dragon Dictation patient was brought the operating room and after adequate spinal anesthesia was obtained, the right leg was prepped and draped in sterile fashion with a tourniquet applied the upper thigh. After patient identification and timeout procedures were completed, tourniquet was raised and midline skin incision made. Medial parapatellar arthrotomy is performed and inspection revealed severe medial compartment arthritis with exposed bone medially on tibial and femoral condyles and exposed bone with extensive bone loss of the patella and trochlea lateral compartment was relatively spared with mild degenerative changes. The anterior cruciate ligament and fat pad were excised and the medial side of the of the tibia was elevated to allow for application the Medacta cutting block and correct the varus deformity. Proximal tibia cut was carried out and proximal tibia resected off the menisci. The distal femoral cutting guide was then applied after removing some residual cartilage and the distal femoral cut carried out. The pins were placed and the 2 cutting  guide applied with anterior posterior and chamfer cuts made. The residual PCL was excised at this time as well the tibial trial was then placed with appropriate rotation based on will the pins from the [place] cutting guide. Proximal preparation carried out with drilling and then a keel punch with a 2 femur placed a 12 mm insert gave excellent stability. Distal femoral drill holes were made followed by the trochlear groove cut. The trials were removed and the patella was cut using the patellar cutting guide and measured drilled and a size 2 patella fit well. At this point local was infiltrated as noted above. The bony surfaces were thoroughly irrigated and dried and all components cemented into place with the final polyethylene insert with set screw with each with set were placed with the torque limiting screwdriver. The knee is held in extension as the cement set excess cement was removed. The knee was then irrigated with pulsatile lavage and a dilute Betadine solution prior being closed with heavy Quill to a Quill subcutaneously and skin staples. Provenz wound VAC was then applied to the incision followed by tall and Polar Care  PLAN OF CARE: Admit to inpatient   PATIENT DISPOSITION:  PACU - hemodynamically stable.

## 2016-07-15 NOTE — Anesthesia Postprocedure Evaluation (Signed)
Anesthesia Post Note  Patient: Lisa Hawkins  Procedure(s) Performed: Procedure(s) (LRB): TOTAL KNEE ARTHROPLASTY (Right)  Patient location during evaluation: PACU Anesthesia Type: Spinal Level of consciousness: oriented and awake and alert Pain management: pain level controlled Vital Signs Assessment: post-procedure vital signs reviewed and stable Respiratory status: spontaneous breathing, respiratory function stable and nonlabored ventilation Cardiovascular status: blood pressure returned to baseline and stable Postop Assessment: no headache and no backache Anesthetic complications: no    Last Vitals:  Vitals:   07/15/16 1615 07/15/16 1623  BP:  104/77  Pulse: 100 100  Resp: 16 15  Temp:      Last Pain:  Vitals:   07/15/16 1132  TempSrc: Oral  PainSc:                  Stashia Sia

## 2016-07-16 ENCOUNTER — Encounter: Payer: Self-pay | Admitting: Orthopedic Surgery

## 2016-07-16 LAB — CBC
HEMATOCRIT: 40.5 % (ref 35.0–47.0)
HEMOGLOBIN: 13.8 g/dL (ref 12.0–16.0)
MCH: 33.5 pg (ref 26.0–34.0)
MCHC: 34.2 g/dL (ref 32.0–36.0)
MCV: 98 fL (ref 80.0–100.0)
PLATELETS: 211 10*3/uL (ref 150–440)
RBC: 4.13 MIL/uL (ref 3.80–5.20)
RDW: 15.1 % — ABNORMAL HIGH (ref 11.5–14.5)
WBC: 10.2 10*3/uL (ref 3.6–11.0)

## 2016-07-16 LAB — BASIC METABOLIC PANEL
ANION GAP: 8 (ref 5–15)
BUN: 12 mg/dL (ref 6–20)
CHLORIDE: 102 mmol/L (ref 101–111)
CO2: 24 mmol/L (ref 22–32)
Calcium: 8.5 mg/dL — ABNORMAL LOW (ref 8.9–10.3)
Creatinine, Ser: 0.8 mg/dL (ref 0.44–1.00)
GFR calc Af Amer: >60 mL/min (ref >60)
GLUCOSE: 138 mg/dL — AB (ref 65–99)
POTASSIUM: 3.9 mmol/L (ref 3.5–5.1)
Sodium: 134 mmol/L — ABNORMAL LOW (ref 135–145)

## 2016-07-16 MED ORDER — OXYCODONE HCL ER 15 MG PO T12A
15.0000 mg | EXTENDED_RELEASE_TABLET | Freq: Two times a day (BID) | ORAL | Status: DC
  Administered 2016-07-16 – 2016-07-19 (×7): 15 mg via ORAL
  Filled 2016-07-16 (×7): qty 1

## 2016-07-16 NOTE — Care Management Note (Addendum)
Case Management Note  Patient Details  Name: Lisa Hawkins MRN: [id number] Date of Birth: Mar 18, 1951  Subjective/Objective:      Spoke with patient who is alert and answered questions appropriately. Patient stated that she lives with her "lifelong friend" and that he is able to drive her to her appointments. She normally uses a cane to ambulate. PT has recommended SNF for [facility] but patient absolutely refuses to consider this option. "I am going home" Patient will need a DME rolling walker and also a BSC at discharge. Patient given choice  stated that she would like [facility] provider however [facility] not accept [facility] patients [facility].  Patient will be referred to a provider that will take her insurance.   Discussed with patient and she chose [facility].   Referral placed with Floydene Flock for [facility] and DME. Patient pharmacy is [facility] on [address].     Contacted [facility] who stated patient will go home on Lovenox 40 mg injection  Daily for 14 days no refills. Called to [place] at [facility] [phone]. $0 dollar co pay patient informed.   Action/Plan: Patient refuses HH will discharge home with [facility]. PT CSW.   Expected Discharge Date:  07/17/16               Expected Discharge Plan:  [place]  In-House Referral:  Clinical Social Work  Discharge planning Services  CM Consult  Post Acute Care Choice:  Durable Medical Equipment, Home Health Choice offered to:  Patient  DME Arranged:  3-N-1, [facility]ile DME Agency:     HH Arranged:  PT, Social Work [employer] Agency:     Status of Service:  In process, will continue to follow  If discussed at Long Length of Stay Meetings, dates discussed:    Additional Comments:  Alvie Heidelberg, RN 07/16/2016, 2:43 PM

## 2016-07-16 NOTE — NC FL2 (Signed)
[place] LEVEL OF CARE SCREENING TOOL     IDENTIFICATION  Patient Name: Lisa Hawkins Birthdate: [birth year] Sex: female Admission Date (Current Location): 07/15/2016  [facility] and [place] Number:  Lisa Hawkins  ([id number] [facility]) Facility and Address:  [facility], [address] [place], [place] [address]      Provider Number: [id number]  Attending Physician Name and Address:  Hessie Knows, MD  Relative Name and Phone Number:       Current Level of Care: Hospital Recommended Level of Care: [place] Prior Approval Number:    Date Approved/Denied:   PASRR Number:  ([id number])  Discharge Plan: SNF    Current Diagnoses: Patient Active Problem List   Diagnosis Date Noted  . Primary localized osteoarthritis of right knee 07/15/2016   HIV infection (CMS-HCC)    Hypertension    HCV (hepatitis C virus)       Orientation RESPIRATION BLADDER Height & Weight     Self, Time, Situation, Place  Normal Continent Weight: 209 lb (94.8 kg) Height:  5\' 3"  (160 cm)  BEHAVIORAL SYMPTOMS/MOOD NEUROLOGICAL BOWEL NUTRITION STATUS   (none )  (none) Continent Diet (Regular Diet )  AMBULATORY STATUS COMMUNICATION OF NEEDS Skin   Extensive Assist Verbally Surgical wounds (Incision: Right Knee (Disposiable provena wound vac) )                       Personal Care Assistance Level of Assistance  Bathing, Feeding, Dressing Bathing Assistance: Limited assistance Feeding assistance: Independent Dressing Assistance: Limited assistance     Functional Limitations Info  Sight, Hearing, Speech Sight Info: Adequate Hearing Info: Adequate Speech Info: Adequate    SPECIAL CARE FACTORS FREQUENCY  PT (By licensed PT), OT (By licensed OT)     PT Frequency:  (5) OT Frequency:  (5)            Contractures      Additional Factors Info  Code Status, Allergies, Isolation Precautions Code Status Info:  (Full Code. ) Allergies  Info:  (Penicillins. )     Isolation Precautions Info:  (MRSA Nasal Swab. )     Current Medications (07/16/2016):  This is the current hospital active medication list Current Facility-Administered Medications  Medication Dose Route Frequency Provider Last Rate Last Dose  . 0.9 %  sodium chloride infusion   Intravenous Continuous Hessie Knows, MD 75 mL/hr at 07/16/16 0600    . abacavir-dolutegravir-lamiVUDine (TRIUMEQ) 600-50-300 MG per tablet 1 tablet  1 tablet Oral QHS Hessie Knows, MD   1 tablet at 07/15/16 2027  . acetaminophen (TYLENOL) tablet 650 mg  650 mg Oral Q6H PRN Hessie Knows, MD       Or  . acetaminophen (TYLENOL) suppository 650 mg  650 mg Rectal Q6H PRN Hessie Knows, MD      . amitriptyline (ELAVIL) tablet 25 mg  25 mg Oral TID Hessie Knows, MD   25 mg at 07/16/16 0924  . amLODipine (NORVASC) tablet 10 mg  10 mg Oral QHS Hessie Knows, MD   10 mg at 07/15/16 2026  . aspirin EC tablet 81 mg  81 mg Oral QHS Hessie Knows, MD   81 mg at 07/15/16 2026  . bisacodyl (DULCOLAX) EC tablet 5 mg  5 mg Oral Daily PRN Hessie Knows, MD      . citalopram (CELEXA) tablet 10 mg  10 mg Oral QHS Hessie Knows, MD   5 mg at 07/15/16 2031  . diphenhydrAMINE (BENADRYL)  12.5 MG/5ML elixir 12.5-25 mg  12.5-25 mg Oral Q4H PRN Hessie Knows, MD      . docusate sodium (COLACE) capsule 100 mg  100 mg Oral BID Hessie Knows, MD   100 mg at 07/16/16 [id number]  . enoxaparin (LOVENOX) injection 30 mg  30 mg Subcutaneous Q12H Hessie Knows, MD   30 mg at 07/16/16 0810  . gabapentin (NEURONTIN) capsule 900 mg  900 mg Oral BID Hessie Knows, MD   900 mg at 07/16/16 0924  . hydrochlorothiazide (HYDRODIURIL) tablet 25 mg  25 mg Oral QHS Hessie Knows, MD   25 mg at 07/15/16 2026  . magnesium citrate solution 1 Bottle  1 Bottle Oral Once PRN Hessie Knows, MD      . magnesium hydroxide (MILK OF MAGNESIA) suspension 30 mL  30 mL Oral Daily PRN Hessie Knows, MD      . menthol-cetylpyridinium (CEPACOL) lozenge 3 mg  1 lozenge  Oral PRN Hessie Knows, MD       Or  . phenol (CHLORASEPTIC) mouth spray 1 spray  1 spray Mouth/Throat PRN Hessie Knows, MD      . methocarbamol (ROBAXIN) tablet 500 mg  500 mg Oral Q6H PRN Hessie Knows, MD   500 mg at 07/16/16 1156   Or  . methocarbamol (ROBAXIN) 500 mg in dextrose 5 % 50 mL IVPB  500 mg Intravenous Q6H PRN Hessie Knows, MD      . metoCLOPramide (REGLAN) tablet 5-10 mg  5-10 mg Oral Q8H PRN Hessie Knows, MD       Or  . metoCLOPramide (REGLAN) injection 5-10 mg  5-10 mg Intravenous Q8H PRN Hessie Knows, MD      . mirtazapine (REMERON) tablet 30 mg  30 mg Oral QHS Hessie Knows, MD   30 mg at 07/15/16 2025  . morphine 2 MG/ML injection 2 mg  2 mg Intravenous Q1H PRN Hessie Knows, MD   2 mg at 07/16/16 1156  . ondansetron (ZOFRAN) tablet 4 mg  4 mg Oral Q6H PRN Hessie Knows, MD       Or  . ondansetron [facility]) injection 4 mg  4 mg Intravenous Q6H PRN Hessie Knows, MD      . oxyCODONE (Oxy IR/ROXICODONE) immediate release tablet 5-10 mg  5-10 mg Oral Q3H PRN Hessie Knows, MD   10 mg at 07/16/16 [id number]  . zolpidem (AMBIEN) tablet 5 mg  5 mg Oral QHS PRN Hessie Knows, MD         Discharge Medications: Please see discharge summary for a list of discharge medications.  Relevant Imaging Results:  Relevant Lab Results:   Additional Information  (SSN: [id number])  Lisa Hawkins, Lisa Beets, LCSW

## 2016-07-16 NOTE — Evaluation (Signed)
Physical Therapy Evaluation Patient Details Name: Lisa Hawkins MRN: [id number] DOB: 11-01-1951 Today's Date: 07/16/2016   History of Present Illness  Pt. is a 65 y.o. femal who was admitted for a Right TKR.  Clinical Impression  Pt is highly pain limited and pain focused t/o exam and though she is occasionally able to show some good effort she is very guarded, reactionary and needs excessive encouragement and cuing to perform as appropriate.  She lacks ~10 degrees of extension and is very pain limited with any ROM for flexion.  Pt wants to go home but will needed to make dramatic increases functionally and otherwise for this to be safe and reasonable.    Follow Up Recommendations SNF (pt very much wanting not "to go to no rest home")    Equipment Recommendations       Recommendations for Other Services       Precautions / Restrictions Precautions Precautions: Knee;Fall Restrictions Weight Bearing Restrictions: Yes RLE Weight Bearing: Weight bearing as tolerated      Mobility  Bed Mobility Overal bed mobility: Needs Assistance Bed Mobility: Supine to Sit     Supine to sit: Mod assist (mostly with R LE, some with torso, a lot of cuing)     General bed mobility comments: Pt calling out in pain and needing a lot of encouragement/cuing to get to sitting  Transfers Overall transfer level: Needs assistance Equipment used: Rolling walker (2 wheeled) Transfers: Sit to/from Stand Sit to Stand: Mod assist         General transfer comment: Pt struggles to actually get up from sitting, needed a lot of cuing and encouragement  Ambulation/Gait Ambulation/Gait assistance: Mod assist Ambulation Distance (Feet): 15 Feet Assistive device: Rolling walker (2 wheeled)       General Gait Details: Pt calling out in pain the entire time and generally shows poor awareness and tolerance.  KI donned, very reliant on the walker with slow hesitant gait  Stairs             Wheelchair Mobility    Modified Rankin (Stroke Patients Only)       Balance Overall balance assessment: Needs assistance   Sitting balance-Leahy Scale: Fair       Standing balance-Leahy Scale: Poor                               Pertinent Vitals/Pain Pain Assessment: 0-10 Pain Score: 9  Pain Location: R knee (deep, top and back) Pain Descriptors / Indicators: Throbbing Pain Intervention(s): Limited activity within patient's tolerance;Monitored during session;Repositioned;Patient requesting pain meds-RN notified    Home Living Family/patient expects to be discharged to::  (states she's going home refusing rehab despite likely need) Living Arrangements: Spouse/significant other                    Prior Function Level of Independence: Independent         Comments: Pt reports that she gets out of home for church and weekly bible study but otherwise does little out oft he home     Hand Dominance        Extremity/Trunk Assessment   Upper Extremity Assessment: Overall WFL for tasks assessed           Lower Extremity Assessment:  (R very, very pain limited and generally weak)         Communication   Communication: No difficulties  Cognition Arousal/Alertness: Awake/alert;Lethargic Behavior  During Therapy: WFL for tasks assessed/performed Overall Cognitive Status: Within Functional Limits for tasks assessed                      General Comments      Exercises Total Joint Exercises Ankle Circles/Pumps: AROM;10 reps Quad Sets: Strengthening;10 reps Gluteal Sets: Strengthening;10 reps Short Arc Quad: AROM;10 reps Heel Slides: AAROM;5 reps Hip ABduction/ADduction: AROM;10 reps Knee Flexion: PROM;5 reps Goniometric ROM: 10-50      Assessment/Plan    PT Assessment Patient needs continued PT services  PT Diagnosis Acute pain;Generalized weakness;Difficulty walking   PT Problem List Decreased strength;Decreased  balance;Decreased mobility;Decreased activity tolerance;Decreased cognition;Decreased range of motion;Decreased safety awareness;Decreased knowledge of use of DME;Pain  PT Treatment Interventions DME instruction;Gait training;Stair training;Functional mobility training;Therapeutic activities;Therapeutic exercise;Balance training;Neuromuscular re-education;Cognitive remediation   PT Goals (Current goals can be found in the Care Plan section) Acute Rehab PT Goals Patient Stated Goal: wants to go home PT Goal Formulation: With patient Time For Goal Achievement: 07/30/16 Potential to Achieve Goals: Fair    Frequency BID   Barriers to discharge        Co-evaluation               End of Session Equipment Utilized During Treatment: Gait belt Activity Tolerance: Patient tolerated treatment well Patient left: with call bell/phone within reach;with chair alarm set           Time: [id number] PT Time Calculation (min) (ACUTE ONLY): 44 min   Charges:   PT Evaluation $PT Eval Low Complexity: 1 Procedure PT Treatments $Therapeutic Exercise: 8-22 mins   PT G Codes:        Kreg Shropshire, DPT 07/16/2016, 2:16 PM

## 2016-07-16 NOTE — Progress Notes (Signed)
   Subjective: 1 Day Post-Op Procedure(s) (LRB): TOTAL KNEE ARTHROPLASTY (Right) Patient reports pain as moderate.   Patient is well, and has had no acute complaints or problems Denies any CP, SOB, ABD pain. We will continue therapy today.    Objective: Vital signs in last 24 hours: Temp:  [97.1 F (36.2 C)-99.3 F (37.4 C)] 99.3 F (37.4 C) (08/18 0742) Pulse Rate:  [92-113] 100 (08/18 0742) Resp:  [11-20] 18 (08/18 0742) BP: (102-153)/(35-100) 144/80 (08/18 0742) SpO2:  [89 %-100 %] 89 % (08/18 0742) Weight:  [93.9 kg (207 lb)-94.8 kg (209 lb)] 94.8 kg (209 lb) (08/17 1653)  Intake/Output from previous day: 08/17 0701 - 08/18 0700 In: 2045 [I.V.:2045] Out: 700 [Urine:650; Blood:50] Intake/Output this shift: No intake/output data recorded.   Recent Labs  07/15/16 1151 07/16/16 0528  HGB 14.6 13.8    Recent Labs  07/15/16 1151 07/16/16 0528  WBC 8.9 10.2  RBC 4.32 4.13  HCT 42.9 40.5  PLT 204 211    Recent Labs  07/15/16 1151 07/16/16 0528  NA  --  134*  K  --  3.9  CL  --  102  CO2  --  24  BUN  --  12  CREATININE 0.90 0.80  GLUCOSE  --  138*  CALCIUM  --  8.5*   No results for input(s): LABPT, INR in the last 72 hours.  EXAM General - Patient is Alert, Appropriate and Oriented Extremity - Neurovascular intact Sensation intact distally Intact pulses distally Dorsiflexion/Plantar flexion intact No cellulitis present Compartment soft Dressing - dressing C/D/I and wound vac intact Motor Function - intact, moving foot and toes well on exam.   Past Medical History:  Diagnosis Date  . Anxiety   . GERD (gastroesophageal reflux disease)   . Hepatitis    Hep C - treated with Harvoni  . HIV (human immunodeficiency virus infection) ([place])   . Hypertension   . Neuromuscular disorder (HCC)    toes  . Neuropathy ([place])   . PONV (postoperative nausea and vomiting)     Assessment/Plan:   1 Day Post-Op Procedure(s) (LRB): TOTAL KNEE ARTHROPLASTY  (Right) Active Problems:   Primary localized osteoarthritis of right knee  Estimated body mass index is 37.02 kg/m as calculated from the following:   Height as of this encounter: 5\' 3"  (1.6 m).   Weight as of this encounter: 94.8 kg (209 lb). Advance diet Up with therapy  Needs BM Recheck labs in the am  DVT Prophylaxis - Lovenox, Foot Pumps and TED hose Weight-Bearing as tolerated to right leg   T. Rachelle Hora, PA-C [place] 07/16/2016, 8:24 AM

## 2016-07-16 NOTE — Progress Notes (Signed)
Physical Therapy Treatment Patient Details Name: Lisa Hawkins MRN: [id number] DOB: [birth year] Today's Date: 07/16/2016    History of Present Illness Pt. is a 65 y.o. femal who was admitted for a Right TKR.    PT Comments    Pt is impulsive, highly pain focused and limited and ultimately makes very poor safety decisions t/o the session.  She has a lot of limitations secondary to pain, stiffness and overall decision-making.  She nearly threw herself down while walking secondary to pain and despite much cuing and real talk about focusing and safety she did poorly and was impulsive getting back to the bed.  Pt crying out in pain during abbreviated exercises but still insists that she is going home. Pt understands that she is not safe a this time, but also seemed not to care.   Follow Up Recommendations  SNF     Equipment Recommendations       Recommendations for Other Services       Precautions / Restrictions Precautions Precautions: Knee;Fall Restrictions Weight Bearing Restrictions: Yes RLE Weight Bearing: Weight bearing as tolerated    Mobility  Bed Mobility Overal bed mobility: Needs Assistance Bed Mobility: Sit to Supine     Supine to sit: Mod assist (mostly with R LE, some with torso, a lot of cuing) Sit to supine: Mod assist   General bed mobility comments: Pt needing assist to get LEs up into bed, 1/2 during transition she reaches down to impusively pull off her sock and needed assist to keep from falling back off the bed  Transfers Overall transfer level: Needs assistance Equipment used: Rolling walker (2 wheeled) Transfers: Sit to/from Stand Sit to Stand: Max assist         General transfer comment: Pt needing a lot of assist getting up from recliner.  She phyched herself out of doing it and was impulsive and unsuccessful on the first 2 attempts needing heavy assist and cuing to actually get up.  Ambulation/Gait Ambulation/Gait assistance: Max  assist Ambulation Distance (Feet): 15 Feet Assistive device: Rolling walker (2 wheeled)       General Gait Details: Pt shows extremely poor safety awareness and secondary to pain she impulsively swings hips to the side to try and sit in one of the guest chairs in her room.  Pt only landed one glute on chair with heavy, direct assist from PT to stay off the floor.  Pt showed little overall awareness during the episode and ultimately did poorly with ambulation as well crying out in severe pain t/o the effort and unable to focus on the task at hand or listen to instruction.    Stairs            Wheelchair Mobility    Modified Rankin (Stroke Patients Only)       Balance Overall balance assessment: Needs assistance   Sitting balance-Leahy Scale: Fair       Standing balance-Leahy Scale: Poor                      Cognition Arousal/Alertness: Awake/alert Behavior During Therapy: Impulsive;Anxious Overall Cognitive Status: Within Functional Limits for tasks assessed                      Exercises Total Joint Exercises Ankle Circles/Pumps: AROM;10 reps Quad Sets: Strengthening;10 reps Gluteal Sets: Strengthening;10 reps Short Arc Quad: AROM;10 reps Heel Slides: AAROM;5 reps Hip ABduction/ADduction: AROM;10 reps Knee Flexion: PROM;5 reps Goniometric ROM: remains  limited, not tested secondary to pain    General Comments        Pertinent Vitals/Pain Pain Assessment: 0-10 Pain Score: 9  (Pt crying out in pain nearly the entire session) Pain Location: R knee (deep, top and back) Pain Descriptors / Indicators: Throbbing Pain Intervention(s): Limited activity within patient's tolerance;Monitored during session;Repositioned;Patient requesting pain meds-RN notified    Home Living Family/patient expects to be discharged to::  (states she's going home refusing rehab despite likely need) Living Arrangements: Spouse/significant other                   Prior Function Level of Independence: Independent      Comments: Pt reports that she gets out of home for church and weekly bible study but otherwise does little out oft he home   PT Goals (current goals can now be found in the care plan section) Acute Rehab PT Goals Patient Stated Goal: wants to go home PT Goal Formulation: With patient Time For Goal Achievement: 07/30/16 Potential to Achieve Goals: Fair Progress towards PT goals: PT to reassess next treatment    Frequency  BID    PT Plan Current plan remains appropriate    Co-evaluation             End of Session Equipment Utilized During Treatment: Gait belt Activity Tolerance: Patient tolerated treatment well Patient left: with call bell/phone within reach;with chair alarm set     Time: [id number] PT Time Calculation (min) (ACUTE ONLY): 27 min  Charges:  $Gait Training: 8-22 mins $Therapeutic Exercise: 8-22 mins                    G Codes:      Kreg Shropshire, DPT 07/16/2016, 2:27 PM

## 2016-07-16 NOTE — Clinical Social Work Note (Signed)
Clinical Social Work Assessment  Patient Details  Name: Lisa Hawkins MRN: [id number] Date of Birth: Oct 25, 1951  Date of referral:  07/16/16               Reason for consult:  Facility Placement                Permission sought to share information with:    Permission granted to share information::     Name::        Agency::     Relationship::     Contact Information:     Housing/Transportation Living arrangements for the past 2 months:  Single Family Home Source of Information:  Patient Patient Interpreter Needed:  None Criminal Activity/Legal Involvement Pertinent to Current Situation/Hospitalization:  No - Comment as needed Significant Relationships:  Friend Lives with:  Roommate Do you feel safe going back to the place where you live?  Yes Need for family participation in patient care:  Yes (Comment)  Care giving concerns:  Patient lives in [place] with her friend Silverio Lay.    Social Worker assessment / plan: Holiday representative (CSW) received SNF consult. PT is recommending SNF. CSW met with patient alone at bedside to discuss D/C plan. CSW introduced self and explained role of CSW department. Patient reported that she lives in [place] with her friend [name]. Patient reported that she takes care of [name] when he is sick and [name] takes care of her when she is sick. CSW explained that PT is recommending SNF. Patient adamantly refused rehab and reported that she is going home with home health. Per patient [name] can take care of her at home. RN Case Manager is aware of above. CSW will continue to follow and assist as needed.    Employment status:  Retired Nurse, adult PT Recommendations:  [place] / Referral to community resources:  Other (Comment Required) (Patient will D/C home with home health. Patient is refusing SNF. )  Patient/Family's Response to care:  Patient refused SNF and is going home.    Patient/Family's Understanding of and Emotional Response to Diagnosis, Current Treatment, and Prognosis:  Patient was pleasant and thanked CSW for visit.   Emotional Assessment Appearance:  Appears stated age Attitude/Demeanor/Rapport:    Affect (typically observed):  Pleasant Orientation:  Oriented to Self, Oriented to Place, Oriented to  Time, Oriented to Situation Alcohol / Substance use:  Not Applicable Psych involvement (Current and /or in the community):  No (Comment)  Discharge Needs  Concerns to be addressed:  Discharge Planning Concerns Readmission within the last 30 days:  No Current discharge risk:  Dependent with Mobility Barriers to Discharge:  Continued Medical Work up   [employer], Veronia Beets, LCSW 07/16/2016, 5:01 PM

## 2016-07-16 NOTE — Evaluation (Signed)
Occupational Therapy Evaluation Patient Details Name: Lisa Hawkins MRN: [id number] DOB: 1951-04-12 Today's Date: 07/16/2016    History of Present Illness Pt. is a 65 y.o. Female who was admitted for a Right TKR.   Clinical Impression   Pt. Is a 65 y.o. female who was admitted for a right TKR. Pt presents with limited ROM, 9/10 Pain, weakness, and impaired functional mobility which hinder her ability to complete ADL and IADL tasks. Pt. could benefit from skilled OT services for ADL and A/E training, Pt. education about home modifications and DME, and to improve functional mobility for ADLs. Pt. Could benefit from SNF with follow-up OT services.    Follow Up Recommendations  SNF    Equipment Recommendations       Recommendations for Other Services PT consult     Precautions / Restrictions Restrictions Weight Bearing Restrictions: Yes RLE Weight Bearing: Weight bearing as tolerated                                                     ADL Overall ADL's : Needs assistance/impaired Eating/Feeding: Set up   Grooming: Set up               Lower Body Dressing: Maximal assistance               Functional mobility during ADLs:  (Pt. seen from chair. Mobility deferred secondary to 9/10 pain.) General ADL Comments: Pt. education was provided about A/E use for LE dressing. Questionable carry over of learned information this session secondary to intermittent periods of lethargy, and preoccupation with 9/10 pain in knee.     Vision     Perception     Praxis      Pertinent Vitals/Pain Pain Assessment: 0-10 Pain Score: 9  Pain Location: Right knee Pain Descriptors / Indicators: Throbbing Pain Intervention(s): Limited activity within patient's tolerance;Monitored during session;Repositioned;Patient requesting pain meds-RN notified     Hand Dominance     Extremity/Trunk Assessment Upper Extremity Assessment Upper Extremity Assessment:  Overall WFL for tasks assessed           Communication     Cognition Arousal/Alertness: Awake/alert;Lethargic Behavior During Therapy: WFL for tasks assessed/performed Overall Cognitive Status: Within Functional Limits for tasks assessed                     General Comments       Exercises       Shoulder Instructions      Home Living Family/patient expects to be discharged to:: Private residence Living Arrangements: Spouse/significant other (Pt. reports living with a man.)                 Bathroom Shower/Tub: Tub/shower unit;Curtain Shower/tub characteristics: Curtain   Bathroom Accessibility: No              Prior Functioning/Environment               OT Diagnosis: Generalized weakness;Acute pain   OT Problem List: Decreased range of motion;Decreased strength;Pain;Decreased safety awareness;Decreased knowledge of use of DME or AE;Decreased activity tolerance   OT Treatment/Interventions: Self-care/ADL training;Therapeutic exercise;Therapeutic activities;DME and/or AE instruction;Patient/family education    OT Goals(Current goals can be found in the care plan section) Acute Rehab OT Goals Patient Stated Goal: To relieve the pain OT Goal Formulation: With patient  Potential to Achieve Goals: Good  OT Frequency: Min 1X/week   Barriers to D/C:            Co-evaluation              End of Session Equipment Utilized During Treatment: Gait belt  Activity Tolerance: Patient tolerated treatment well Patient left: in chair;with call bell/phone within reach;with chair alarm set   Time: 1045-1100 OT Time Calculation (min): 15 min Charges:  OT General Charges $OT Visit: 1 Procedure OT Evaluation $OT Eval Moderate Complexity: 1 Procedure G-Codes:    Harrel Carina, MS, OTR/L 07/16/2016, 1:44 PM

## 2016-07-16 NOTE — Care Management Important Message (Signed)
Important Message  Patient Details  Name: Lisa Hawkins MRN: [id number] Date of Birth: [birth year]   Medicare Important Message Given:  Yes    Alvie Heidelberg, RN 07/16/2016, 1:01 PM

## 2016-07-17 LAB — BASIC METABOLIC PANEL
Anion gap: 10 (ref 5–15)
BUN: 10 mg/dL (ref 6–20)
CO2: 25 mmol/L (ref 22–32)
CREATININE: 0.71 mg/dL (ref 0.44–1.00)
Calcium: 8.9 mg/dL (ref 8.9–10.3)
Chloride: 96 mmol/L — ABNORMAL LOW (ref 101–111)
GFR calc Af Amer: >60 mL/min (ref >60)
GLUCOSE: 142 mg/dL — AB (ref 65–99)
POTASSIUM: 3.4 mmol/L — AB (ref 3.5–5.1)
Sodium: 131 mmol/L — ABNORMAL LOW (ref 135–145)

## 2016-07-17 LAB — CBC
HCT: 39.6 % (ref 35.0–47.0)
Hemoglobin: 13.4 g/dL (ref 12.0–16.0)
MCH: 33 pg (ref 26.0–34.0)
MCHC: 33.9 g/dL (ref 32.0–36.0)
MCV: 97.4 fL (ref 80.0–100.0)
PLATELETS: 177 10*3/uL (ref 150–440)
RBC: 4.06 MIL/uL (ref 3.80–5.20)
RDW: 14.7 % — ABNORMAL HIGH (ref 11.5–14.5)
WBC: 14.2 10*3/uL — ABNORMAL HIGH (ref 3.6–11.0)

## 2016-07-17 NOTE — Progress Notes (Signed)
Occupational Therapy Treatment Patient Details Name: SHAUNTEE BEZANSON MRN: [id number] DOB: Jul 17, 1951 Today's Date: 07/17/2016    History of present illness Pt. is a 65 y.o. femal who was admitted for a Right TKR.   OT comments  Pt pain and participation this session much better than yesterday - pt not lethargic and showed progress in ADL's and use of AE when needed - mobility sit <> stand from chair min A - and pain with mobility and donning and doffing of KI .  Recommend next session functional mobility to bathroom - and showed decrease safety stand to sit - flop down without warning - safety integrate   Follow Up Recommendations  SNF    Equipment Recommendations       Recommendations for Other Services PT consult    Precautions / Restrictions Precautions Precautions: Knee;Fall Restrictions Weight Bearing Restrictions: Yes RLE Weight Bearing: Weight bearing as tolerated       Mobility Bed Mobility Overal bed mobility: Needs Assistance Bed Mobility: Sit to Supine     Supine to sit: Min assist        Transfers Overall transfer level: Needs assistance Equipment used: Rolling walker (2 wheeled) Transfers: Sit to/from Stand Sit to Stand: Mod assist         General transfer comment: overall better movement and less anxiety today    Balance Overall balance assessment: Needs assistance Sitting-balance support: Feet supported Sitting balance-Leahy Scale: Good     Standing balance support: Bilateral upper extremity supported Standing balance-Leahy Scale: Fair                     ADL                                                Vision                     Perception     Praxis      Cognition   Behavior During Therapy: WFL for tasks assessed/performed Overall Cognitive Status: Within Functional Limits for tasks assessed                       Extremity/Trunk Assessment               Exercises Other  Exercises Other Exercises: Pt sit to stand with min A - took 2 attempts - mod v/c for hand placements and pushing thru L leg - no LOB simulating clothing management - did lean backwards Other Exercises: LB dressing donning and doffing pants without AE, socks donn and doff with sockaid -needed mod A and mod v/c   Shoulder Instructions       General Comments      Pertinent Vitals/ Pain       Pain Assessment: 0-10 Pain Score: 6  Pain Location: R knee Pain Descriptors / Indicators: Aching Pain Intervention(s): Limited activity within patient's tolerance  Home Living                                          Prior Functioning/Environment              Frequency Min 1X/week     Progress Toward Goals  OT Goals(current  goals can now be found in the care plan section)  Progress towards OT goals: Progressing toward goals  Acute Rehab OT Goals Patient Stated Goal: wants to go home OT Goal Formulation: With patient Potential to Achieve Goals: Good  Plan      Co-evaluation                 End of Session Equipment Utilized During Treatment: Gait belt   Activity Tolerance Patient tolerated treatment well   Patient Left in chair;with call bell/phone within reach;with chair alarm set   Nurse Communication          Time: 1030-1103 OT Time Calculation (min): 33 min  Charges: OT General Charges $OT Visit: 1 Procedure OT Treatments $Self Care/Home Management : 23-37 mins  Alecsander Hattabaugh OTR/L,CLT 07/17/2016, 1:16 PM

## 2016-07-17 NOTE — Progress Notes (Addendum)
Physical Therapy Treatment Patient Details Name: Lisa Hawkins MRN: [id number] DOB: [birth year] Today's Date: 07/17/2016    History of Present Illness Pt. is a 65 y.o. femal who was admitted for a Right TKR.    PT Comments    Pt in chair upon arrival in deep sleep.  Moderate verbal and tactile stimulation to awaken.  Attempted to stand x 3 with +1 assist but pt was unable to transition hands from chair to walker.  Obtained 2nd assist for safety and she stood with mod a x 2 and was able to transfer to bed.  While sitting pt started talking about bugs on the floor.  "Don't you see them?" as she is pointing and leaning unsafely to reach them.  She went on to say during the session that they were crawling on her this morning.  She made other unusual statements about a boot that I was to be watching and asking me where it was.  Pt at one point said "I think I am hallucinating".  Pt only allowed minimal AAROM and stretching in a seated position before telling me to stop "I can't be responsible for what happens to you if you do that again".  Assisted pt back to supine and positioned to comfort.  Encouraged pt to participate in supine AAROM but she declined and refused to participate further.    Discussed at length discharge plan.  She continues to state she wants to return home and not go to rehab.  I discussed risk and benefits of home vs rehab especially with her varied mobility level which remains limited at best, ROM limitations and resistance to exercises. She stated "I'll be ok".  She did not want me to write a truthful note on her progress this afternoon.  "If Dr. Rudene Christians finds out I did not do it, he won't let me go home."    Discussed treatment with primary nurse.      Follow Up Recommendations  SNF     Equipment Recommendations       Recommendations for Other Services       Precautions / Restrictions Precautions Precautions: Knee;Fall Restrictions Weight Bearing Restrictions:  Yes RLE Weight Bearing: Weight bearing as tolerated    Mobility  Bed Mobility Overal bed mobility: Needs Assistance Bed Mobility: Sit to Supine     Supine to sit: Min assist     General bed mobility comments: difficulty repositioning herself without handrails or trapeeze  Transfers Overall transfer level: Needs assistance Equipment used: Rolling walker (2 wheeled) Transfers: Sit to/from Stand Sit to Stand: Mod assist;+2 physical assistance         General transfer comment: Pt unable to stand this pm with 3 tries.  obtained 2nd assist and was able to stand fully from chair  Ambulation/Gait Ambulation/Gait assistance: Min assist;+2 physical assistance Ambulation Distance (Feet): 3 Feet Assistive device: Rolling walker (2 wheeled) Gait Pattern/deviations: Step-to pattern   Gait velocity interpretation: <1.8 ft/sec, indicative of risk for recurrent falls General Gait Details: increased difficulty this afternoon and needing increased assist   Stairs Stairs:  (not safe to attempt this pm)          Wheelchair Mobility    Modified Rankin (Stroke Patients Only)       Balance Overall balance assessment: Needs assistance Sitting-balance support: Feet supported Sitting balance-Leahy Scale: Good     Standing balance support: Bilateral upper extremity supported Standing balance-Leahy Scale: Fair  Cognition Arousal/Alertness: Awake/alert Behavior During Therapy: WFL for tasks assessed/performed Overall Cognitive Status: Within Functional Limits for tasks assessed                      Exercises Total Joint Exercises Goniometric ROM: 12-60 Other Exercises Other Exercises: pt participated in minimal exercises this pm.  allowed some knee stretching and flexion/extension.  stopeed session with attempts at supine exercises Other Exercises: LB dressing donning and doffing pants without AE, socks donn and doff with sockaid -needed mod  A and mod v/c    General Comments        Pertinent Vitals/Pain Pain Assessment: 0-10 Pain Score: 7  Pain Location: R knee Pain Descriptors / Indicators: Aching Pain Intervention(s): Limited activity within patient's tolerance    Home Living                      Prior Function            PT Goals (current goals can now be found in the care plan section) Acute Rehab PT Goals Patient Stated Goal: wants to go home    Frequency  BID    PT Plan Current plan remains appropriate    Co-evaluation             End of Session Equipment Utilized During Treatment: Gait belt Activity Tolerance: Treatment limited secondary to agitation;Other (comment) (hallucinations this pm) Patient left: in bed;with call bell/phone within reach;with bed alarm set     Time: 1355-1430 PT Time Calculation (min) (ACUTE ONLY): 35 min  Charges:  $Gait Training: 8-22 mins $Therapeutic Exercise: 8-22 mins $Therapeutic Activity: 8-22 mins                    G Codes:      Chesley Noon, PTA 07/17/16, 3:19 PM

## 2016-07-17 NOTE — Progress Notes (Signed)
Pt wanted prune juice to help with bowel movement given

## 2016-07-17 NOTE — Progress Notes (Signed)
  Subjective: 2 Days Post-Op Procedure(s) (LRB): TOTAL KNEE ARTHROPLASTY (Right) Patient reports pain as moderate.   Patient is well, and has had no acute complaints or problems Denies any CP, SOB, ABD pain. We will continue therapy today.    Objective: Vital signs in last 24 hours: Temp:  [98.3 F (36.8 C)-99.1 F (37.3 C)] 98.3 F (36.8 C) (08/19 0300) Pulse Rate:  [108-110] 108 (08/19 0300) BP: (160-170)/(68-90) 170/90 (08/19 0300) SpO2:  [94 %-95 %] 94 % (08/19 0300)  Intake/Output from previous day: No intake/output data recorded. Intake/Output this shift: No intake/output data recorded.   Recent Labs  07/15/16 1151 07/16/16 0528 07/17/16 0514  HGB 14.6 13.8 13.4    Recent Labs  07/16/16 0528 07/17/16 0514  WBC 10.2 14.2*  RBC 4.13 4.06  HCT 40.5 39.6  PLT 211 177    Recent Labs  07/16/16 0528 07/17/16 0514  NA 134* 131*  K 3.9 3.4*  CL 102 96*  CO2 24 25  BUN 12 10  CREATININE 0.80 0.71  GLUCOSE 138* 142*  CALCIUM 8.5* 8.9   No results for input(s): LABPT, INR in the last 72 hours.  EXAM General - Patient is Alert, Appropriate and Oriented Extremity - Neurovascular intact Sensation intact distally Intact pulses distally Dorsiflexion/Plantar flexion intact No cellulitis present Compartment soft Dressing - dressing C/D/I and wound vac intact, no drainage Motor Function - intact, moving foot and toes well on exam.   Past Medical History:  Diagnosis Date  . Anxiety   . GERD (gastroesophageal reflux disease)   . Hepatitis    Hep C - treated with Harvoni  . HIV (human immunodeficiency virus infection) ([place])   . Hypertension   . Neuromuscular disorder ([facility])    toes  . Neuropathy ([place])   . PONV (postoperative nausea and vomiting)     Assessment/Plan:   2 Days Post-Op Procedure(s) (LRB): TOTAL KNEE ARTHROPLASTY (Right) Active Problems:   Primary localized osteoarthritis of right knee   Hypokalemia   Hyponatremia   Estimated body  mass index is 37.02 kg/m as calculated from the following:   Height as of this encounter: 5\' 3"  (1.6 m).   Weight as of this encounter: 94.8 kg (209 lb). Advance diet Up with therapy  Needs BM DC IV fluids, Klor kon 40 meq BID, Recheck labs in the am Continue to monitor pain, Oxycontin 15mg  q 12 initated  DVT Prophylaxis - Lovenox, Foot Pumps and TED hose Weight-Bearing as tolerated to right leg   T. Rachelle Hora, PA-C [place] 07/17/2016, 9:09 AM

## 2016-07-17 NOTE — Progress Notes (Signed)
Physical Therapy Treatment Patient Details Name: Lisa Hawkins MRN: [id number] DOB: 1951-06-30 Today's Date: 07/17/2016    History of Present Illness Pt. is a 65 y.o. femal who was admitted for a Right TKR.    PT Comments    Pt in bed requesting to use bathroom.  To edge of bed with min  a x 1.  Stood with mod a x 1 and was able to transfer to commode at bedside.  She tn transferred commode to recliner with good steps and overall improved safety from yesterdays session.  After a short rest she was able to ambulate 15' x 2 with walker and min assist x 1.  Pt with knee immobilizer donned and no buckling or lob's noted.  Discussed at length safety with mobility and transfers.  She was fatigued with session but overall did significantly better this morning.  Will focus on exercise and ROM this afternoon.    Follow Up Recommendations  SNF     Equipment Recommendations       Recommendations for Other Services       Precautions / Restrictions Precautions Precautions: Knee;Fall Restrictions Weight Bearing Restrictions: Yes RLE Weight Bearing: Weight bearing as tolerated    Mobility  Bed Mobility Overal bed mobility: Needs Assistance Bed Mobility: Sit to Supine     Supine to sit: Min assist        Transfers Overall transfer level: Needs assistance Equipment used: Rolling walker (2 wheeled) Transfers: Sit to/from Stand Sit to Stand: Mod assist         General transfer comment: overall better movement and less anxiety today  Ambulation/Gait Ambulation/Gait assistance: Min assist Ambulation Distance (Feet): 15 Feet (x 2) Assistive device: Rolling walker (2 wheeled) Gait Pattern/deviations: Step-to pattern   Gait velocity interpretation: <1.8 ft/sec, indicative of risk for recurrent falls General Gait Details: OVerall improved safety and less assist with gait.  Overall steady but self limits due to pain.   Stairs            Wheelchair Mobility    Modified  Rankin (Stroke Patients Only)       Balance Overall balance assessment: Needs assistance Sitting-balance support: Feet supported Sitting balance-Leahy Scale: Good     Standing balance support: Bilateral upper extremity supported Standing balance-Leahy Scale: Fair                      Cognition Arousal/Alertness: Awake/alert Behavior During Therapy: WFL for tasks assessed/performed Overall Cognitive Status: Within Functional Limits for tasks assessed                      Exercises Other Exercises Other Exercises: toilet transfers    General Comments        Pertinent Vitals/Pain Pain Assessment: 0-10 Pain Score: 8  Pain Location: R knee Pain Descriptors / Indicators: Aching;Operative site guarding Pain Intervention(s): Limited activity within patient's tolerance    Home Living                      Prior Function            PT Goals (current goals can now be found in the care plan section) Progress towards PT goals: Progressing toward goals    Frequency  BID    PT Plan Current plan remains appropriate    Co-evaluation             End of Session Equipment Utilized During Treatment: Gait belt  Activity Tolerance: Patient tolerated treatment well Patient left: with nursing/sitter in room     Time: 1005-1029 PT Time Calculation (min) (ACUTE ONLY): 24 min  Charges:  $Gait Training: 8-22 mins $Therapeutic Exercise: 8-22 mins                    G Codes:      Chesley Noon 07/19/2016, 12:28 PM

## 2016-07-18 LAB — CBC
HCT: 35.4 % (ref 35.0–47.0)
Hemoglobin: 12.1 g/dL (ref 12.0–16.0)
MCH: 33.4 pg (ref 26.0–34.0)
MCHC: 34.2 g/dL (ref 32.0–36.0)
MCV: 97.7 fL (ref 80.0–100.0)
PLATELETS: 190 10*3/uL (ref 150–440)
RBC: 3.63 MIL/uL — ABNORMAL LOW (ref 3.80–5.20)
RDW: 14.5 % (ref 11.5–14.5)
WBC: 15.9 10*3/uL — AB (ref 3.6–11.0)

## 2016-07-18 LAB — BASIC METABOLIC PANEL
ANION GAP: 7 (ref 5–15)
BUN: 11 mg/dL (ref 6–20)
CO2: 29 mmol/L (ref 22–32)
Calcium: 9 mg/dL (ref 8.9–10.3)
Chloride: 98 mmol/L — ABNORMAL LOW (ref 101–111)
Creatinine, Ser: 0.78 mg/dL (ref 0.44–1.00)
GFR calc Af Amer: >60 mL/min (ref >60)
GFR calc non Af Amer: >60 mL/min (ref >60)
GLUCOSE: 143 mg/dL — AB (ref 65–99)
Potassium: 3.6 mmol/L (ref 3.5–5.1)
SODIUM: 134 mmol/L — AB (ref 135–145)

## 2016-07-18 MED ORDER — OXYCODONE HCL 5 MG PO TABS: 5.0000 mg | ORAL_TABLET | ORAL | 0 refills | Status: DC | PRN

## 2016-07-18 MED ORDER — OXYCODONE HCL ER 15 MG PO T12A: 15.0000 mg | EXTENDED_RELEASE_TABLET | Freq: Two times a day (BID) | ORAL | 0 refills | Status: DC

## 2016-07-18 MED ORDER — TAPENTADOL HCL 100 MG PO TABS: 100.0000 mg | ORAL_TABLET | ORAL | 0 refills | Status: DC | PRN

## 2016-07-18 MED ORDER — SODIUM CHLORIDE 0.9 % IV BOLUS (SEPSIS)
1000.0000 mL | Freq: Once | INTRAVENOUS | Status: AC
  Administered 2016-07-18: 1000 mL via INTRAVENOUS

## 2016-07-18 MED ORDER — ENOXAPARIN SODIUM 40 MG/0.4ML ~~LOC~~ SOLN: 40.0000 mg | SUBCUTANEOUS | 0 refills | Status: DC

## 2016-07-18 MED ORDER — TAPENTADOL HCL 50 MG PO TABS
100.0000 mg | ORAL_TABLET | ORAL | Status: DC | PRN
  Administered 2016-07-18 – 2016-07-19 (×4): 100 mg via ORAL
  Filled 2016-07-18 (×4): qty 2

## 2016-07-18 NOTE — Progress Notes (Signed)
Physical Therapy Treatment Patient Details Name: Lisa Hawkins MRN: [id number] DOB: 19-Oct-1951 Today's Date: 07/18/2016    History of Present Illness Pt. is a 65 y.o. femal who was admitted for a Right TKR.    PT Comments    Pt again has a difficult session.  She is very pain limited and needs a lot of cuing to do even basic exercises.  Pt does tolerate (with excessive cuing and encouragement) some increased ROM pressure with exercises and gets to near 90 degrees.  She is able to ambulate minimally and has an "episode." She stated she was feeling like she was going to black out, needed to sit quickly, kept her eyes closed for a few seconds and then stated that that happens to her multiple times a week when she gets too hot...  Pt distraught about having to go to rehab and was still refusing despite acknowledging that she is not safe at home.  Pt shows poor activity tolerance, is very pain limited and has no been able to walk more than a few feet showing reasonable safety awareness.  Follow Up Recommendations  SNF (pt is, unequivocally, unsafe to go home)     Equipment Recommendations       Recommendations for Other Services       Precautions / Restrictions Precautions Precautions: Knee;Fall Restrictions Weight Bearing Restrictions: Yes RLE Weight Bearing: Weight bearing as tolerated    Mobility  Bed Mobility Overal bed mobility: Needs Assistance Bed Mobility: Sit to Supine     Supine to sit: Min assist     General bed mobility comments: Pt continues to be pain limited and overall shows poor ability to control/move R LE w/o excessive UE assist  Transfers Overall transfer level: Needs assistance Equipment used: Rolling walker (2 wheeled) Transfers: Sit to/from Stand Sit to Stand: Min assist;Mod assist         General transfer comment: Pt able to get to standing with a lot of cuing for positioning/set up and general encouragement.  Pt continues to be weak and limited  with these attempts.   Ambulation/Gait Ambulation/Gait assistance: Max assist Ambulation Distance (Feet): 12 Feet Assistive device: Rolling walker (2 wheeled)       General Gait Details: Pt able to ambulate a few feet but c/o pain the entire time and then had a "passing out" episode that she reports is the seizures she has multiple times a week when she gets too hot... Pt was not safe with ambulation, needed max assist to insure she was able to land safely on a chair.     Stairs            Wheelchair Mobility    Modified Rankin (Stroke Patients Only)       Balance Overall balance assessment: Needs assistance           Standing balance-Leahy Scale: Poor Standing balance comment: Pt had episode where she was unable to control herself and needed max assist to remain upright until chair was behind her.                    Cognition Arousal/Alertness: Awake/alert Behavior During Therapy: Impulsive;Anxious Overall Cognitive Status: Within Functional Limits for tasks assessed                      Exercises Total Joint Exercises Ankle Circles/Pumps: AROM;10 reps Quad Sets: Strengthening;15 reps Gluteal Sets: Strengthening;15 reps Short Arc Quad: AROM;10 reps Heel Slides: AAROM;5 reps Hip  ABduction/ADduction: AROM;10 reps Knee Flexion: PROM;5 reps Goniometric ROM: 5-87, c/o severe pain with light overpressure    General Comments        Pertinent Vitals/Pain Pain Score: 9     Home Living                      Prior Function            PT Goals (current goals can now be found in the care plan section) Progress towards PT goals: Progressing toward goals    Frequency  BID    PT Plan Current plan remains appropriate    Co-evaluation             End of Session Equipment Utilized During Treatment: Gait belt Activity Tolerance: Patient limited by pain Patient left: with chair alarm set;with call bell/phone within reach      Time: 0821-0905 PT Time Calculation (min) (ACUTE ONLY): 44 min  Charges:  $Gait Training: 8-22 mins $Therapeutic Exercise: 8-22 mins $Therapeutic Activity: 8-22 mins                    G Codes:      Kreg Shropshire, DPT 07/18/2016, 12:09 PM

## 2016-07-18 NOTE — Progress Notes (Signed)
Patient c/o pain refusing to take Oxycodone, because "it doesn't work and the morphine makes my itchy." Patient has already had Robaxin and it is not time of this med. Notified MD.

## 2016-07-18 NOTE — Care Management Note (Signed)
Case Management Note  Patient Details  Name: Lisa Hawkins MRN: [id number] Date of Birth: 1951-11-10  Subjective/Objective:   This Probation officer requested that [facility]-SW please discuss rehab with [name] again today per [place] in PT is reporting that [name] is not safe to return home at this time.                  Action/Plan:   Expected Discharge Date:  07/17/16               Expected Discharge Plan:  [place]  In-House Referral:  Clinical Social Work  Discharge planning Services  CM Consult  Post Acute Care Choice:  Durable Medical Equipment, Home Health Choice offered to:  Patient  DME Arranged:  3-N-1, [facility] Rile DME Agency:     HH Arranged:  PT, Social Work [employer] Agency:     Status of Service:  In process, will continue to follow  If discussed at Long Length of Stay Meetings, dates discussed:    Additional Comments:  Mayara Paulson A, RN 07/18/2016, 9:14 AM

## 2016-07-18 NOTE — Discharge Instructions (Signed)

## 2016-07-18 NOTE — Progress Notes (Addendum)
  Subjective: 3 Days Post-Op Procedure(s) (LRB): TOTAL KNEE ARTHROPLASTY (Right) Patient reports pain as moderate.   Patient is well, and has had no acute complaints or problems.  Denies any CP, SOB, ABD pain. We will continue therapy today.    Objective: Vital signs in last 24 hours: Temp:  [98.4 F (36.9 C)-98.7 F (37.1 C)] 98.4 F (36.9 C) (08/20 0458) Pulse Rate:  [100-115] 114 (08/20 0458) Resp:  [18] 18 (08/20 0458) BP: (128-135)/(67-77) 135/67 (08/20 0458) SpO2:  [95 %-100 %] 95 % (08/20 0458)  Intake/Output from previous day: No intake/output data recorded. Intake/Output this shift: No intake/output data recorded.   Recent Labs  07/15/16 1151 07/16/16 0528 07/17/16 0514 07/18/16 0424  HGB 14.6 13.8 13.4 12.1    Recent Labs  07/17/16 0514 07/18/16 0424  WBC 14.2* 15.9*  RBC 4.06 3.63*  HCT 39.6 35.4  PLT 177 190    Recent Labs  07/17/16 0514 07/18/16 0424  NA 131* 134*  K 3.4* 3.6  CL 96* 98*  CO2 25 29  BUN 10 11  CREATININE 0.71 0.78  GLUCOSE 142* 143*  CALCIUM 8.9 9.0   No results for input(s): LABPT, INR in the last 72 hours.  EXAM General - Patient is Alert, Appropriate and Oriented Extremity - Neurovascular intact Sensation intact distally Intact pulses distally Dorsiflexion/Plantar flexion intact No cellulitis present Compartment soft  - homans sign BLE Dressing - dressing C/D/I and wound vac intact, no drainage Motor Function - intact, moving foot and toes well on exam.   Past Medical History:  Diagnosis Date  . Anxiety   . GERD (gastroesophageal reflux disease)   . Hepatitis    Hep C - treated with Harvoni  . HIV (human immunodeficiency virus infection) ([place])   . Hypertension   . Neuromuscular disorder (HCC)    toes  . Neuropathy ([place])   . PONV (postoperative nausea and vomiting)     Assessment/Plan:   3 Days Post-Op Procedure(s) (LRB): TOTAL KNEE ARTHROPLASTY (Right) Active Problems:   Primary localized  osteoarthritis of right knee   Hyponatremia   Estimated body mass index is 37.02 kg/m as calculated from the following:   Height as of this encounter: 5\' 3"  (1.6 m).   Weight as of this encounter: 94.8 kg (209 lb). Advance diet Up with therapy  Needs BM Tachycardia- will give 1 liter of NS and continue to monitor HR.  Patient will plan on going to rehabilitation facility tomorrow a 21 2017. They will need to remove the wound VAC and apply honeycomb dressing on 07/22/2016. She'll follow-up with [facility] orthopedics and 2 weeks for staple removal.    DVT Prophylaxis - Lovenox, Foot Pumps and TED hose Weight-Bearing as tolerated to right leg   T. Rachelle Hora, PA-C [place] 07/18/2016, 8:26 AM

## 2016-07-18 NOTE — Care Management Note (Signed)
Case Management Note  Patient Details  Name: Lisa Hawkins MRN: [id number] Date of Birth: [birth year]  Subjective/Objective:     [facility]-PT is reporting that [name] is unsafe to go home and [name] is agreeing to discuss SNF. Requested that [facility]SW see [name] again to discuss Rehab.                Action/Plan:   Expected Discharge Date:  07/17/16               Expected Discharge Plan:  [place]  In-House Referral:  Clinical Social Work  Discharge planning Services  CM Consult  Post Acute Care Choice:  Durable Medical Equipment, Home Health Choice offered to:  Patient  DME Arranged:  3-N-1, [facility] DME Agency:     HH Arranged:  PT, Social Work [employer] Agency:     Status of Service:  In process, will continue to follow  If discussed at Long Length of Stay Meetings, dates discussed:    Additional Comments:  Lucyle Alumbaugh A, RN 07/18/2016, 3:40 PM

## 2016-07-18 NOTE — Discharge Summary (Signed)
Physician Discharge Summary  Patient ID: Lisa Hawkins MRN: [id number] DOB/AGE: [birth year] 66 y.o.  Admit date: 07/15/2016 Discharge date: 07/19/2016 Admission Diagnoses:  DEGENERATIVE OSTEOARTHRITIS   Discharge Diagnoses: Patient Active Problem List   Diagnosis Date Noted  . Primary localized osteoarthritis of right knee 07/15/2016    Past Medical History:  Diagnosis Date  . Anxiety   . GERD (gastroesophageal reflux disease)   . Hepatitis    Hep C - treated with Harvoni  . HIV (human immunodeficiency virus infection) ([place])   . Hypertension   . Neuromuscular disorder (HCC)    toes  . Neuropathy ([place])   . PONV (postoperative nausea and vomiting)      Transfusion: none   Consultants (if any):   Discharged Condition: Improved  Hospital Course: Lisa Hawkins is an 65 y.o. female who was admitted 07/15/2016 with a diagnosis of Right knee osteoarthritis and went to the operating room on 07/15/2016 and underwent the above named procedures.    Surgeries: Procedure(s): TOTAL KNEE ARTHROPLASTY on 07/15/2016 Patient tolerated the surgery well. Taken to PACU where she was stabilized and then transferred to the orthopedic floor.  Started on Lovenox 30 q 12 hrs. Foot pumps applied bilaterally at 80 mm. Heels elevated on bed with rolled towels. No evidence of DVT. Negative Homan. Physical therapy started on day #1 for gait training and transfer. OT started day #1 for ADL and assisted devices.  Patient's foley was d/c on day #1. Patient's IV was d/c on day #2.   On post op day #4 patient was stable and ready for discharge to SNF.  Implants:  Medacta GMK sphere system, 2 right femur, 2 tibia with short stem, 12 mm insert, 2 patella all components cemented  She was given perioperative antibiotics:  Anti-infectives    Start     Dose/Rate Route Frequency Ordered Stop   07/15/16 2200  abacavir-dolutegravir-lamiVUDine (TRIUMEQ) 600-50-300 MG per tablet 1 tablet     1 tablet Oral  Daily at bedtime 07/15/16 1709     07/15/16 2000  clindamycin (CLEOCIN) IVPB 900 mg     900 mg 100 mL/hr over 30 Minutes Intravenous Every 6 hours 07/15/16 1709 07/16/16 0620   07/15/16 1018  clindamycin (CLEOCIN) 900 MG/50ML IVPB    Comments:  Idamae Lusher: cabinet override      07/15/16 1018 07/15/16 2229   07/15/16 0315  vancomycin (VANCOCIN) IVPB 1000 mg/200 mL premix     1,000 mg 200 mL/hr over 60 Minutes Intravenous  Once 07/15/16 0313 07/15/16 1353   07/15/16 0315  clindamycin (CLEOCIN) IVPB 900 mg     900 mg 100 mL/hr over 30 Minutes Intravenous  Once 07/15/16 0313 07/15/16 1411    .  She was given sequential compression devices, early ambulation, and lovenox for DVT prophylaxis.  She benefited maximally from the hospital stay and there were no complications.    Recent vital signs:  Vitals:   07/18/16 0930 07/18/16 1420  BP: 140/70 (!) 150/69  Pulse: (!) 105 (!) 117  Resp:  18  Temp: 98 F (36.7 C) 98.8 F (37.1 C)    Recent laboratory studies:  Lab Results  Component Value Date   HGB 12.1 07/18/2016   HGB 13.4 07/17/2016   HGB 13.8 07/16/2016   Lab Results  Component Value Date   WBC 15.9 (H) 07/18/2016   PLT 190 07/18/2016   Lab Results  Component Value Date   INR 1.09 06/23/2016   Lab Results  Component Value  Date   NA 134 (L) 07/18/2016   K 3.6 07/18/2016   CL 98 (L) 07/18/2016   CO2 29 07/18/2016   BUN 11 07/18/2016   CREATININE 0.78 07/18/2016   GLUCOSE 143 (H) 07/18/2016    Discharge Medications:     Medication List    TAKE these medications   amitriptyline 25 MG tablet Commonly known as:  ELAVIL Take 25 mg by mouth 3 (three) times daily.   amLODipine 10 MG tablet Commonly known as:  NORVASC Take 10 mg by mouth at bedtime.   aspirin EC 81 MG tablet Take 81 mg by mouth at bedtime.   citalopram 10 MG tablet Commonly known as:  CELEXA Take 10 mg by mouth at bedtime.   enoxaparin 40 MG/0.4ML injection Commonly known as:   LOVENOX Inject 0.4 mLs (40 mg total) into the skin daily.   gabapentin 300 MG capsule Commonly known as:  NEURONTIN Take 900 mg by mouth 2 (two) times daily.   hydrochlorothiazide 25 MG tablet Commonly known as:  HYDRODIURIL Take 25 mg by mouth at bedtime.   mirtazapine 30 MG tablet Commonly known as:  REMERON Take 30 mg by mouth at bedtime.   oxyCODONE 15 mg 12 hr tablet Commonly known as:  OXYCONTIN Take 1 tablet (15 mg total) by mouth every 12 (twelve) hours.   Tapentadol HCl 100 MG Tabs Take 1 tablet (100 mg total) by mouth every 4 (four) hours as needed for moderate pain.   TRIUMEQ 600-50-300 MG tablet Generic drug:  abacavir-dolutegravir-lamiVUDine Take 1 tablet by mouth at bedtime.       Diagnostic Studies: Dg Knee 1-2 Views Right  Result Date: 07/15/2016 CLINICAL DATA:  Postop day 0 after right total knee arthroplasty. EXAM: RIGHT KNEE - 1-2 VIEW COMPARISON:  None. FINDINGS: Status post surgical changes of total knee arthroplasty. Hardware appears intact and appropriately positioned. Expected postsurgical changes within the overlying soft tissues. IMPRESSION: Postsurgical changes of total knee arthroplasty. No evidence of surgical complicating feature. Electronically Signed   By: Franki Cabot M.D.   On: 07/15/2016 16:44    Disposition:     Follow-up Information    MENZ,MICHAEL, MD. Call in 2 week(s).   Specialty:  Orthopedic Surgery Why:  for staple removal Contact information: [place] [id number] [phone]            Signed: Feliberto Gottron 07/18/2016, 7:51 PM

## 2016-07-19 DIAGNOSIS — R488 Other symbolic dysfunctions: Secondary | ICD-10-CM | POA: Diagnosis not present

## 2016-07-19 DIAGNOSIS — G609 Hereditary and idiopathic neuropathy, unspecified: Secondary | ICD-10-CM | POA: Diagnosis not present

## 2016-07-19 DIAGNOSIS — Z96651 Presence of right artificial knee joint: Secondary | ICD-10-CM | POA: Diagnosis not present

## 2016-07-19 DIAGNOSIS — M25569 Pain in unspecified knee: Secondary | ICD-10-CM | POA: Diagnosis not present

## 2016-07-19 DIAGNOSIS — M1711 Unilateral primary osteoarthritis, right knee: Secondary | ICD-10-CM | POA: Diagnosis not present

## 2016-07-19 DIAGNOSIS — G629 Polyneuropathy, unspecified: Secondary | ICD-10-CM | POA: Diagnosis not present

## 2016-07-19 DIAGNOSIS — I10 Essential (primary) hypertension: Secondary | ICD-10-CM | POA: Diagnosis not present

## 2016-07-19 DIAGNOSIS — M1712 Unilateral primary osteoarthritis, left knee: Secondary | ICD-10-CM | POA: Diagnosis not present

## 2016-07-19 DIAGNOSIS — Z21 Asymptomatic human immunodeficiency virus [HIV] infection status: Secondary | ICD-10-CM | POA: Diagnosis not present

## 2016-07-19 DIAGNOSIS — Z880 Allergy status to penicillin: Secondary | ICD-10-CM | POA: Diagnosis not present

## 2016-07-19 DIAGNOSIS — S82009A Unspecified fracture of unspecified patella, initial encounter for closed fracture: Secondary | ICD-10-CM | POA: Diagnosis not present

## 2016-07-19 DIAGNOSIS — K219 Gastro-esophageal reflux disease without esophagitis: Secondary | ICD-10-CM | POA: Diagnosis not present

## 2016-07-19 DIAGNOSIS — B20 Human immunodeficiency virus [HIV] disease: Secondary | ICD-10-CM | POA: Diagnosis not present

## 2016-07-19 DIAGNOSIS — R262 Difficulty in walking, not elsewhere classified: Secondary | ICD-10-CM | POA: Diagnosis not present

## 2016-07-19 DIAGNOSIS — M6281 Muscle weakness (generalized): Secondary | ICD-10-CM | POA: Diagnosis not present

## 2016-07-19 DIAGNOSIS — R000 Tachycardia, unspecified: Secondary | ICD-10-CM | POA: Diagnosis not present

## 2016-07-19 DIAGNOSIS — R41841 Cognitive communication deficit: Secondary | ICD-10-CM | POA: Diagnosis not present

## 2016-07-19 DIAGNOSIS — F419 Anxiety disorder, unspecified: Secondary | ICD-10-CM | POA: Diagnosis not present

## 2016-07-19 DIAGNOSIS — Z5189 Encounter for other specified aftercare: Secondary | ICD-10-CM | POA: Diagnosis not present

## 2016-07-19 DIAGNOSIS — G9009 Other idiopathic peripheral autonomic neuropathy: Secondary | ICD-10-CM | POA: Diagnosis not present

## 2016-07-19 DIAGNOSIS — E876 Hypokalemia: Secondary | ICD-10-CM | POA: Diagnosis not present

## 2016-07-19 DIAGNOSIS — E871 Hypo-osmolality and hyponatremia: Secondary | ICD-10-CM | POA: Diagnosis not present

## 2016-07-19 MED ORDER — FLEET ENEMA 7-19 GM/118ML RE ENEM: 1.0000 | ENEMA | Freq: Every day | RECTAL | Status: DC | PRN

## 2016-07-19 MED ORDER — BISACODYL 10 MG RE SUPP
10.0000 mg | Freq: Once | RECTAL | Status: AC
  Administered 2016-07-19: 10 mg via RECTAL
  Filled 2016-07-19: qty 1

## 2016-07-19 NOTE — Progress Notes (Signed)
Physical Therapy Treatment Patient Details Name: Lisa Hawkins MRN: [id number] DOB: 11-26-1951 Today's Date: 07/19/2016    History of Present Illness Pt. is a 65 y.o. femal who was admitted for a Right TKR.    PT Comments    Pt requires encouragement to participate with PT today. Once agreeable, pt able to demonstrate improved bed mobility with increased time and effort. Pt agitated from the beginning noting complaining that therapist tied her gown to roughly. Pt complains throughout session that "you people don't understand when someone is in pain" Pt is able to arise from bed for ambulation with Min A and good effort. Improved ambulation distance today with encouragement, but poor/unsafe chair approach despite cues. Initiated several exercises; however, once pt right lower extremity passively lifted slightly to place on ankle roll for quad sets, pt again complains of therapist being too rough and not understanding her pain refusing further exercises. Pt wishes pain medication and instructed to contact nursing. Pt is comfortable in recliner at session's end. Pt has current discharge order to skilled nursing facility today to continue progression of strength, range, safety and all functional mobility.   Follow Up Recommendations  SNF     Equipment Recommendations       Recommendations for Other Services       Precautions / Restrictions Precautions Precautions: Knee;Fall Required Braces or Orthoses: Knee Immobilizer - Right Knee Immobilizer - Right: On when out of bed or walking Restrictions Weight Bearing Restrictions: Yes RLE Weight Bearing: Weight bearing as tolerated    Mobility  Bed Mobility Overal bed mobility: Modified Independent Bed Mobility: Supine to Sit     Supine to sit: Modified independent (Device/Increase time)     General bed mobility comments: Able to perform without assist with assertive encouragement, as pt does not wish out of bed  Transfers Overall  transfer level: Needs assistance Equipment used: Rolling walker (2 wheeled) Transfers: Sit to/from Stand Sit to Stand: Min assist         General transfer comment: Cues for LLE use and encouragement to perform. Increased time to rise. Poor safety with chair approach/sit, sitting prematurely uncontrolled  Ambulation/Gait Ambulation/Gait assistance: Min guard Ambulation Distance (Feet): 36 Feet Assistive device: Rolling walker (2 wheeled) Gait Pattern/deviations: Step-to pattern;Antalgic;Trunk flexed;Wide base of support Gait velocity: decreased Gait velocity interpretation: <1.8 ft/sec, indicative of risk for recurrent falls General Gait Details: Ambulates with effort and several pauses. Pt requests to sit within 10 ft of recliner due to pain in hands, but encouraged to continue to chair. Pt with poor chair approach despite cues to back up 1-2 more steps for safe sit. Pt reaches with uncontrolled sit and agitation toward therapist for not understanding her hands hurt. Pt notes she would rather fall on floor than have hands hurt. Stressed safety with pt despite her obstinance.     Stairs            Wheelchair Mobility    Modified Rankin (Stroke Patients Only)       Balance Overall balance assessment: Needs assistance Sitting-balance support: Feet supported;Bilateral upper extremity supported Sitting balance-Leahy Scale: Good     Standing balance support: Bilateral upper extremity supported Standing balance-Leahy Scale: Fair Standing balance comment: Required only Min guard today                    Cognition Arousal/Alertness: Awake/alert Behavior During Therapy: Agitated;WFL for tasks assessed/performed Overall Cognitive Status: Within Functional Limits for tasks assessed  Exercises Total Joint Exercises Ankle Circles/Pumps: AROM;Both;15 reps (long sit) Quad Sets: Strengthening;Both;15 reps (long sit) Gluteal Sets:  Strengthening;Both;15 reps (long sit) Other Exercises Other Exercises: Pt refuses other exercises at this time; agitated she is encouraged to be in chair. Also agitated that therapist does not understand her pain despite careful management of RLE    General Comments        Pertinent Vitals/Pain Pain Assessment:  ("a lot") Pain Location: R knee Pain Descriptors / Indicators: Constant (Pt c/o of increased pain with all active/passive mvt of RLE,) Pain Intervention(s): Limited activity within patient's tolerance;Monitored during session;Premedicated before session;Repositioned;Ice applied    Home Living                      Prior Function            PT Goals (current goals can now be found in the care plan section) Progress towards PT goals: Progressing toward goals    Frequency  BID    PT Plan Current plan remains appropriate    Co-evaluation             End of Session Equipment Utilized During Treatment: Gait belt Activity Tolerance: Patient limited by fatigue;Patient limited by pain;Treatment limited secondary to agitation Patient left: in chair;with call bell/phone within reach;with chair alarm set;Other (comment) (polar care in place)     Time: 1100-1127 PT Time Calculation (min) (ACUTE ONLY): 27 min  Charges:  $Gait Training: 8-22 mins $Therapeutic Exercise: 8-22 mins                    G Codes:      Charlaine Dalton, PTA 07/19/2016, 11:44 AM

## 2016-07-19 NOTE — Progress Notes (Signed)
Patient has changed her mind about going to SNF and now she is agreeable. Clinical Social Worker (CSW) presented bed offers and patient chose [facility]. Patient is agreeable to bring her anti-viral medication from home at [facility] request. Patient is medically stable for D/C to [facility] today. Per [facility] liaison patient will go to room 506. RN will call report to Theressa Stamps at [phone] and arrange EMS for transport. [facility] authorization has been received. [facility] # [id number]. CSW sent D/C orders to [facility] via [facility]. Patient is aware of above. CSW contacted patient's roommate [name] and made him aware of above. Please reconsult if future social work needs arise. CSW signing off.   [employer], LCSW [phone]

## 2016-07-19 NOTE — Clinical Social Work Placement (Signed)
   CLINICAL SOCIAL WORK PLACEMENT  NOTE  Date:  07/19/2016  Patient Details  Name: Lisa Hawkins MRN: [id number] Date of Birth: 10/05/1951  Clinical Social Work is seeking post-discharge placement for this patient at the [place] level of care (*CSW will initial, date and re-position this form in  chart as items are completed):  Yes   Patient/family provided with [place] Work Department's list of facilities offering this level of care within the geographic area requested by the patient (or if unable, by the patient's family).  Yes   Patient/family informed of their freedom to choose among providers that offer the needed level of care, that participate in Medicare, Medicaid or managed care program needed by the patient, have an available bed and are willing to accept the patient.  Yes   Patient/family informed of [place]'s ownership interest in [facility] and [facility], as well as of the fact that they are under no obligation to receive care at these facilities.  PASRR submitted to EDS on 07/16/16     PASRR number received on 07/16/16     Existing PASRR number confirmed on       FL2 transmitted to all facilities in geographic area requested by pt/family on 07/17/16     FL2 transmitted to all facilities within larger geographic area on       Patient informed that his/her managed care company has contracts with or will negotiate with certain facilities, including the following:        Yes   Patient/family informed of bed offers received.  Patient chooses bed at  ([facility] )     Physician recommends and patient chooses bed at      Patient to be transferred to  ([facility] ) on 07/19/16.  Patient to be transferred to facility by  [facility] )     Patient family notified on 07/19/16 of transfer.  Name of family member notified:   (Patient's roommate Silverio Lay is aware of D/C today.  )     PHYSICIAN       Additional  Comment:    _______________________________________________ Mamoudou Mulvehill, Veronia Beets, LCSW 07/19/2016, 2:09 PM

## 2016-07-19 NOTE — Progress Notes (Signed)
Subjective: 4 Days Post-Op Procedure(s) (LRB): TOTAL KNEE ARTHROPLASTY (Right) Patient reports pain as 7 on 0-10 scale.   Patient is well, and has had no acute complaints or problems.  Denies any CP, SOB, ABD pain. We will continue therapy today.   Objective: Vital signs in last 24 hours: Temp:  [98 F (36.7 C)-98.8 F (37.1 C)] 98.4 F (36.9 C) (08/21 0300) Pulse Rate:  [105-130] 130 (08/21 0300) Resp:  [18] 18 (08/21 0300) BP: (133-155)/(69-76) 155/76 (08/21 0300) SpO2:  [93 %-100 %] 93 % (08/21 0300)  Intake/Output from previous day: 08/20 0701 - 08/21 0700 In: 300 [P.O.:300] Out: -  Intake/Output this shift: No intake/output data recorded.   Recent Labs  07/17/16 0514 07/18/16 0424  HGB 13.4 12.1    Recent Labs  07/17/16 0514 07/18/16 0424  WBC 14.2* 15.9*  RBC 4.06 3.63*  HCT 39.6 35.4  PLT 177 190    Recent Labs  07/17/16 0514 07/18/16 0424  NA 131* 134*  K 3.4* 3.6  CL 96* 98*  CO2 25 29  BUN 10 11  CREATININE 0.71 0.78  GLUCOSE 142* 143*  CALCIUM 8.9 9.0   No results for input(s): LABPT, INR in the last 72 hours.  EXAM General - Patient is Alert, Appropriate and Oriented.  Pt is drowsy this AM, but is arousable and answers questions appropriately. Extremity - Neurovascular intact Sensation intact distally Intact pulses distally Dorsiflexion/Plantar flexion intact No cellulitis present Compartment soft  Dressing - dressing C/D/I and wound vac intact, no drainage Motor Function - intact, moving foot and toes well on exam.   Negative Homan's to bilateral lower extremities. Systolic Murmur heard with auscultation, no wheezing or rales noted.  Past Medical History:  Diagnosis Date  . Anxiety   . GERD (gastroesophageal reflux disease)   . Hepatitis    Hep C - treated with Harvoni  . HIV (human immunodeficiency virus infection) ([place])   . Hypertension   . Neuromuscular disorder (HCC)    toes  . Neuropathy ([place])   . PONV  (postoperative nausea and vomiting)     Assessment/Plan:   4 Days Post-Op Procedure(s) (LRB): TOTAL KNEE ARTHROPLASTY (Right) Active Problems:   Primary localized osteoarthritis of right knee   Hyponatremia   Estimated body mass index is 37.02 kg/m as calculated from the following:   Height as of this encounter: 5\' 3"  (1.6 m).   Weight as of this encounter: 94.8 kg (209 lb). Advance diet Up with therapy  Needs BM, will add FLEET enema to medication regimen. Tachycardia, Most recent HR was 130, will continue IVF today and continue to monitor.  May be due to continued pain to surgical knee. Plan will be for discharge to SNF today pending BM and HR.  They will need to remove the wound VAC and apply honeycomb dressing on 07/22/2016.  She'll follow-up with [facility] and 2 weeks for staple removal. Spoke with patient's nurse about continued monitoring of her HR and BM.  DVT Prophylaxis - Lovenox, Foot Pumps and TED hose Weight-Bearing as tolerated to right leg  J. Cameron Proud, PA-C [place] 07/19/2016, 7:48 AM

## 2016-07-22 DIAGNOSIS — G9009 Other idiopathic peripheral autonomic neuropathy: Secondary | ICD-10-CM | POA: Diagnosis not present

## 2016-07-22 DIAGNOSIS — I10 Essential (primary) hypertension: Secondary | ICD-10-CM | POA: Diagnosis not present

## 2016-07-22 DIAGNOSIS — F419 Anxiety disorder, unspecified: Secondary | ICD-10-CM | POA: Diagnosis not present

## 2016-07-22 DIAGNOSIS — S82009A Unspecified fracture of unspecified patella, initial encounter for closed fracture: Secondary | ICD-10-CM | POA: Diagnosis not present

## 2016-07-22 DIAGNOSIS — B20 Human immunodeficiency virus [HIV] disease: Secondary | ICD-10-CM | POA: Diagnosis not present

## 2016-07-27 DIAGNOSIS — Z96651 Presence of right artificial knee joint: Secondary | ICD-10-CM | POA: Diagnosis not present

## 2016-07-27 DIAGNOSIS — M1711 Unilateral primary osteoarthritis, right knee: Secondary | ICD-10-CM | POA: Diagnosis not present

## 2016-07-27 DIAGNOSIS — M1712 Unilateral primary osteoarthritis, left knee: Secondary | ICD-10-CM | POA: Diagnosis not present

## 2016-07-29 DIAGNOSIS — M25569 Pain in unspecified knee: Secondary | ICD-10-CM | POA: Diagnosis not present

## 2016-07-29 DIAGNOSIS — F419 Anxiety disorder, unspecified: Secondary | ICD-10-CM | POA: Diagnosis not present

## 2016-07-29 DIAGNOSIS — G629 Polyneuropathy, unspecified: Secondary | ICD-10-CM | POA: Diagnosis not present

## 2016-07-29 DIAGNOSIS — B20 Human immunodeficiency virus [HIV] disease: Secondary | ICD-10-CM | POA: Diagnosis not present

## 2016-07-29 DIAGNOSIS — I10 Essential (primary) hypertension: Secondary | ICD-10-CM | POA: Diagnosis not present

## 2016-08-06 DIAGNOSIS — G609 Hereditary and idiopathic neuropathy, unspecified: Secondary | ICD-10-CM | POA: Diagnosis not present

## 2016-08-06 DIAGNOSIS — Z471 Aftercare following joint replacement surgery: Secondary | ICD-10-CM | POA: Diagnosis not present

## 2016-08-06 DIAGNOSIS — R41841 Cognitive communication deficit: Secondary | ICD-10-CM | POA: Diagnosis not present

## 2016-08-06 DIAGNOSIS — G709 Myoneural disorder, unspecified: Secondary | ICD-10-CM | POA: Diagnosis not present

## 2016-08-06 DIAGNOSIS — F418 Other specified anxiety disorders: Secondary | ICD-10-CM | POA: Diagnosis not present

## 2016-08-06 DIAGNOSIS — F3289 Other specified depressive episodes: Secondary | ICD-10-CM | POA: Diagnosis not present

## 2016-08-06 DIAGNOSIS — I10 Essential (primary) hypertension: Secondary | ICD-10-CM | POA: Diagnosis not present

## 2016-08-06 DIAGNOSIS — G890 Central pain syndrome: Secondary | ICD-10-CM | POA: Diagnosis not present

## 2016-08-06 DIAGNOSIS — M1991 Primary osteoarthritis, unspecified site: Secondary | ICD-10-CM | POA: Diagnosis not present

## 2016-08-10 DIAGNOSIS — R41841 Cognitive communication deficit: Secondary | ICD-10-CM | POA: Diagnosis not present

## 2016-08-10 DIAGNOSIS — M1991 Primary osteoarthritis, unspecified site: Secondary | ICD-10-CM | POA: Diagnosis not present

## 2016-08-10 DIAGNOSIS — Z471 Aftercare following joint replacement surgery: Secondary | ICD-10-CM | POA: Diagnosis not present

## 2016-08-10 DIAGNOSIS — G709 Myoneural disorder, unspecified: Secondary | ICD-10-CM | POA: Diagnosis not present

## 2016-08-10 DIAGNOSIS — G609 Hereditary and idiopathic neuropathy, unspecified: Secondary | ICD-10-CM | POA: Diagnosis not present

## 2016-08-10 DIAGNOSIS — G890 Central pain syndrome: Secondary | ICD-10-CM | POA: Diagnosis not present

## 2016-08-10 DIAGNOSIS — F3289 Other specified depressive episodes: Secondary | ICD-10-CM | POA: Diagnosis not present

## 2016-08-10 DIAGNOSIS — I10 Essential (primary) hypertension: Secondary | ICD-10-CM | POA: Diagnosis not present

## 2016-08-10 DIAGNOSIS — F418 Other specified anxiety disorders: Secondary | ICD-10-CM | POA: Diagnosis not present

## 2016-08-11 DIAGNOSIS — F418 Other specified anxiety disorders: Secondary | ICD-10-CM | POA: Diagnosis not present

## 2016-08-11 DIAGNOSIS — R41841 Cognitive communication deficit: Secondary | ICD-10-CM | POA: Diagnosis not present

## 2016-08-11 DIAGNOSIS — F3289 Other specified depressive episodes: Secondary | ICD-10-CM | POA: Diagnosis not present

## 2016-08-11 DIAGNOSIS — G609 Hereditary and idiopathic neuropathy, unspecified: Secondary | ICD-10-CM | POA: Diagnosis not present

## 2016-08-11 DIAGNOSIS — G709 Myoneural disorder, unspecified: Secondary | ICD-10-CM | POA: Diagnosis not present

## 2016-08-11 DIAGNOSIS — I10 Essential (primary) hypertension: Secondary | ICD-10-CM | POA: Diagnosis not present

## 2016-08-11 DIAGNOSIS — M1991 Primary osteoarthritis, unspecified site: Secondary | ICD-10-CM | POA: Diagnosis not present

## 2016-08-11 DIAGNOSIS — G890 Central pain syndrome: Secondary | ICD-10-CM | POA: Diagnosis not present

## 2016-08-11 DIAGNOSIS — Z471 Aftercare following joint replacement surgery: Secondary | ICD-10-CM | POA: Diagnosis not present

## 2016-08-12 DIAGNOSIS — F418 Other specified anxiety disorders: Secondary | ICD-10-CM | POA: Diagnosis not present

## 2016-08-12 DIAGNOSIS — G709 Myoneural disorder, unspecified: Secondary | ICD-10-CM | POA: Diagnosis not present

## 2016-08-12 DIAGNOSIS — F3289 Other specified depressive episodes: Secondary | ICD-10-CM | POA: Diagnosis not present

## 2016-08-12 DIAGNOSIS — Z471 Aftercare following joint replacement surgery: Secondary | ICD-10-CM | POA: Diagnosis not present

## 2016-08-12 DIAGNOSIS — G890 Central pain syndrome: Secondary | ICD-10-CM | POA: Diagnosis not present

## 2016-08-12 DIAGNOSIS — G609 Hereditary and idiopathic neuropathy, unspecified: Secondary | ICD-10-CM | POA: Diagnosis not present

## 2016-08-12 DIAGNOSIS — R41841 Cognitive communication deficit: Secondary | ICD-10-CM | POA: Diagnosis not present

## 2016-08-12 DIAGNOSIS — I10 Essential (primary) hypertension: Secondary | ICD-10-CM | POA: Diagnosis not present

## 2016-08-12 DIAGNOSIS — M1991 Primary osteoarthritis, unspecified site: Secondary | ICD-10-CM | POA: Diagnosis not present

## 2016-08-16 ENCOUNTER — Other Ambulatory Visit: Payer: Self-pay | Admitting: Orthopedic Surgery

## 2016-08-16 DIAGNOSIS — M1712 Unilateral primary osteoarthritis, left knee: Secondary | ICD-10-CM

## 2016-08-17 DIAGNOSIS — I10 Essential (primary) hypertension: Secondary | ICD-10-CM | POA: Diagnosis not present

## 2016-08-17 DIAGNOSIS — R41841 Cognitive communication deficit: Secondary | ICD-10-CM | POA: Diagnosis not present

## 2016-08-17 DIAGNOSIS — M1991 Primary osteoarthritis, unspecified site: Secondary | ICD-10-CM | POA: Diagnosis not present

## 2016-08-17 DIAGNOSIS — G890 Central pain syndrome: Secondary | ICD-10-CM | POA: Diagnosis not present

## 2016-08-17 DIAGNOSIS — Z471 Aftercare following joint replacement surgery: Secondary | ICD-10-CM | POA: Diagnosis not present

## 2016-08-17 DIAGNOSIS — F418 Other specified anxiety disorders: Secondary | ICD-10-CM | POA: Diagnosis not present

## 2016-08-17 DIAGNOSIS — G709 Myoneural disorder, unspecified: Secondary | ICD-10-CM | POA: Diagnosis not present

## 2016-08-17 DIAGNOSIS — F3289 Other specified depressive episodes: Secondary | ICD-10-CM | POA: Diagnosis not present

## 2016-08-17 DIAGNOSIS — G609 Hereditary and idiopathic neuropathy, unspecified: Secondary | ICD-10-CM | POA: Diagnosis not present

## 2016-08-19 DIAGNOSIS — R41841 Cognitive communication deficit: Secondary | ICD-10-CM | POA: Diagnosis not present

## 2016-08-19 DIAGNOSIS — M1991 Primary osteoarthritis, unspecified site: Secondary | ICD-10-CM | POA: Diagnosis not present

## 2016-08-19 DIAGNOSIS — G609 Hereditary and idiopathic neuropathy, unspecified: Secondary | ICD-10-CM | POA: Diagnosis not present

## 2016-08-19 DIAGNOSIS — F418 Other specified anxiety disorders: Secondary | ICD-10-CM | POA: Diagnosis not present

## 2016-08-19 DIAGNOSIS — F3289 Other specified depressive episodes: Secondary | ICD-10-CM | POA: Diagnosis not present

## 2016-08-19 DIAGNOSIS — I10 Essential (primary) hypertension: Secondary | ICD-10-CM | POA: Diagnosis not present

## 2016-08-19 DIAGNOSIS — G890 Central pain syndrome: Secondary | ICD-10-CM | POA: Diagnosis not present

## 2016-08-19 DIAGNOSIS — G709 Myoneural disorder, unspecified: Secondary | ICD-10-CM | POA: Diagnosis not present

## 2016-08-19 DIAGNOSIS — Z471 Aftercare following joint replacement surgery: Secondary | ICD-10-CM | POA: Diagnosis not present

## 2016-08-24 DIAGNOSIS — F418 Other specified anxiety disorders: Secondary | ICD-10-CM | POA: Diagnosis not present

## 2016-08-24 DIAGNOSIS — R41841 Cognitive communication deficit: Secondary | ICD-10-CM | POA: Diagnosis not present

## 2016-08-24 DIAGNOSIS — G709 Myoneural disorder, unspecified: Secondary | ICD-10-CM | POA: Diagnosis not present

## 2016-08-24 DIAGNOSIS — Z471 Aftercare following joint replacement surgery: Secondary | ICD-10-CM | POA: Diagnosis not present

## 2016-08-24 DIAGNOSIS — I10 Essential (primary) hypertension: Secondary | ICD-10-CM | POA: Diagnosis not present

## 2016-08-24 DIAGNOSIS — F3289 Other specified depressive episodes: Secondary | ICD-10-CM | POA: Diagnosis not present

## 2016-08-24 DIAGNOSIS — G609 Hereditary and idiopathic neuropathy, unspecified: Secondary | ICD-10-CM | POA: Diagnosis not present

## 2016-08-24 DIAGNOSIS — G890 Central pain syndrome: Secondary | ICD-10-CM | POA: Diagnosis not present

## 2016-08-24 DIAGNOSIS — M1991 Primary osteoarthritis, unspecified site: Secondary | ICD-10-CM | POA: Diagnosis not present

## 2016-08-25 ENCOUNTER — Ambulatory Visit: Payer: Medicare HMO

## 2016-08-26 DIAGNOSIS — F418 Other specified anxiety disorders: Secondary | ICD-10-CM | POA: Diagnosis not present

## 2016-08-26 DIAGNOSIS — Z471 Aftercare following joint replacement surgery: Secondary | ICD-10-CM | POA: Diagnosis not present

## 2016-08-26 DIAGNOSIS — G609 Hereditary and idiopathic neuropathy, unspecified: Secondary | ICD-10-CM | POA: Diagnosis not present

## 2016-08-26 DIAGNOSIS — I10 Essential (primary) hypertension: Secondary | ICD-10-CM | POA: Diagnosis not present

## 2016-08-26 DIAGNOSIS — M1991 Primary osteoarthritis, unspecified site: Secondary | ICD-10-CM | POA: Diagnosis not present

## 2016-08-26 DIAGNOSIS — F3289 Other specified depressive episodes: Secondary | ICD-10-CM | POA: Diagnosis not present

## 2016-08-26 DIAGNOSIS — G890 Central pain syndrome: Secondary | ICD-10-CM | POA: Diagnosis not present

## 2016-08-26 DIAGNOSIS — R41841 Cognitive communication deficit: Secondary | ICD-10-CM | POA: Diagnosis not present

## 2016-08-26 DIAGNOSIS — G709 Myoneural disorder, unspecified: Secondary | ICD-10-CM | POA: Diagnosis not present

## 2016-08-30 ENCOUNTER — Ambulatory Visit
Admission: RE | Admit: 2016-08-30 | Discharge: 2016-08-30 | Disposition: A | Discharge status: Home / Self Care | Payer: Commercial Managed Care - HMO | Source: Ambulatory Visit | Attending: Orthopedic Surgery | Admitting: Orthopedic Surgery

## 2016-08-30 DIAGNOSIS — M1712 Unilateral primary osteoarthritis, left knee: Secondary | ICD-10-CM | POA: Diagnosis not present

## 2016-08-30 DIAGNOSIS — M179 Osteoarthritis of knee, unspecified: Secondary | ICD-10-CM | POA: Diagnosis not present

## 2016-09-01 DIAGNOSIS — F3289 Other specified depressive episodes: Secondary | ICD-10-CM | POA: Diagnosis not present

## 2016-09-01 DIAGNOSIS — M1991 Primary osteoarthritis, unspecified site: Secondary | ICD-10-CM | POA: Diagnosis not present

## 2016-09-01 DIAGNOSIS — G609 Hereditary and idiopathic neuropathy, unspecified: Secondary | ICD-10-CM | POA: Diagnosis not present

## 2016-09-01 DIAGNOSIS — R41841 Cognitive communication deficit: Secondary | ICD-10-CM | POA: Diagnosis not present

## 2016-09-01 DIAGNOSIS — Z471 Aftercare following joint replacement surgery: Secondary | ICD-10-CM | POA: Diagnosis not present

## 2016-09-01 DIAGNOSIS — I10 Essential (primary) hypertension: Secondary | ICD-10-CM | POA: Diagnosis not present

## 2016-09-01 DIAGNOSIS — G709 Myoneural disorder, unspecified: Secondary | ICD-10-CM | POA: Diagnosis not present

## 2016-09-01 DIAGNOSIS — F418 Other specified anxiety disorders: Secondary | ICD-10-CM | POA: Diagnosis not present

## 2016-09-01 DIAGNOSIS — G890 Central pain syndrome: Secondary | ICD-10-CM | POA: Diagnosis not present

## 2016-09-02 DIAGNOSIS — F418 Other specified anxiety disorders: Secondary | ICD-10-CM | POA: Diagnosis not present

## 2016-09-02 DIAGNOSIS — R41841 Cognitive communication deficit: Secondary | ICD-10-CM | POA: Diagnosis not present

## 2016-09-02 DIAGNOSIS — F3289 Other specified depressive episodes: Secondary | ICD-10-CM | POA: Diagnosis not present

## 2016-09-02 DIAGNOSIS — I10 Essential (primary) hypertension: Secondary | ICD-10-CM | POA: Diagnosis not present

## 2016-09-02 DIAGNOSIS — M1991 Primary osteoarthritis, unspecified site: Secondary | ICD-10-CM | POA: Diagnosis not present

## 2016-09-02 DIAGNOSIS — Z471 Aftercare following joint replacement surgery: Secondary | ICD-10-CM | POA: Diagnosis not present

## 2016-09-02 DIAGNOSIS — G609 Hereditary and idiopathic neuropathy, unspecified: Secondary | ICD-10-CM | POA: Diagnosis not present

## 2016-09-02 DIAGNOSIS — G709 Myoneural disorder, unspecified: Secondary | ICD-10-CM | POA: Diagnosis not present

## 2016-09-02 DIAGNOSIS — G890 Central pain syndrome: Secondary | ICD-10-CM | POA: Diagnosis not present

## 2016-10-13 ENCOUNTER — Inpatient Hospital Stay: Admission: RE | Admit: 2016-10-13 | Payer: Self-pay | Source: Ambulatory Visit

## 2016-10-13 DIAGNOSIS — Z96651 Presence of right artificial knee joint: Secondary | ICD-10-CM | POA: Diagnosis not present

## 2016-10-13 DIAGNOSIS — M1712 Unilateral primary osteoarthritis, left knee: Secondary | ICD-10-CM | POA: Diagnosis not present

## 2016-10-13 DIAGNOSIS — S82034A Nondisplaced transverse fracture of right patella, initial encounter for closed fracture: Secondary | ICD-10-CM | POA: Diagnosis not present

## 2016-10-20 ENCOUNTER — Encounter
Admission: RE | Admit: 2016-10-20 | Discharge: 2016-10-20 | Disposition: A | Discharge status: Home / Self Care | Payer: Commercial Managed Care - HMO | Source: Ambulatory Visit | Attending: Orthopedic Surgery | Admitting: Orthopedic Surgery

## 2016-10-20 DIAGNOSIS — G629 Polyneuropathy, unspecified: Secondary | ICD-10-CM | POA: Insufficient documentation

## 2016-10-20 DIAGNOSIS — M1990 Unspecified osteoarthritis, unspecified site: Secondary | ICD-10-CM | POA: Diagnosis not present

## 2016-10-20 DIAGNOSIS — F419 Anxiety disorder, unspecified: Secondary | ICD-10-CM | POA: Insufficient documentation

## 2016-10-20 DIAGNOSIS — K219 Gastro-esophageal reflux disease without esophagitis: Secondary | ICD-10-CM | POA: Insufficient documentation

## 2016-10-20 DIAGNOSIS — I10 Essential (primary) hypertension: Secondary | ICD-10-CM | POA: Diagnosis not present

## 2016-10-20 DIAGNOSIS — Z01812 Encounter for preprocedural laboratory examination: Secondary | ICD-10-CM | POA: Diagnosis not present

## 2016-10-20 DIAGNOSIS — G709 Myoneural disorder, unspecified: Secondary | ICD-10-CM | POA: Diagnosis not present

## 2016-10-20 DIAGNOSIS — B1920 Unspecified viral hepatitis C without hepatic coma: Secondary | ICD-10-CM | POA: Insufficient documentation

## 2016-10-20 DIAGNOSIS — B20 Human immunodeficiency virus [HIV] disease: Secondary | ICD-10-CM | POA: Insufficient documentation

## 2016-10-20 HISTORY — DX: Unspecified osteoarthritis, unspecified site: M19.90

## 2016-10-20 LAB — URINALYSIS COMPLETE WITH MICROSCOPIC (ARMC ONLY)
Bilirubin Urine: NEGATIVE
Glucose, UA: NEGATIVE mg/dL
Hgb urine dipstick: NEGATIVE
Ketones, ur: NEGATIVE mg/dL
Nitrite: NEGATIVE
PH: 6 (ref 5.0–8.0)
PROTEIN: NEGATIVE mg/dL
Specific Gravity, Urine: 1.011 (ref 1.005–1.030)

## 2016-10-20 LAB — CBC
HEMATOCRIT: 45 % (ref 35.0–47.0)
Hemoglobin: 15.3 g/dL (ref 12.0–16.0)
MCH: 32.2 pg (ref 26.0–34.0)
MCHC: 34 g/dL (ref 32.0–36.0)
MCV: 94.7 fL (ref 80.0–100.0)
Platelets: 302 10*3/uL (ref 150–440)
RBC: 4.75 MIL/uL (ref 3.80–5.20)
RDW: 16.3 % — AB (ref 11.5–14.5)
WBC: 10.1 10*3/uL (ref 3.6–11.0)

## 2016-10-20 LAB — SURGICAL PCR SCREEN
MRSA, PCR: NEGATIVE
STAPHYLOCOCCUS AUREUS: POSITIVE — AB

## 2016-10-20 LAB — COMPREHENSIVE METABOLIC PANEL
ALBUMIN: 4 g/dL (ref 3.5–5.0)
ALT: 23 U/L (ref 14–54)
AST: 27 U/L (ref 15–41)
Alkaline Phosphatase: 146 U/L — ABNORMAL HIGH (ref 38–126)
Anion gap: 10 (ref 5–15)
BUN: 8 mg/dL (ref 6–20)
CHLORIDE: 98 mmol/L — AB (ref 101–111)
CO2: 27 mmol/L (ref 22–32)
CREATININE: 0.91 mg/dL (ref 0.44–1.00)
Calcium: 9.5 mg/dL (ref 8.9–10.3)
GFR calc Af Amer: >60 mL/min (ref >60)
GFR calc non Af Amer: >60 mL/min (ref >60)
GLUCOSE: 84 mg/dL (ref 65–99)
POTASSIUM: 3.4 mmol/L — AB (ref 3.5–5.1)
SODIUM: 135 mmol/L (ref 135–145)
Total Bilirubin: 0.5 mg/dL (ref 0.3–1.2)
Total Protein: 8.4 g/dL — ABNORMAL HIGH (ref 6.5–8.1)

## 2016-10-20 LAB — APTT: aPTT: 30 seconds (ref 24–36)

## 2016-10-20 LAB — PROTIME-INR
INR: 1.1
Prothrombin Time: 14.2 seconds (ref 11.4–15.2)

## 2016-10-20 LAB — TYPE AND SCREEN
ABO/RH(D): B POS
ANTIBODY SCREEN: NEGATIVE

## 2016-10-20 LAB — SEDIMENTATION RATE: Sed Rate: 11 mm/hr (ref 0–30)

## 2016-10-20 NOTE — Patient Instructions (Signed)
  Your procedure is scheduled on: 10/26/16 Report to Day Surgery.[facility] SECOND FLOOR To find out your arrival time please call [phone] between 1PM - 3PM on 10/25/16 Remember: Instructions that are not followed completely may result in serious medical risk, up to and including death, or upon the discretion of your surgeon and anesthesiologist your surgery may need to be rescheduled.    __X__ 1. Do not eat food or drink liquids after midnight. No gum chewing or hard candies.     __X__ 2. No Alcohol for 24 hours before or after surgery.   X____ 3. Do Not Smoke For 24 Hours Prior to Your Surgery.   ____ 4. Bring all medications with you on the day of surgery if instructed.    __X__ 5. Notify your doctor if there is any change in your medical condition     (cold, fever, infections).       Do not wear jewelry, make-up, hairpins, clips or nail polish.  Do not wear lotions, powders, or perfumes. You may wear deodorant.  Do not shave 48 hours prior to surgery. Men may shave face and neck.  Do not bring valuables to the hospital.    [facility] is not responsible for any belongings or valuables.               Contacts, dentures or bridgework may not be worn into surgery.  Leave your suitcase in the car. After surgery it may be brought to your room.  For patients admitted to the hospital, discharge time is determined by your                treatment team.   Patients discharged the day of surgery will not be allowed to drive home.   Please read over the following fact sheets that you were given:   MRSA Information   _X___ Take these medicines the morning of surgery with A SIP OF WATER:    1.AMITRIPTYLINE  2. GABAPENTIN  3.   4.  5.  6.  ____ Fleet Enema (as directed)   __X__ Use CHG Soap as directed  ____ Use inhalers on the day of surgery  ____ Stop metformin 2 days prior to surgery    ____ Take 1/2 of usual insulin dose the night before surgery and none on the  morning of surgery.   __X__ Stop Coumadin/Plavix/aspirin on       STOP ASPIRIN 1 WEEK BEFORE SURGERY ____ Stop Anti-inflammatories on   ____ Stop supplements until after surgery.    ____ Bring C-Pap to the hospital.

## 2016-10-20 NOTE — Pre-Procedure Instructions (Signed)
HR NOTED > 100 ON EKG  8/17/ PRIOR TO TKR

## 2016-10-22 LAB — URINE CULTURE

## 2016-10-25 MED ORDER — CLINDAMYCIN PHOSPHATE 900 MG/50ML IV SOLN
900.0000 mg | Freq: Once | INTRAVENOUS | Status: AC
  Administered 2016-10-26: 900 mg via INTRAVENOUS

## 2016-10-25 MED ORDER — TRANEXAMIC ACID 1000 MG/10ML IV SOLN
1000.0000 mg | INTRAVENOUS | Status: DC
  Filled 2016-10-25: qty 10

## 2016-10-25 NOTE — Pre-Procedure Instructions (Signed)
Faxed notification of urine culture and sensitivity to Dr. Theodore Demark office.  Preop antibiotic not sensitive.  Requested change of antibiotic. Patient has severe allergy to penicillin.

## 2016-10-26 ENCOUNTER — Encounter
Admission: RE | Disposition: A | Discharge status: Skilled Nursing Facility | Payer: Self-pay | Source: Ambulatory Visit | Attending: Orthopedic Surgery

## 2016-10-26 ENCOUNTER — Inpatient Hospital Stay: Payer: Commercial Managed Care - HMO | Admitting: Anesthesiology

## 2016-10-26 ENCOUNTER — Inpatient Hospital Stay
Admission: RE | Admit: 2016-10-26 | Discharge: 2016-10-29 | DRG: 469 | Disposition: A | Discharge status: Skilled Nursing Facility | Payer: Commercial Managed Care - HMO | Source: Ambulatory Visit | Attending: Orthopedic Surgery | Admitting: Orthopedic Surgery

## 2016-10-26 ENCOUNTER — Inpatient Hospital Stay: Payer: Commercial Managed Care - HMO

## 2016-10-26 ENCOUNTER — Encounter: Payer: Self-pay | Admitting: *Deleted

## 2016-10-26 DIAGNOSIS — K219 Gastro-esophageal reflux disease without esophagitis: Secondary | ICD-10-CM | POA: Diagnosis present

## 2016-10-26 DIAGNOSIS — Z5189 Encounter for other specified aftercare: Secondary | ICD-10-CM | POA: Diagnosis not present

## 2016-10-26 DIAGNOSIS — R062 Wheezing: Secondary | ICD-10-CM | POA: Diagnosis not present

## 2016-10-26 DIAGNOSIS — Z79899 Other long term (current) drug therapy: Secondary | ICD-10-CM | POA: Diagnosis not present

## 2016-10-26 DIAGNOSIS — M25561 Pain in right knee: Secondary | ICD-10-CM

## 2016-10-26 DIAGNOSIS — G8929 Other chronic pain: Secondary | ICD-10-CM

## 2016-10-26 DIAGNOSIS — F419 Anxiety disorder, unspecified: Secondary | ICD-10-CM | POA: Diagnosis present

## 2016-10-26 DIAGNOSIS — M25562 Pain in left knee: Secondary | ICD-10-CM | POA: Diagnosis not present

## 2016-10-26 DIAGNOSIS — Y92239 Unspecified place in hospital as the place of occurrence of the external cause: Secondary | ICD-10-CM | POA: Diagnosis not present

## 2016-10-26 DIAGNOSIS — J9601 Acute respiratory failure with hypoxia: Secondary | ICD-10-CM | POA: Diagnosis not present

## 2016-10-26 DIAGNOSIS — R0600 Dyspnea, unspecified: Secondary | ICD-10-CM

## 2016-10-26 DIAGNOSIS — R55 Syncope and collapse: Secondary | ICD-10-CM | POA: Diagnosis not present

## 2016-10-26 DIAGNOSIS — S82009D Unspecified fracture of unspecified patella, subsequent encounter for closed fracture with routine healing: Secondary | ICD-10-CM

## 2016-10-26 DIAGNOSIS — B20 Human immunodeficiency virus [HIV] disease: Secondary | ICD-10-CM | POA: Diagnosis not present

## 2016-10-26 DIAGNOSIS — B1920 Unspecified viral hepatitis C without hepatic coma: Secondary | ICD-10-CM | POA: Diagnosis not present

## 2016-10-26 DIAGNOSIS — Z21 Asymptomatic human immunodeficiency virus [HIV] infection status: Secondary | ICD-10-CM | POA: Diagnosis present

## 2016-10-26 DIAGNOSIS — R2689 Other abnormalities of gait and mobility: Secondary | ICD-10-CM | POA: Diagnosis not present

## 2016-10-26 DIAGNOSIS — G609 Hereditary and idiopathic neuropathy, unspecified: Secondary | ICD-10-CM | POA: Diagnosis not present

## 2016-10-26 DIAGNOSIS — E871 Hypo-osmolality and hyponatremia: Secondary | ICD-10-CM | POA: Diagnosis not present

## 2016-10-26 DIAGNOSIS — Z8249 Family history of ischemic heart disease and other diseases of the circulatory system: Secondary | ICD-10-CM

## 2016-10-26 DIAGNOSIS — M13862 Other specified arthritis, left knee: Secondary | ICD-10-CM | POA: Diagnosis not present

## 2016-10-26 DIAGNOSIS — M1990 Unspecified osteoarthritis, unspecified site: Secondary | ICD-10-CM | POA: Diagnosis present

## 2016-10-26 DIAGNOSIS — R4182 Altered mental status, unspecified: Secondary | ICD-10-CM | POA: Diagnosis not present

## 2016-10-26 DIAGNOSIS — R410 Disorientation, unspecified: Secondary | ICD-10-CM | POA: Diagnosis not present

## 2016-10-26 DIAGNOSIS — R160 Hepatomegaly, not elsewhere classified: Secondary | ICD-10-CM | POA: Diagnosis not present

## 2016-10-26 DIAGNOSIS — Z96652 Presence of left artificial knee joint: Secondary | ICD-10-CM | POA: Diagnosis not present

## 2016-10-26 DIAGNOSIS — T40605A Adverse effect of unspecified narcotics, initial encounter: Secondary | ICD-10-CM | POA: Diagnosis not present

## 2016-10-26 DIAGNOSIS — M1712 Unilateral primary osteoarthritis, left knee: Principal | ICD-10-CM | POA: Diagnosis present

## 2016-10-26 DIAGNOSIS — T402X5A Adverse effect of other opioids, initial encounter: Secondary | ICD-10-CM | POA: Diagnosis not present

## 2016-10-26 DIAGNOSIS — I10 Essential (primary) hypertension: Secondary | ICD-10-CM | POA: Diagnosis not present

## 2016-10-26 DIAGNOSIS — J209 Acute bronchitis, unspecified: Secondary | ICD-10-CM | POA: Diagnosis not present

## 2016-10-26 DIAGNOSIS — G8918 Other acute postprocedural pain: Secondary | ICD-10-CM

## 2016-10-26 DIAGNOSIS — Z7982 Long term (current) use of aspirin: Secondary | ICD-10-CM | POA: Diagnosis not present

## 2016-10-26 DIAGNOSIS — F17210 Nicotine dependence, cigarettes, uncomplicated: Secondary | ICD-10-CM | POA: Diagnosis present

## 2016-10-26 DIAGNOSIS — Z880 Allergy status to penicillin: Secondary | ICD-10-CM | POA: Diagnosis not present

## 2016-10-26 DIAGNOSIS — Z7401 Bed confinement status: Secondary | ICD-10-CM | POA: Diagnosis not present

## 2016-10-26 DIAGNOSIS — Z79891 Long term (current) use of opiate analgesic: Secondary | ICD-10-CM | POA: Diagnosis not present

## 2016-10-26 DIAGNOSIS — M6281 Muscle weakness (generalized): Secondary | ICD-10-CM | POA: Diagnosis not present

## 2016-10-26 DIAGNOSIS — Z23 Encounter for immunization: Secondary | ICD-10-CM | POA: Diagnosis not present

## 2016-10-26 DIAGNOSIS — D72829 Elevated white blood cell count, unspecified: Secondary | ICD-10-CM | POA: Diagnosis not present

## 2016-10-26 DIAGNOSIS — R262 Difficulty in walking, not elsewhere classified: Secondary | ICD-10-CM

## 2016-10-26 DIAGNOSIS — Z471 Aftercare following joint replacement surgery: Secondary | ICD-10-CM | POA: Diagnosis not present

## 2016-10-26 HISTORY — PX: TOTAL KNEE ARTHROPLASTY: SHX125

## 2016-10-26 LAB — CBC
HCT: 42.8 % (ref 35.0–47.0)
Hemoglobin: 14.5 g/dL (ref 12.0–16.0)
MCH: 32.3 pg (ref 26.0–34.0)
MCHC: 33.8 g/dL (ref 32.0–36.0)
MCV: 95.5 fL (ref 80.0–100.0)
Platelets: 268 10*3/uL (ref 150–440)
RBC: 4.48 MIL/uL (ref 3.80–5.20)
RDW: 16.1 % — ABNORMAL HIGH (ref 11.5–14.5)
WBC: 6.4 10*3/uL (ref 3.6–11.0)

## 2016-10-26 LAB — CREATININE, SERUM
CREATININE: 0.99 mg/dL (ref 0.44–1.00)
GFR calc non Af Amer: 59 mL/min — ABNORMAL LOW (ref >60)

## 2016-10-26 SURGERY — ARTHROPLASTY, KNEE, TOTAL
Anesthesia: Spinal | Laterality: Left | Wound class: Clean

## 2016-10-26 MED ORDER — MORPHINE SULFATE (PF) 10 MG/ML IV SOLN
INTRAVENOUS | Status: AC
  Filled 2016-10-26: qty 1

## 2016-10-26 MED ORDER — ACETAMINOPHEN 325 MG PO TABS
650.0000 mg | ORAL_TABLET | Freq: Four times a day (QID) | ORAL | Status: DC | PRN
  Administered 2016-10-27 – 2016-10-29 (×3): 650 mg via ORAL
  Filled 2016-10-26 (×3): qty 2

## 2016-10-26 MED ORDER — OXYCODONE HCL 5 MG/5ML PO SOLN: 5.0000 mg | Freq: Once | ORAL | Status: DC | PRN

## 2016-10-26 MED ORDER — METOCLOPRAMIDE HCL 5 MG/ML IJ SOLN: 5.0000 mg | Freq: Three times a day (TID) | INTRAMUSCULAR | Status: DC | PRN

## 2016-10-26 MED ORDER — MORPHINE SULFATE (PF) 4 MG/ML IV SOLN
4.0000 mg | INTRAVENOUS | Status: DC | PRN
  Administered 2016-10-26 – 2016-10-27 (×4): 4 mg via INTRAVENOUS
  Filled 2016-10-26 (×4): qty 1

## 2016-10-26 MED ORDER — SODIUM CHLORIDE 0.9 % IJ SOLN
INTRAMUSCULAR | Status: DC | PRN
  Administered 2016-10-26: 28 mL via INTRAVENOUS

## 2016-10-26 MED ORDER — AMLODIPINE BESYLATE 10 MG PO TABS
10.0000 mg | ORAL_TABLET | Freq: Every day | ORAL | Status: DC
  Administered 2016-10-26 – 2016-10-28 (×3): 10 mg via ORAL
  Filled 2016-10-26 (×3): qty 1

## 2016-10-26 MED ORDER — AMITRIPTYLINE HCL 25 MG PO TABS
25.0000 mg | ORAL_TABLET | Freq: Three times a day (TID) | ORAL | Status: DC
  Administered 2016-10-26 – 2016-10-28 (×6): 25 mg via ORAL
  Filled 2016-10-26 (×6): qty 1

## 2016-10-26 MED ORDER — MIRTAZAPINE 15 MG PO TABS
30.0000 mg | ORAL_TABLET | Freq: Every day | ORAL | Status: DC
  Administered 2016-10-26 – 2016-10-27 (×2): 30 mg via ORAL
  Filled 2016-10-26 (×2): qty 2

## 2016-10-26 MED ORDER — ASPIRIN EC 81 MG PO TBEC
81.0000 mg | DELAYED_RELEASE_TABLET | Freq: Every day | ORAL | Status: DC
  Administered 2016-10-26 – 2016-10-28 (×3): 81 mg via ORAL
  Filled 2016-10-26 (×3): qty 1

## 2016-10-26 MED ORDER — ZOLPIDEM TARTRATE 5 MG PO TABS
5.0000 mg | ORAL_TABLET | Freq: Every evening | ORAL | Status: DC | PRN
  Administered 2016-10-27: 5 mg via ORAL
  Filled 2016-10-26: qty 1

## 2016-10-26 MED ORDER — SODIUM CHLORIDE 0.9 % IJ SOLN
INTRAMUSCULAR | Status: AC
  Filled 2016-10-26: qty 50

## 2016-10-26 MED ORDER — SODIUM CHLORIDE 0.9 % IV SOLN
INTRAVENOUS | Status: DC | PRN
  Administered 2016-10-26: 60 mL

## 2016-10-26 MED ORDER — MIDAZOLAM HCL 5 MG/5ML IJ SOLN
INTRAMUSCULAR | Status: DC | PRN
  Administered 2016-10-26: 2 mg via INTRAVENOUS

## 2016-10-26 MED ORDER — HYDROCHLOROTHIAZIDE 25 MG PO TABS
25.0000 mg | ORAL_TABLET | Freq: Every day | ORAL | Status: DC
  Administered 2016-10-26 – 2016-10-28 (×3): 25 mg via ORAL
  Filled 2016-10-26 (×3): qty 1

## 2016-10-26 MED ORDER — MENTHOL 3 MG MT LOZG
1.0000 | LOZENGE | OROMUCOSAL | Status: DC | PRN
  Filled 2016-10-26: qty 9

## 2016-10-26 MED ORDER — FAMOTIDINE 20 MG PO TABS
ORAL_TABLET | ORAL | Status: AC
  Filled 2016-10-26: qty 1

## 2016-10-26 MED ORDER — OXYCODONE HCL ER 15 MG PO T12A
15.0000 mg | EXTENDED_RELEASE_TABLET | Freq: Two times a day (BID) | ORAL | Status: DC
  Administered 2016-10-26 – 2016-10-28 (×4): 15 mg via ORAL
  Filled 2016-10-26 (×4): qty 1

## 2016-10-26 MED ORDER — MAGNESIUM CITRATE PO SOLN
1.0000 | Freq: Once | ORAL | Status: DC | PRN
  Filled 2016-10-26: qty 296

## 2016-10-26 MED ORDER — SODIUM CHLORIDE 0.9 % IV SOLN
INTRAVENOUS | Status: DC
  Administered 2016-10-26 – 2016-10-27 (×2): via INTRAVENOUS

## 2016-10-26 MED ORDER — METOCLOPRAMIDE HCL 10 MG PO TABS: 5.0000 mg | ORAL_TABLET | Freq: Three times a day (TID) | ORAL | Status: DC | PRN

## 2016-10-26 MED ORDER — OXYCODONE HCL 5 MG PO TABS: 5.0000 mg | ORAL_TABLET | Freq: Once | ORAL | Status: DC | PRN

## 2016-10-26 MED ORDER — OXYCODONE HCL 5 MG PO TABS
5.0000 mg | ORAL_TABLET | ORAL | Status: DC | PRN
Start: 2016-10-26 — End: 2016-10-28
  Administered 2016-10-26 – 2016-10-27 (×2): 10 mg via ORAL
  Filled 2016-10-26 (×2): qty 2

## 2016-10-26 MED ORDER — PROPOFOL 500 MG/50ML IV EMUL
INTRAVENOUS | Status: DC | PRN
  Administered 2016-10-26: 50 ug/kg/min via INTRAVENOUS

## 2016-10-26 MED ORDER — ONDANSETRON HCL 4 MG PO TABS: 4.0000 mg | ORAL_TABLET | Freq: Four times a day (QID) | ORAL | Status: DC | PRN

## 2016-10-26 MED ORDER — GABAPENTIN 300 MG PO CAPS
900.0000 mg | ORAL_CAPSULE | Freq: Two times a day (BID) | ORAL | Status: DC
  Administered 2016-10-26 – 2016-10-28 (×5): 900 mg via ORAL
  Filled 2016-10-26 (×5): qty 3

## 2016-10-26 MED ORDER — ENOXAPARIN SODIUM 30 MG/0.3ML ~~LOC~~ SOLN
30.0000 mg | Freq: Two times a day (BID) | SUBCUTANEOUS | Status: DC
  Administered 2016-10-27 – 2016-10-29 (×5): 30 mg via SUBCUTANEOUS
  Filled 2016-10-26 (×5): qty 0.3

## 2016-10-26 MED ORDER — BUPIVACAINE-EPINEPHRINE (PF) 0.25% -1:200000 IJ SOLN
INTRAMUSCULAR | Status: AC
  Filled 2016-10-26: qty 30

## 2016-10-26 MED ORDER — ONDANSETRON HCL 4 MG/2ML IJ SOLN: 4.0000 mg | Freq: Four times a day (QID) | INTRAMUSCULAR | Status: DC | PRN

## 2016-10-26 MED ORDER — KETAMINE HCL 50 MG/ML IJ SOLN
INTRAMUSCULAR | Status: DC | PRN
  Administered 2016-10-26: 50 mg via INTRAMUSCULAR

## 2016-10-26 MED ORDER — PHENOL 1.4 % MT LIQD
1.0000 | OROMUCOSAL | Status: DC | PRN
Start: 2016-10-26 — End: 2016-10-29
  Filled 2016-10-26: qty 177

## 2016-10-26 MED ORDER — TRANEXAMIC ACID 1000 MG/10ML IV SOLN
INTRAVENOUS | Status: DC | PRN
  Administered 2016-10-26: 1000 mg via INTRAVENOUS

## 2016-10-26 MED ORDER — CITALOPRAM HYDROBROMIDE 20 MG PO TABS
10.0000 mg | ORAL_TABLET | Freq: Every day | ORAL | Status: DC
  Administered 2016-10-26 – 2016-10-28 (×3): 10 mg via ORAL
  Filled 2016-10-26 (×3): qty 1

## 2016-10-26 MED ORDER — LACTATED RINGERS IV SOLN
INTRAVENOUS | Status: DC
Start: 2016-10-26 — End: 2016-10-26
  Administered 2016-10-26 (×3): via INTRAVENOUS

## 2016-10-26 MED ORDER — ACETAMINOPHEN 650 MG RE SUPP
650.0000 mg | Freq: Four times a day (QID) | RECTAL | Status: DC | PRN
  Administered 2016-10-28: 650 mg via RECTAL
  Filled 2016-10-26: qty 1

## 2016-10-26 MED ORDER — MAGNESIUM HYDROXIDE 400 MG/5ML PO SUSP
30.0000 mL | Freq: Every day | ORAL | Status: DC | PRN
  Administered 2016-10-28 – 2016-10-29 (×2): 30 mL via ORAL
  Filled 2016-10-26 (×2): qty 30

## 2016-10-26 MED ORDER — ABACAVIR-DOLUTEGRAVIR-LAMIVUD 600-50-300 MG PO TABS
1.0000 | ORAL_TABLET | Freq: Every day | ORAL | Status: DC
  Administered 2016-10-26 – 2016-10-28 (×3): 1 via ORAL
  Filled 2016-10-26 (×4): qty 1

## 2016-10-26 MED ORDER — DOCUSATE SODIUM 100 MG PO CAPS
100.0000 mg | ORAL_CAPSULE | Freq: Two times a day (BID) | ORAL | Status: DC
  Administered 2016-10-26 – 2016-10-29 (×7): 100 mg via ORAL
  Filled 2016-10-26 (×7): qty 1

## 2016-10-26 MED ORDER — DEXTROSE 5 % IV SOLN
500.0000 mg | Freq: Four times a day (QID) | INTRAVENOUS | Status: DC | PRN
  Filled 2016-10-26: qty 5

## 2016-10-26 MED ORDER — BUPIVACAINE HCL (PF) 0.5 % IJ SOLN
INTRAMUSCULAR | Status: DC | PRN
  Administered 2016-10-26: 3 mL

## 2016-10-26 MED ORDER — DIPHENHYDRAMINE HCL 12.5 MG/5ML PO ELIX
12.5000 mg | ORAL_SOLUTION | ORAL | Status: DC | PRN
  Administered 2016-10-26: 25 mg via ORAL
  Administered 2016-10-26 – 2016-10-27 (×2): 12.5 mg via ORAL
  Filled 2016-10-26: qty 5
  Filled 2016-10-26: qty 10
  Filled 2016-10-26: qty 5

## 2016-10-26 MED ORDER — NEOMYCIN-POLYMYXIN B GU 40-200000 IR SOLN
Status: DC | PRN
  Administered 2016-10-26: 16 mL

## 2016-10-26 MED ORDER — BISACODYL 10 MG RE SUPP: 10.0000 mg | Freq: Every day | RECTAL | Status: DC | PRN

## 2016-10-26 MED ORDER — FAMOTIDINE 20 MG PO TABS
20.0000 mg | ORAL_TABLET | Freq: Once | ORAL | Status: AC
  Administered 2016-10-26: 20 mg via ORAL

## 2016-10-26 MED ORDER — BUPIVACAINE LIPOSOME 1.3 % IJ SUSP
INTRAMUSCULAR | Status: AC
  Filled 2016-10-26: qty 20

## 2016-10-26 MED ORDER — METHOCARBAMOL 500 MG PO TABS
500.0000 mg | ORAL_TABLET | Freq: Four times a day (QID) | ORAL | Status: DC | PRN
  Administered 2016-10-26: 500 mg via ORAL
  Filled 2016-10-26: qty 1

## 2016-10-26 MED ORDER — BUPIVACAINE-EPINEPHRINE (PF) 0.25% -1:200000 IJ SOLN
INTRAMUSCULAR | Status: DC | PRN
  Administered 2016-10-26: 30 mL

## 2016-10-26 MED ORDER — CLINDAMYCIN PHOSPHATE 900 MG/50ML IV SOLN
900.0000 mg | Freq: Four times a day (QID) | INTRAVENOUS | Status: AC
  Administered 2016-10-26 – 2016-10-27 (×3): 900 mg via INTRAVENOUS
  Filled 2016-10-26 (×3): qty 50

## 2016-10-26 MED ORDER — FENTANYL CITRATE (PF) 100 MCG/2ML IJ SOLN: 25.0000 ug | INTRAMUSCULAR | Status: DC | PRN

## 2016-10-26 MED ORDER — NEOMYCIN-POLYMYXIN B GU 40-200000 IR SOLN
Status: AC
  Filled 2016-10-26: qty 20

## 2016-10-26 MED ORDER — CLINDAMYCIN PHOSPHATE 900 MG/50ML IV SOLN
INTRAVENOUS | Status: AC
  Filled 2016-10-26: qty 50

## 2016-10-26 SURGICAL SUPPLY — 59 items
BANDAGE ACE 6X5 VEL STRL LF (GAUZE/BANDAGES/DRESSINGS) ×3 IMPLANT
BLADE SAW 1 (BLADE) ×3 IMPLANT
BONE CEMENT GENTAMICIN (Cement) ×6 IMPLANT
CANISTER SUCT 1200ML W/VALVE (MISCELLANEOUS) ×3 IMPLANT
CANISTER SUCT 3000ML (MISCELLANEOUS) ×6 IMPLANT
CAPT KNEE TOTAL 3 ×3 IMPLANT
CATH FOL LEG HOLDER (MISCELLANEOUS) ×3 IMPLANT
CATH TRAY METER 16FR LF (MISCELLANEOUS) ×3 IMPLANT
CEMENT BONE GENTAMICIN 40 (Cement) ×2 IMPLANT
CHLORAPREP W/TINT 26ML (MISCELLANEOUS) ×6 IMPLANT
COOLER POLAR GLACIER W/PUMP (MISCELLANEOUS) ×3 IMPLANT
CUFF TOURN 30 STER DUAL PORT (MISCELLANEOUS) ×3 IMPLANT
DRAPE INCISE IOBAN 66X45 STRL (DRAPES) ×6 IMPLANT
DRAPE SHEET LG 3/4 BI-LAMINATE (DRAPES) ×6 IMPLANT
ELECT CAUTERY BLADE 6.4 (BLADE) ×3 IMPLANT
ELECT REM PT RETURN 9FT ADLT (ELECTROSURGICAL) ×3
ELECTRODE REM PT RTRN 9FT ADLT (ELECTROSURGICAL) ×1 IMPLANT
Femur Cutting Block ×3 IMPLANT
GAUZE PETRO XEROFOAM 1X8 (MISCELLANEOUS) ×3 IMPLANT
GAUZE SPONGE 4X4 12PLY STRL (GAUZE/BANDAGES/DRESSINGS) ×3 IMPLANT
GLOVE BIOGEL PI IND STRL 9 (GLOVE) ×2 IMPLANT
GLOVE BIOGEL PI INDICATOR 9 (GLOVE) ×4
GLOVE INDICATOR 8.0 STRL GRN (GLOVE) ×9 IMPLANT
GLOVE SURG ORTHO 8.0 STRL STRW (GLOVE) ×3 IMPLANT
GLOVE SURG SYN 9.0  PF PI (GLOVE) ×4
GLOVE SURG SYN 9.0 PF PI (GLOVE) ×2 IMPLANT
GOWN SRG 2XL LVL 4 RGLN SLV (GOWNS) ×1 IMPLANT
GOWN STRL NON-REIN 2XL LVL4 (GOWNS) ×2
GOWN STRL REUS W/ TWL LRG LVL3 (GOWN DISPOSABLE) ×1 IMPLANT
GOWN STRL REUS W/ TWL XL LVL3 (GOWN DISPOSABLE) ×1 IMPLANT
GOWN STRL REUS W/TWL LRG LVL3 (GOWN DISPOSABLE) ×2
GOWN STRL REUS W/TWL XL LVL3 (GOWN DISPOSABLE) ×2
HANDPIECE INTERPULSE COAX TIP (DISPOSABLE) ×2
HOOD PEEL AWAY FLYTE STAYCOOL (MISCELLANEOUS) ×6 IMPLANT
IMMBOLIZER KNEE 19 BLUE UNIV (SOFTGOODS) ×3 IMPLANT
KIT PREVENA INCISION MGT 13 (CANNISTER) ×3 IMPLANT
KIT RM TURNOVER STRD PROC AR (KITS) ×3 IMPLANT
KNEE MEDACTA TIBIAL/FEMORAL BL (Knees) ×3 IMPLANT
KNIFE SCULPS 14X20 (INSTRUMENTS) ×3 IMPLANT
NDL SAFETY 18GX1.5 (NEEDLE) ×3 IMPLANT
NEEDLE SPNL 18GX3.5 QUINCKE PK (NEEDLE) ×3 IMPLANT
NEEDLE SPNL 20GX3.5 QUINCKE YW (NEEDLE) ×3 IMPLANT
NS IRRIG 1000ML POUR BTL (IV SOLUTION) ×3 IMPLANT
PACK TOTAL KNEE (MISCELLANEOUS) ×3 IMPLANT
PAD WRAPON POLAR KNEE (MISCELLANEOUS) ×1 IMPLANT
SET HNDPC FAN SPRY TIP SCT (DISPOSABLE) ×1 IMPLANT
SOL .9 NS 3000ML IRR  AL (IV SOLUTION) ×2
SOL .9 NS 3000ML IRR UROMATIC (IV SOLUTION) ×1 IMPLANT
STAPLER SKIN PROX 35W (STAPLE) ×3 IMPLANT
SUCTION FRAZIER HANDLE 10FR (MISCELLANEOUS) ×2
SUCTION TUBE FRAZIER 10FR DISP (MISCELLANEOUS) ×1 IMPLANT
SUT DVC 2 QUILL PDO  T11 36X36 (SUTURE) ×2
SUT DVC 2 QUILL PDO T11 36X36 (SUTURE) ×1 IMPLANT
SUT DVC QUILL MONODERM 30X30 (SUTURE) ×3 IMPLANT
SYR 20CC LL (SYRINGE) ×3 IMPLANT
SYR 50ML LL SCALE MARK (SYRINGE) ×6 IMPLANT
TOWEL OR 17X26 4PK STRL BLUE (TOWEL DISPOSABLE) ×9 IMPLANT
TOWER CARTRIDGE SMART MIX (DISPOSABLE) ×3 IMPLANT
WRAPON POLAR PAD KNEE (MISCELLANEOUS) ×3

## 2016-10-26 NOTE — Progress Notes (Signed)
Dr. Rudene Christians notified via [place], patient is pcn allergic, Staph positive, still wants clindamycin same as previously Ordered.

## 2016-10-26 NOTE — Progress Notes (Signed)
Patient out of same day to or.

## 2016-10-26 NOTE — Progress Notes (Addendum)
Pt arrived from the OR at 1520 via her bed. Pt accompanied by fiance. Pt alert and oriented, eating candy, denied nausea, denied pain. Pt is on room air, lungs are clear bilat, HR is regular, abdomen is soft, bs heard. Foley draining clear yellow urine, PIV #20 intact to L hand, iv ns hung at 58mls/hr per md order. Pt's L knee has ace wrap intact, polar care intact, and foot pumps on bilat. l leg placed in bone foam with explanation to pt. Ppp. Pt initially unable to move her toes, but after approx 1/2 hr on the unit was able ot wiggle her toes and feel her legs waking up. Since arrival, pt has received snack and a meal, received robaxin and iv morphine for pain and discomfort. Cap refill to toes remains wnl. Pt is oriented to room and call bell.Pt has a provena wound vac intact to l knee as well. Pt began to itch after receiving morphine, was then given benadryl by this Probation officer. Pt stated she forgot that she cant take morphine, usually gets dilaudid.

## 2016-10-26 NOTE — H&P (Signed)
Reviewed paper H+P, will be scanned into chart. No changes noted.  

## 2016-10-26 NOTE — Progress Notes (Signed)
Patient has brace to her right knee, she she has a fracture in her right knee, states she was moving a piece of furniture And it started to fall on her.  States Dr. Rudene Christians is aware, Had right knee xrayed.

## 2016-10-26 NOTE — NC FL2 (Signed)
[place] LEVEL OF CARE SCREENING TOOL     IDENTIFICATION  Patient Name: Lisa Hawkins Birthdate: 12/05/1950 Sex: female Admission Date (Current Location): 10/26/2016  [facility] and [place] Number:  Selena Lesser  ([id number] [facility]) Facility and Address:  [facility], [address] [place], [place] [address]      Provider Number: [id number]  Attending Physician Name and Address:  Hessie Knows, MD  Relative Name and Phone Number:       Current Level of Care: Hospital Recommended Level of Care: [place] Prior Approval Number:    Date Approved/Denied:   PASRR Number:  ([id number])  Discharge Plan: SNF    Current Diagnoses: Patient Active Problem List   Diagnosis Date Noted  . Primary localized osteoarthritis of left knee 10/26/2016  . Primary localized osteoarthritis of right knee 07/15/2016   HIV infection (CMS-HCC)    Hypertension    HCV (hepatitis C virus       Orientation RESPIRATION BLADDER Height & Weight     Self, Time, Situation, Place  Normal External catheter Weight:   Height:     BEHAVIORAL SYMPTOMS/MOOD NEUROLOGICAL BOWEL NUTRITION STATUS   (None. )  (None.) Continent Diet (Diet: Regular)  AMBULATORY STATUS COMMUNICATION OF NEEDS Skin   Extensive Assist Verbally Surgical wounds (Incision: Left Knee)                       Personal Care Assistance Level of Assistance  Bathing, Feeding, Dressing Bathing Assistance: Limited assistance Feeding assistance: Independent Dressing Assistance: Limited assistance     Functional Limitations Info  Sight, Hearing, Speech Sight Info: Adequate Hearing Info: Adequate Speech Info: Adequate    SPECIAL CARE FACTORS FREQUENCY  PT (By licensed PT), OT (By licensed OT)     PT Frequency:  (5) OT Frequency:  (5)            Contractures      Additional Factors Info  Code Status, Allergies Code Status Info:  (Full Code) Allergies Info:   (Penicillins)           Current Medications (10/26/2016):  This is the current hospital active medication list Current Facility-Administered Medications  Medication Dose Route Frequency Provider Last Rate Last Dose  . 0.9 %  sodium chloride infusion   Intravenous Continuous Hessie Knows, MD 75 mL/hr at 10/26/16 1537    . abacavir-dolutegravir-lamiVUDine (TRIUMEQ) 600-50-300 MG per tablet 1 tablet  1 tablet Oral QHS Hessie Knows, MD      . acetaminophen (TYLENOL) tablet 650 mg  650 mg Oral Q6H PRN Hessie Knows, MD       Or  . acetaminophen (TYLENOL) suppository 650 mg  650 mg Rectal Q6H PRN Hessie Knows, MD      . amitriptyline (ELAVIL) tablet 25 mg  25 mg Oral TID Hessie Knows, MD      . amLODipine (NORVASC) tablet 10 mg  10 mg Oral QHS Hessie Knows, MD      . aspirin EC tablet 81 mg  81 mg Oral QHS Hessie Knows, MD      . bisacodyl (DULCOLAX) suppository 10 mg  10 mg Rectal Daily PRN Hessie Knows, MD      . citalopram (CELEXA) tablet 10 mg  10 mg Oral QHS Hessie Knows, MD      . clindamycin (CLEOCIN) IVPB 900 mg  900 mg Intravenous Q6H Hessie Knows, MD      . diphenhydrAMINE (BENADRYL) 12.5 MG/5ML elixir 12.5-25 mg  12.5-25  mg Oral Q4H PRN Hessie Knows, MD      . docusate sodium (COLACE) capsule 100 mg  100 mg Oral BID Hessie Knows, MD      . Derrill Memo ON 10/27/2016] enoxaparin (LOVENOX) injection 30 mg  30 mg Subcutaneous Q12H Hessie Knows, MD      . famotidine (PEPCID) 20 MG tablet           . gabapentin (NEURONTIN) capsule 900 mg  900 mg Oral BID Hessie Knows, MD      . hydrochlorothiazide (HYDRODIURIL) tablet 25 mg  25 mg Oral QHS Hessie Knows, MD      . magnesium citrate solution 1 Bottle  1 Bottle Oral Once PRN Hessie Knows, MD      . magnesium hydroxide (MILK OF MAGNESIA) suspension 30 mL  30 mL Oral Daily PRN Hessie Knows, MD      . menthol-cetylpyridinium (CEPACOL) lozenge 3 mg  1 lozenge Oral PRN Hessie Knows, MD       Or  . phenol (CHLORASEPTIC) mouth spray 1 spray  1 spray  Mouth/Throat PRN Hessie Knows, MD      . methocarbamol (ROBAXIN) tablet 500 mg  500 mg Oral Q6H PRN Hessie Knows, MD       Or  . methocarbamol (ROBAXIN) 500 mg in dextrose 5 % 50 mL IVPB  500 mg Intravenous Q6H PRN Hessie Knows, MD      . metoCLOPramide (REGLAN) tablet 5-10 mg  5-10 mg Oral Q8H PRN Hessie Knows, MD       Or  . metoCLOPramide (REGLAN) injection 5-10 mg  5-10 mg Intravenous Q8H PRN Hessie Knows, MD      . mirtazapine (REMERON) tablet 30 mg  30 mg Oral QHS Hessie Knows, MD      . morphine 4 MG/ML injection 4 mg  4 mg Intravenous Q2H PRN Hessie Knows, MD      . ondansetron [facility]) tablet 4 mg  4 mg Oral Q6H PRN Hessie Knows, MD       Or  . ondansetron [facility]) injection 4 mg  4 mg Intravenous Q6H PRN Hessie Knows, MD      . oxyCODONE (Oxy IR/ROXICODONE) immediate release tablet 5-10 mg  5-10 mg Oral Q3H PRN Hessie Knows, MD      . oxyCODONE (OXYCONTIN) 12 hr tablet 15 mg  15 mg Oral Q12H Hessie Knows, MD      . zolpidem [facility]) tablet 5 mg  5 mg Oral QHS PRN Hessie Knows, MD         Discharge Medications: Please see discharge summary for a list of discharge medications.  Relevant Imaging Results:  Relevant Lab Results:   Additional Information  (SSN: [id number])  Danie Chandler, Student-Social Work

## 2016-10-26 NOTE — Anesthesia Preprocedure Evaluation (Signed)
Anesthesia Evaluation  Patient identified by MRN, date of birth, ID band Patient awake    Reviewed: Allergy & Precautions, H&P , NPO status , Patient's Chart, lab work & pertinent test results  History of Anesthesia Complications (+) PONV and history of anesthetic complications  Airway Mallampati: III  TM Distance: >3 FB Neck ROM: limited    Dental no notable dental hx. (+) Poor Dentition, Missing, Chipped, Partial Upper   Pulmonary neg shortness of breath, Current Smoker,    Pulmonary exam normal breath sounds clear to auscultation       Cardiovascular Exercise Tolerance: Good hypertension, (-) angina(-) Past MI and (-) DOE Normal cardiovascular exam Rhythm:regular Rate:Normal     Neuro/Psych PSYCHIATRIC DISORDERS Anxiety  Neuromuscular disease    GI/Hepatic GERD  ,(+) Hepatitis -  Endo/Other  negative endocrine ROS  Renal/GU      Musculoskeletal  (+) Arthritis ,   Abdominal   Peds  Hematology negative hematology ROS (+)   Anesthesia Other Findings Past Medical History: No date: Anxiety No date: Arthritis No date: GERD (gastroesophageal reflux disease) No date: Hepatitis     Comment: Hep C - treated with Harvoni No date: HIV (human immunodeficiency virus infection) (* No date: Hypertension No date: Neuromuscular disorder (HCC)     Comment: toes No date: Neuropathy (HCC) No date: PONV (postoperative nausea and vomiting)  Past Surgical History: 1970: ABDOMINAL HYSTERECTOMY No date: BACK SURGERY     Comment: no metal No date: DIAGNOSTIC LAPAROSCOPY No date: FRACTURE SURGERY Left     Comment: wrist 2010: HERNIA REPAIR     Comment: umbilical No date: JOINT REPLACEMENT No date: TONSILLECTOMY 07/15/2016: TOTAL KNEE ARTHROPLASTY Right     Comment: Procedure: TOTAL KNEE ARTHROPLASTY;  Surgeon:               Hessie Knows, MD;  Location: [facility] ORS;                Service: Orthopedics;  Laterality:  Right;     Reproductive/Obstetrics negative OB ROS                             Anesthesia Physical Anesthesia Plan  ASA: III  Anesthesia Plan: Spinal   Post-op Pain Management:    Induction:   Airway Management Planned:   Additional Equipment:   Intra-op Plan:   Post-operative Plan:   Informed Consent: I have reviewed the patients History and Physical, chart, labs and discussed the procedure including the risks, benefits and alternatives for the proposed anesthesia with the patient or authorized representative who has indicated his/her understanding and acceptance.   Dental Advisory Given  Plan Discussed with: Anesthesiologist, CRNA and Surgeon  Anesthesia Plan Comments:         Anesthesia Quick Evaluation

## 2016-10-26 NOTE — Op Note (Signed)
10/26/2016  1:47 PM  PATIENT:  Lisa Hawkins  65 y.o. female  PRE-OPERATIVE DIAGNOSIS:  PRIMARY OSTEOARTHRITIS OF LEFT KNEE  POST-OPERATIVE DIAGNOSIS:  PRIMARY OSTEOARTHRITIS OF LEFT KNEE  PROCEDURE:  Procedure(s): TOTAL KNEE ARTHROPLASTY (Left)  SURGEON: Laurene Footman, MD  ASSISTANTS: None  ANESTHESIA:   spinal  EBL:  Total I/O In: 1000 [I.V.:1000] Out: 150 [Urine:100; Blood:50]  BLOOD ADMINISTERED:none  DRAINS: none   LOCAL MEDICATIONS USED:  MARCAINE    and OTHER Exparel  SPECIMEN:  No Specimen  DISPOSITION OF SPECIMEN:  N/A  COUNTS:  YES  TOURNIQUET:   61 minutes at 300 mmHg  IMPLANTS: Medacta GMK sphere total knee system 2+ femur left 2 tibia left with 10 mm insert and short stem, to patella all components cemented  DICTATION: .Dragon Dictation patient brought the operating room and after adequate anesthesia was obtained the left leg is prepped draped in sterile fashion tourniquet by the upper thigh. After patient education timeout procedures completed tourniquet was raised and a midline skin incision was made for by medial parapatellar arthrotomy with eburnated bone visible in the medial compartment moderate degenerative change to the lateral femoral condyle and extensive patellofemoral degenerative change with bone exposed. Anterior cruciate ligament and fat pad were excised and the proximal tibia and distal femur exposed to apply the [place] cutting blocks with proximal tibia cut carried out distal femoral cut carried out menisci were excised at this time along PCL and residual anterior cruciate ligament. Distal femoral 4-in-1 cutting guide was applied anterior posterior and chamfer cuts carried out. With 2 tibia pinned in position proximal tibia preparation was carried out proximal drilling and keel punch the femoral trial was placed and distal femoral drill holes made with a 10 mm insert there is good stability and full extension. The trochlear groove cut was made  with a router. The trials were removed and the patella cut using the patellar cutting guide and sized to size 2 after drilling holes were made this point the bony surfaces were thoroughly irrigated and dried and the above local and injected. The components were cemented into place first the tibial component with the polyethylene component following with set screw and torque limiting screwdriver followed by the femoral component the knee is held in extension of the patellar button clamped into place. After the cemented set excess cement was removed and the knee again thoroughly irrigated. Tourniquet was let down and hemostasis checked electrocautery. Arthrotomy was repaired using a heavy [place] with 3-0 v-loc subcutaneous closure and skin staples.Provena applied over incision  PLAN OF CARE: Admit to inpatient   PATIENT DISPOSITION:  PACU - hemodynamically stable.

## 2016-10-26 NOTE — Transfer of Care (Signed)
Immediate Anesthesia Transfer of Care Note  Patient: Lisa Hawkins  Procedure(s) Performed: Procedure(s): TOTAL KNEE ARTHROPLASTY (Left)  Patient Location: PACU  Anesthesia Type:Spinal  Level of Consciousness: sedated  Airway & Oxygen Therapy: Patient connected to face mask oxygen  Post-op Assessment: Post -op Vital signs reviewed and stable  Post vital signs: stable  Last Vitals:  Vitals:   10/26/16 0923 10/26/16 1354  BP: 138/75 117/66  Pulse: (!) 110 89  Resp: 20 13  Temp: 36.9 C 36.4 C    Last Pain:  Vitals:   10/26/16 1354  TempSrc: Temporal         Complications: No apparent anesthesia complications

## 2016-10-27 LAB — BASIC METABOLIC PANEL
Anion gap: 7 (ref 5–15)
BUN: 10 mg/dL (ref 6–20)
CO2: 24 mmol/L (ref 22–32)
Calcium: 8.4 mg/dL — ABNORMAL LOW (ref 8.9–10.3)
Chloride: 103 mmol/L (ref 101–111)
Creatinine, Ser: 0.8 mg/dL (ref 0.44–1.00)
GFR calc Af Amer: >60 mL/min (ref >60)
GLUCOSE: 158 mg/dL — AB (ref 65–99)
POTASSIUM: 4 mmol/L (ref 3.5–5.1)
Sodium: 134 mmol/L — ABNORMAL LOW (ref 135–145)

## 2016-10-27 LAB — CBC
HCT: 38.9 % (ref 35.0–47.0)
Hemoglobin: 13.3 g/dL (ref 12.0–16.0)
MCH: 32.2 pg (ref 26.0–34.0)
MCHC: 34.2 g/dL (ref 32.0–36.0)
MCV: 94.1 fL (ref 80.0–100.0)
PLATELETS: 251 10*3/uL (ref 150–440)
RBC: 4.13 MIL/uL (ref 3.80–5.20)
RDW: 16.4 % — ABNORMAL HIGH (ref 11.5–14.5)
WBC: 10.2 10*3/uL (ref 3.6–11.0)

## 2016-10-27 MED ORDER — INFLUENZA VAC SPLIT QUAD 0.5 ML IM SUSY
0.5000 mL | PREFILLED_SYRINGE | INTRAMUSCULAR | Status: AC
  Administered 2016-10-29: 0.5 mL via INTRAMUSCULAR
  Filled 2016-10-27: qty 0.5

## 2016-10-27 MED ORDER — LORAZEPAM 2 MG PO TABS
2.0000 mg | ORAL_TABLET | Freq: Four times a day (QID) | ORAL | Status: DC | PRN
  Administered 2016-10-27 (×2): 2 mg via ORAL
  Filled 2016-10-27 (×2): qty 1

## 2016-10-27 MED ORDER — LORAZEPAM 2 MG/ML IJ SOLN: 2.0000 mg | Freq: Four times a day (QID) | INTRAMUSCULAR | Status: DC | PRN

## 2016-10-27 MED ORDER — TRAMADOL HCL 50 MG PO TABS
50.0000 mg | ORAL_TABLET | Freq: Four times a day (QID) | ORAL | Status: DC | PRN
  Administered 2016-10-27: 100 mg via ORAL
  Filled 2016-10-27: qty 2

## 2016-10-27 MED ORDER — PNEUMOCOCCAL VAC POLYVALENT 25 MCG/0.5ML IJ INJ
0.5000 mL | INJECTION | INTRAMUSCULAR | Status: AC
  Administered 2016-10-29: 0.5 mL via INTRAMUSCULAR
  Filled 2016-10-27: qty 0.5

## 2016-10-27 NOTE — Progress Notes (Signed)
On assessment patient calm and stating she has a pain level in her left knee that is a 10 out of 10. Patient unable to recall recent events. Patient did say that she has a broken bone in her right leg as well. Patient had a RTK done in August. Patient still uses immobilizer on right knee. Patient heart sounds normal with elevated HR. Lung sounds clear. Patient has prevena on left knee with minimal drainage. Also has polar care applied.   Deri Fuelling, RN

## 2016-10-27 NOTE — Evaluation (Signed)
Physical Therapy Evaluation Patient Details Name: Lisa Hawkins MRN: [id number] DOB: 06-18-1951 Today's Date: 10/27/2016   History of Present Illness  Pt is a 65 y.o. female s/p L TKA 10/26/16.  Of note pt with R TKA 07/15/16.  Pt also reporting fx in R knee and needing to wear KI when OOB.  Discussed this with Dr. Rudene Christians in person AM of 10/27/16 and MD Rudene Christians reports pt with R knee inferior pole fx (about 2 weeks prior) and pt to be WBAT R LE with R KI donned and no R knee ROM.    Clinical Impression  Prior to hospital admission, pt was ambulating independently with R KI donned (d/t recent R knee fx).  Pt lives with her significant other in 1 level home with stairs to enter.  Pt unable to perform L LE SLR independently and d/t significant pain and weakness required L KI donned for OOB mobility.  Currently pt is requiring significant 2 person assist with bed mobility, transfers, and taking steps bed to recliner with RW.  Mobility complicated d/t need to use B KI and pt trying to sit while taking steps bed to recliner.  Pt very verbose and tangential during session and appearing confused and impulsive (pt reporting being 65 years old and talking about various things that pt reported happened during session but was not observed to take place by this therapist).  Pt would benefit from skilled PT to address noted impairments and functional limitations.  Recommend pt discharge to STR when medically appropriate.    Follow Up Recommendations SNF    Equipment Recommendations  Rolling walker with 5" wheels    Recommendations for Other Services       Precautions / Restrictions Precautions Precautions: Fall;Knee Precaution Booklet Issued: Yes (comment) Required Braces or Orthoses: Knee Immobilizer - Right;Knee Immobilizer - Left Knee Immobilizer - Right: On at all times Knee Immobilizer - Left: On when out of bed or walking;Discontinue once straight leg raise with < 10 degree lag Restrictions Weight  Bearing Restrictions: Yes RLE Weight Bearing: Weight bearing as tolerated LLE Weight Bearing: Weight bearing as tolerated Other Position/Activity Restrictions: R LE KI and WBAT; NO ROM R knee (per verbal discussion with MD Rudene Christians 10/27/16)      Mobility  Bed Mobility Overal bed mobility: Needs Assistance Bed Mobility: Supine to Sit     Supine to sit: Mod assist;+2 for physical assistance;HOB elevated     General bed mobility comments: assist for trunk and B LE's; limited d/t pain in B knees; vc's for UE use of side rail  Transfers Overall transfer level: Needs assistance Equipment used: Rolling walker (2 wheeled) Transfers: Sit to/from Stand Sit to Stand: Mod assist;Max assist;+2 physical assistance         General transfer comment: 1st trial pt unable to come to upright d/t c/o significant B knee pain; 2nd trial pt able to come to full stand with improved pain tolerance; vc's for UE placement and technique required with B KI's  Ambulation/Gait Ambulation/Gait assistance: Mod assist;Max assist;+2 physical assistance Ambulation Distance (Feet): 3 Feet (bed to chair) Assistive device: Rolling walker (2 wheeled)   Gait velocity: decreased   General Gait Details: vc's to increase UE support through RW; pt starting to sit halfway to chair requiring 2 assist to regain standing and finishing taking steps to chair; vc's for gait technique and walker use required as well as for safety  Stairs            Wheelchair  Mobility    Modified Rankin (Stroke Patients Only)       Balance Overall balance assessment: Needs assistance Sitting-balance support: Bilateral upper extremity supported;Feet supported Sitting balance-Leahy Scale: Fair     Standing balance support: Bilateral upper extremity supported (on RW) Standing balance-Leahy Scale: Fair Standing balance comment: static standing                             Pertinent Vitals/Pain Pain Assessment:  0-10 Pain Score: 10-Worst pain ever (10/10 B knees beginning of session; 5/10 B knees end of session) Pain Location: B knees Pain Descriptors / Indicators: Tender;Sore Pain Intervention(s): Limited activity within patient's tolerance;Monitored during session;Premedicated before session;Repositioned;Ice applied    Home Living Family/patient expects to be discharged to:: Private residence Living Arrangements: Spouse/significant other Available Help at Discharge:  (Significant other) Type of Home: House Home Access: Stairs to enter   [employer] of Steps: 6 no railing from front or 12 with B railing from back Home Layout: One level Home Equipment: Walker - 2 wheels      Prior Function Level of Independence: Independent               Hand Dominance        Extremity/Trunk Assessment   Upper Extremity Assessment: Generalized weakness;Defer to OT evaluation           Lower Extremity Assessment: RLE deficits/detail;LLE deficits/detail RLE Deficits / Details: Deferred R knee ROM d/t h/o recent R knee fx; good R DF/PF and quad set LLE Deficits / Details: good L DF/PF; fair quad set d/t pain; unable to perform L LE SLR independently; very limited (about 10 degrees) L knee flexion ROM d/t pain     Communication   Communication: No difficulties  Cognition Arousal/Alertness: Awake/alert Behavior During Therapy: Impulsive (Appearing confused; tangential; verbose) Overall Cognitive Status: No family/caregiver present to determine baseline cognitive functioning (Oriented to person, place, time, and situation)       Memory: Decreased recall of precautions              General Comments General comments (skin integrity, edema, etc.): Pt just received pain meds beginning of session.    Exercises Total Joint Exercises Ankle Circles/Pumps: AROM;Strengthening;Both;10 reps;Supine Quad Sets: AROM;Strengthening;Left;10 reps;Supine Heel Slides:  AAROM;Strengthening;Left;10 reps;Supine (minimal L knee flexion range (about 10 degrees); limited d/t pain) Hip ABduction/ADduction: AAROM;Strengthening;Left;10 reps;Supine Straight Leg Raises: AAROM;Strengthening;Left;10 reps;Supine Goniometric ROM: L knee extension ROM 12 degrees short of neutral semi-supine in bed; d/t significant pain unable to bend L knee for flexion ROM (will attempt again next PT session).  Vc's required for technique of ex's.   Assessment/Plan    PT Assessment Patient needs continued PT services  PT Problem List Decreased strength;Decreased range of motion;Decreased activity tolerance;Decreased balance;Decreased knowledge of use of DME;Decreased safety awareness;Decreased knowledge of precautions;Pain          PT Treatment Interventions DME instruction;Gait training;Stair training;Functional mobility training;Therapeutic activities;Therapeutic exercise;Balance training;Patient/family education    PT Goals (Current goals can be found in the Care Plan section)  Acute Rehab PT Goals Patient Stated Goal: to be able to walk PT Goal Formulation: With patient Time For Goal Achievement: 11/10/16 Potential to Achieve Goals: Good    Frequency BID   Barriers to discharge Decreased caregiver support      Co-evaluation               End of Session Equipment Utilized During Treatment: Gait belt;Right knee  immobilizer;Left knee immobilizer Activity Tolerance: Patient limited by pain Patient left: in chair;with call bell/phone within reach;with chair alarm set;with SCD's reapplied (B heels elevated via towel rolls; polar care in place and activated) Nurse Communication: Mobility status;Patient requests pain meds;Precautions;Weight bearing status         Time: 1005-1100 PT Time Calculation (min) (ACUTE ONLY): 55 min   Charges:   PT Evaluation $PT Eval Low Complexity: 1 Procedure PT Treatments $Therapeutic Exercise: 8-22 mins $Therapeutic Activity: 8-22  mins   PT G CodesLeitha Bleak 11/25/16, 12:31 PM Leitha Bleak, [place]

## 2016-10-27 NOTE — Progress Notes (Signed)
Subjective: 1 Day Post-Op Procedure(s) (LRB): TOTAL KNEE ARTHROPLASTY (Left) Patient reports pain as 10 on 0-10 scale.   Patient is well, but has had some minor complaints of pain Care Management to assist with discharge. Negative for chest pain and shortness of breath Fever: 99 this AM Gastrointestinal:Negative for nausea and vomiting  Objective: Vital signs in last 24 hours: Temp:  [97.6 F (36.4 C)-99.1 F (37.3 C)] 99 F (37.2 C) (11/29 0347) Pulse Rate:  [89-115] 115 (11/29 0347) Resp:  [13-21] 18 (11/29 0347) BP: (108-155)/(64-99) 155/89 (11/29 0347) SpO2:  [96 %-100 %] 96 % (11/29 0347) FiO2 (%):  [21 %] 21 % (11/28 1522)  Intake/Output from previous day:  Intake/Output Summary (Last 24 hours) at 10/27/16 0852 Last data filed at 10/27/16 0550  Gross per 24 hour  Intake             1890 ml  Output             1080 ml  Net              810 ml    Intake/Output this shift: No intake/output data recorded.  Labs:  Recent Labs  10/26/16 1525 10/27/16 0432  HGB 14.5 13.3    Recent Labs  10/26/16 1525 10/27/16 0432  WBC 6.4 10.2  RBC 4.48 4.13  HCT 42.8 38.9  PLT 268 251    Recent Labs  10/26/16 1525 10/27/16 0432  NA  --  134*  K  --  4.0  CL  --  103  CO2  --  24  BUN  --  10  CREATININE 0.99 0.80  GLUCOSE  --  158*  CALCIUM  --  8.4*   No results for input(s): LABPT, INR in the last 72 hours.   EXAM General - Patient is Alert, Appropriate and Disorganized Extremity - ABD soft Sensation intact distally Intact pulses distally Dorsiflexion/Plantar flexion intact Incision: Woundvac in place with moderate bloody drainage No cellulitis present Dressing/Incision - Woundvac in place with suction intact. Motor Function - intact, moving foot and toes well on exam.  Past Medical History:  Diagnosis Date  . Anxiety   . Arthritis   . GERD (gastroesophageal reflux disease)   . Hepatitis    Hep C - treated with Harvoni  . HIV (human  immunodeficiency virus infection) ([place])   . Hypertension   . Neuromuscular disorder (H[facility])    toes  . Neuropathy ([place])   . PONV (postoperative nausea and vomiting)    Assessment/Plan: 1 Day Post-Op Procedure(s) (LRB): TOTAL KNEE ARTHROPLASTY (Left) Active Problems:   Primary localized osteoarthritis of left knee  Estimated body mass index is 37.2 kg/m as calculated from the following:   Height as of 10/20/16: 5\' 3"  (1.6 m).   Weight as of 10/20/16: 95.3 kg (210 lb). Advance diet Up with therapy D/C IV fluids when tolerating a po diet.  Labs reviewed, Na 134, encouraged po intake. Pt complaining of pain in the left knee, 10 out of 10 this AM, will add tramadol to the patient's medications. Woundvac in place. Cbc and bmp ordered for tomorrow morning.  DVT Prophylaxis - Lovenox, Foot Pumps and TED hose Weight-Bearing as tolerated to left leg  J. Cameron Proud, PA-C [facility] 10/27/2016, 8:52 AM

## 2016-10-27 NOTE — Progress Notes (Signed)
OT Cancellation Note  Patient Details Name: Lisa Hawkins MRN: [id number] DOB: February 13, 1951   Cancelled Treatment:    Reason Eval/Treat Not Completed: Patient not medically ready.  Order received and chart reviewed.  Spoke to PT who was just completing session and indicated pt was confused and had recently received Ativan and having difficulty following instructions.  OT evaluation deferred until tomorrow.  Thank you for the referral.   Chrys Racer, OTR/L ascom [phone] 10/27/16, 2:28 PM

## 2016-10-27 NOTE — Anesthesia Postprocedure Evaluation (Signed)
Anesthesia Post Note  Patient: Lisa Hawkins  Procedure(s) Performed: Procedure(s) (LRB): TOTAL KNEE ARTHROPLASTY (Left)  Patient location during evaluation: Nursing Unit Anesthesia Type: Spinal Level of consciousness: awake, awake and alert and oriented Pain management: pain level controlled Vital Signs Assessment: post-procedure vital signs reviewed and stable Respiratory status: spontaneous breathing, nonlabored ventilation and respiratory function stable Cardiovascular status: stable Postop Assessment: no headache and no backache Anesthetic complications: no    Last Vitals:  Vitals:   10/26/16 2355 10/27/16 0347  BP: (!) 151/90 (!) 155/89  Pulse: (!) 113 (!) 115  Resp: 19 18  Temp: 36.8 C 37.2 C    Last Pain:  Vitals:   10/27/16 0443  TempSrc:   PainSc: 10-Worst pain ever                 Ricki Miller

## 2016-10-27 NOTE — Clinical Social Work Note (Signed)
Clinical Social Work Assessment  Patient Details  Name: Lisa Hawkins MRN: [id number] Date of Birth: 06-Jun-1951  Date of referral:  10/27/16               Reason for consult:  Facility Placement                Permission sought to share information with:  Chartered certified accountant granted to share information::  Yes, Verbal Permission Granted  Name::      [place]::   [place]   Relationship::     Contact Information:     Housing/Transportation Living arrangements for the past 2 months:  [place] of Information:  Patient, Friend/Neighbor Patient Interpreter Needed:  None Criminal Activity/Legal Involvement Pertinent to Current Situation/Hospitalization:  No - Comment as needed Significant Relationships:  Significant Other Lives with:  Significant Other Do you feel safe going back to the place where you live?  Yes Need for family participation in patient care:  Yes (Comment)  Care giving concerns:  Patient lives in [place] with her fiance Lisa Hawkins.    Social Worker assessment / plan:  Holiday representative (CSW) received SNF consult. PT is recommending SNF. CSW met with patient alone at bedside to discuss D/C plan. Patient was oriented to self and place. CSW introduced self and explained role of CSW department. Patient reported that she remembered CSW coming to her house. CSW explained that patient may remember CSW from [facility] [employer]. Per patient she lives in [place] with her fiance Lisa Hawkins. CSW explained that PT is recommending SNF. Patient at first reported that she did not want to go to rehab however she did change her mind and say she was agreeable to SNF search. CSW contacted patient's significant other Lisa Hawkins. Per Lisa Hawkins patient has been to [facility] before and that is a preference.   FL2 complete and faxed out. CSW will continue to follow and assist as needed.    Employment status:  Retired Programmer, applications PT Recommendations:  [place] / Referral to community resources:  [place]  Patient/Family's Response to care:  Patient did agree to [employer].   Patient/Family's Understanding of and Emotional Response to Diagnosis, Current Treatment, and Prognosis:  Patient was pleasant and thanked CSW for visit.   Emotional Assessment Appearance:  Appears stated age Attitude/Demeanor/Rapport:  Lethargic Affect (typically observed):  Pleasant Orientation:  Oriented to Self, Oriented to Place, Fluctuating Orientation (Suspected and/or reported Sundowners) Alcohol / Substance use:  Not Applicable Psych involvement (Current and /or in the community):  No (Comment)  Discharge Needs  Concerns to be addressed:  Discharge Planning Concerns Readmission within the last 30 days:  No Current discharge risk:  Dependent with Mobility Barriers to Discharge:  Continued Medical Work up   [employer], Veronia Beets, LCSW 10/27/2016, 2:07 PM

## 2016-10-27 NOTE — Progress Notes (Signed)
Per [facility] liaison they can accept patient at [facility] if she brings a 30 day supply of her HIV Medication. Clinical Education officer, museum (CSW) attempted to contact patient's fiance [name] however he did not answer. CSW attempted to meet with patient and ask her to bring the medication however she was asleep. CSW will follow up with patient tomorrow.   [employer], LCSW [phone]

## 2016-10-27 NOTE — Clinical Social Work Placement (Signed)
   CLINICAL SOCIAL WORK PLACEMENT  NOTE  Date:  10/27/2016  Patient Details  Name: Lisa Hawkins MRN: [id number] Date of Birth: Apr 07, 1951  Clinical Social Work is seeking post-discharge placement for this patient at the [place] level of care (*CSW will initial, date and re-position this form in  chart as items are completed):  Yes   Patient/family provided with [place] Work Department's list of facilities offering this level of care within the geographic area requested by the patient (or if unable, by the patient's family).  Yes   Patient/family informed of their freedom to choose among providers that offer the needed level of care, that participate in Medicare, Medicaid or managed care program needed by the patient, have an available bed and are willing to accept the patient.  Yes   Patient/family informed of [facility]'s ownership interest in [facility] and [facility], as well as of the fact that they are under no obligation to receive care at these facilities.  PASRR submitted to EDS on       PASRR number received on       Existing PASRR number confirmed on 10/27/16     FL2 transmitted to all facilities in geographic area requested by pt/family on 10/27/16     FL2 transmitted to all facilities within larger geographic area on       Patient informed that his/her managed care company has contracts with or will negotiate with certain facilities, including the following:            Patient/family informed of bed offers received.  Patient chooses bed at       Physician recommends and patient chooses bed at      Patient to be transferred to   on  .  Patient to be transferred to facility by       Patient family notified on   of transfer.  Name of family member notified:        PHYSICIAN       Additional Comment:    _______________________________________________ Nastassja Witkop, Veronia Beets, LCSW 10/27/2016, 2:05 PM

## 2016-10-27 NOTE — Care Management (Signed)
RNCM consult for discharge planning. PT consult pending. It is anticipated patient will need STR. Will follow with CSW and assist as needed.

## 2016-10-27 NOTE — Progress Notes (Signed)
Physical Therapy Treatment Patient Details Name: Lisa Hawkins MRN: [id number] DOB: 1951-08-21 Today's Date: 10/27/2016    History of Present Illness Pt is a 65 y.o. female s/p L TKA 10/26/16.  Of note pt with R TKA 07/15/16.  Pt also reporting fx in R knee and needing to wear KI when OOB.  Discussed this with Dr. Rudene Christians in person AM of 10/27/16 and MD Rudene Christians reports pt with R knee inferior pole patellar fx (about 2 weeks prior) and pt to be WBAT R LE with R KI donned and no R knee ROM.      PT Comments    Nursing reporting pt with recent Ativan and also with increased confusion and moved to room closer to nursing station.  Attempted to measure L knee flexion ROM (pt verbally agreeable) but with multiple attempts pt refusing and resisting attempts (maximum L knee flexion 15 degrees achieved).  Pt appearing with increased confusion since this morning session and other times lethargic in bed (anticipate lethargy medication related).  Unsafe to attempt functional mobility at this time d/t confusion and recent Ativan; session limited d/t pt's ability to participate in therapy activities.  Will continue to progress pt with functional mobility, L knee ROM, and strengthening per pt tolerance.   Follow Up Recommendations  SNF     Equipment Recommendations  Rolling walker with 5" wheels    Recommendations for Other Services       Precautions / Restrictions Precautions Precautions: Fall;Knee Precaution Booklet Issued: Yes (comment) Required Braces or Orthoses: Knee Immobilizer - Right;Knee Immobilizer - Left Knee Immobilizer - Right: On at all times Knee Immobilizer - Left: On when out of bed or walking;Discontinue once straight leg raise with < 10 degree lag Restrictions Weight Bearing Restrictions: Yes RLE Weight Bearing: Weight bearing as tolerated LLE Weight Bearing: Weight bearing as tolerated Other Position/Activity Restrictions: R LE KI and WBAT; NO ROM R knee (per verbal discussion  with MD Rudene Christians 10/27/16)    Mobility  Bed Mobility          General bed mobility comments: Deferred OOB mobility d/t safety concerns (pt with increased confusion and with recent Ativan)  Transfers          Ambulation/Gait       Stairs            Wheelchair Mobility    Modified Rankin (Stroke Patients Only)       Balance                         Cognition Arousal/Alertness: Lethargic Behavior During Therapy: Impulsive Overall Cognitive Status:  (Oriented to self and place but not time or situation)       Memory: Decreased recall of precautions              Exercises Total Joint Exercises Goniometric ROM: L knee flexion at least 15 degrees semi supine in bed (multiple attempts to get increased ROM but pt resisting attempts (although pt verbalizing agreement to trying to bend L knee)    General Comments General comments (skin integrity, edema, etc.): Pt just received pain meds beginning of session.  Nursing cleared pt for participation in physical therapy.  Pt agreeable to very limited PT session.     Pertinent Vitals/Pain Pain Assessment: 0-10 Pain Score: 10-Worst pain ever (10/10 with L knee movement; 4/10 at rest) Pain Location: L knee > R knee Pain Descriptors / Indicators: Sore;Tender Pain Intervention(s): Limited activity within patient's  tolerance;Monitored during session;Premedicated before session;Repositioned;Ice applied    Home Living Family/patient expects to be discharged to:: Private residence Living Arrangements: Spouse/significant other Available Help at Discharge:  (Significant other) Type of Home: House Home Access: Stairs to enter   Home Layout: One level Home Equipment: Environmental consultant - 2 wheels      Prior Function Level of Independence: Independent          PT Goals (current goals can now be found in the care plan section) Acute Rehab PT Goals Patient Stated Goal: to be able to walk PT Goal Formulation: With  patient Time For Goal Achievement: 11/10/16 Potential to Achieve Goals: Fair Progress towards PT goals: Not progressing toward goals - comment (Limited d/t increased confusion and pain)    Frequency    BID      PT Plan Current plan remains appropriate    Co-evaluation             End of Session Equipment Utilized During Treatment: Right knee immobilizer;Left knee immobilizer Activity Tolerance: Patient limited by pain;Patient limited by lethargy Patient left: in bed;with call bell/phone within reach;with bed alarm set;with SCD's reapplied (L heel in bone foam; R heel elevated via towel roll; polar care in place and activated)     Time: [id number] PT Time Calculation (min) (ACUTE ONLY): 15 min  Charges:  $Therapeutic Exercise: 8-22 mins                    G CodesLeitha Bleak 11/15/2016, 2:28 PM Leitha Bleak, [place]

## 2016-10-27 NOTE — Progress Notes (Signed)
The Nurse in the 1A unit asked the [facility] to visit the Pt in [place]. [place] [facility] visited the Pt, but the Pt was asleep. [facility] plans to have another visit at an appropriate time. CH offered silent prayer, then left.    10/27/16 1600  Clinical Encounter Type  Visited With Patient  Visit Type Initial  Referral From Nurse  Consult/Referral To Chaplain  Spiritual Encounters  Spiritual Needs Prayer;Other (Comment)

## 2016-10-27 NOTE — Progress Notes (Signed)
Patient is disoriented to situation. Complaining of pain 10 out of 10. Patient was a 4 assist from chair to bed. Patient unable to recall events that happened this morning and yesterday. RN asked patient does she drink alcohol and patient said yes. Patient HR 125. PA [place] made aware. Ordered Urine specimen.   Deri Fuelling, RN

## 2016-10-28 ENCOUNTER — Inpatient Hospital Stay: Payer: Commercial Managed Care - HMO

## 2016-10-28 ENCOUNTER — Encounter: Payer: Self-pay | Admitting: *Deleted

## 2016-10-28 DIAGNOSIS — R4182 Altered mental status, unspecified: Secondary | ICD-10-CM

## 2016-10-28 DIAGNOSIS — R0600 Dyspnea, unspecified: Secondary | ICD-10-CM

## 2016-10-28 DIAGNOSIS — E871 Hypo-osmolality and hyponatremia: Secondary | ICD-10-CM

## 2016-10-28 DIAGNOSIS — R160 Hepatomegaly, not elsewhere classified: Secondary | ICD-10-CM

## 2016-10-28 DIAGNOSIS — R062 Wheezing: Secondary | ICD-10-CM

## 2016-10-28 DIAGNOSIS — J9601 Acute respiratory failure with hypoxia: Secondary | ICD-10-CM

## 2016-10-28 LAB — URINE CULTURE: Culture: 30000 — AB

## 2016-10-28 LAB — CBC
HEMATOCRIT: 38.1 % (ref 35.0–47.0)
HEMATOCRIT: 39.8 % (ref 35.0–47.0)
HEMOGLOBIN: 13 g/dL (ref 12.0–16.0)
HEMOGLOBIN: 13.6 g/dL (ref 12.0–16.0)
MCH: 31.8 pg (ref 26.0–34.0)
MCH: 32.1 pg (ref 26.0–34.0)
MCHC: 34 g/dL (ref 32.0–36.0)
MCHC: 34.1 g/dL (ref 32.0–36.0)
MCV: 93.6 fL (ref 80.0–100.0)
MCV: 94.2 fL (ref 80.0–100.0)
Platelets: 224 10*3/uL (ref 150–440)
Platelets: 256 10*3/uL (ref 150–440)
RBC: 4.08 MIL/uL (ref 3.80–5.20)
RBC: 4.23 MIL/uL (ref 3.80–5.20)
RDW: 15.9 % — AB (ref 11.5–14.5)
RDW: 15.8 % — ABNORMAL HIGH (ref 11.5–14.5)
WBC: 16.9 10*3/uL — AB (ref 3.6–11.0)
WBC: 16.7 10*3/uL — ABNORMAL HIGH (ref 3.6–11.0)

## 2016-10-28 LAB — URINALYSIS COMPLETE WITH MICROSCOPIC (ARMC ONLY)
BILIRUBIN URINE: NEGATIVE
Glucose, UA: NEGATIVE mg/dL
KETONES UR: NEGATIVE mg/dL
Nitrite: NEGATIVE
PH: 6 (ref 5.0–8.0)
PROTEIN: NEGATIVE mg/dL
Specific Gravity, Urine: 1.015 (ref 1.005–1.030)

## 2016-10-28 LAB — BASIC METABOLIC PANEL
ANION GAP: 8 (ref 5–15)
Anion gap: 8 (ref 5–15)
BUN: 6 mg/dL (ref 6–20)
BUN: 7 mg/dL (ref 6–20)
CALCIUM: 8.9 mg/dL (ref 8.9–10.3)
CHLORIDE: 95 mmol/L — AB (ref 101–111)
CHLORIDE: 92 mmol/L — AB (ref 101–111)
CO2: 26 mmol/L (ref 22–32)
CO2: 29 mmol/L (ref 22–32)
CREATININE: 0.77 mg/dL (ref 0.44–1.00)
Calcium: 9.2 mg/dL (ref 8.9–10.3)
Creatinine, Ser: 0.73 mg/dL (ref 0.44–1.00)
GFR calc Af Amer: >60 mL/min (ref >60)
GFR calc non Af Amer: >60 mL/min (ref >60)
GFR calc non Af Amer: >60 mL/min (ref >60)
GLUCOSE: 135 mg/dL — AB (ref 65–99)
GLUCOSE: 125 mg/dL — AB (ref 65–99)
POTASSIUM: 3.3 mmol/L — AB (ref 3.5–5.1)
POTASSIUM: 3.5 mmol/L (ref 3.5–5.1)
SODIUM: 129 mmol/L — AB (ref 135–145)
Sodium: 129 mmol/L — ABNORMAL LOW (ref 135–145)

## 2016-10-28 LAB — BLOOD GAS, ARTERIAL
ACID-BASE EXCESS: 5.6 mmol/L — AB (ref 0.0–2.0)
Bicarbonate: 29.8 mmol/L — ABNORMAL HIGH (ref 20.0–28.0)
FIO2: 0.21
O2 SAT: 88.7 %
PCO2 ART: 41 mmHg (ref 32.0–48.0)
PH ART: 7.47 — AB (ref 7.350–7.450)
PO2 ART: 52 mmHg — AB (ref 83.0–108.0)
Patient temperature: 37

## 2016-10-28 LAB — AMMONIA: AMMONIA: 36 umol/L — AB (ref 9–35)

## 2016-10-28 LAB — MRSA PCR SCREENING: MRSA BY PCR: NEGATIVE

## 2016-10-28 MED ORDER — NALOXONE HCL 0.4 MG/ML IJ SOLN
INTRAMUSCULAR | Status: AC
  Administered 2016-10-28: 0.4 mg
  Filled 2016-10-28: qty 1

## 2016-10-28 MED ORDER — LACTULOSE ENEMA
300.0000 mL | Freq: Two times a day (BID) | ORAL | Status: DC
  Administered 2016-10-28: 300 mL via RECTAL
  Filled 2016-10-28 (×3): qty 300

## 2016-10-28 MED ORDER — BUDESONIDE 0.25 MG/2ML IN SUSP
0.2500 mg | Freq: Two times a day (BID) | RESPIRATORY_TRACT | Status: DC
  Administered 2016-10-28 (×2): 0.25 mg via RESPIRATORY_TRACT
  Filled 2016-10-28 (×3): qty 2

## 2016-10-28 MED ORDER — POTASSIUM CHLORIDE IN NACL 20-0.9 MEQ/L-% IV SOLN
INTRAVENOUS | Status: DC
  Administered 2016-10-28: 23:00:00 via INTRAVENOUS
  Filled 2016-10-28 (×3): qty 1000

## 2016-10-28 MED ORDER — LEVALBUTEROL HCL 1.25 MG/0.5ML IN NEBU
1.2500 mg | INHALATION_SOLUTION | Freq: Four times a day (QID) | RESPIRATORY_TRACT | Status: DC
  Administered 2016-10-28: 1.25 mg via RESPIRATORY_TRACT
  Filled 2016-10-28: qty 0.5

## 2016-10-28 MED ORDER — METHYLPREDNISOLONE SODIUM SUCC 125 MG IJ SOLR
60.0000 mg | INTRAMUSCULAR | Status: DC
  Administered 2016-10-28: 60 mg via INTRAVENOUS
  Filled 2016-10-28: qty 2

## 2016-10-28 MED ORDER — OXYCODONE HCL 5 MG PO TABS: 5.0000 mg | ORAL_TABLET | Freq: Four times a day (QID) | ORAL | 0 refills | Status: DC | PRN

## 2016-10-28 MED ORDER — ENOXAPARIN SODIUM 40 MG/0.4ML ~~LOC~~ SOLN: 40.0000 mg | SUBCUTANEOUS | 0 refills | Status: DC

## 2016-10-28 MED ORDER — TAPENTADOL HCL 100 MG PO TABS: 100.0000 mg | ORAL_TABLET | ORAL | 0 refills | Status: DC | PRN

## 2016-10-28 MED ORDER — LEVOFLOXACIN IN D5W 750 MG/150ML IV SOLN
750.0000 mg | INTRAVENOUS | Status: DC
  Administered 2016-10-28: 750 mg via INTRAVENOUS
  Filled 2016-10-28 (×2): qty 150

## 2016-10-28 MED ORDER — LEVALBUTEROL HCL 1.25 MG/0.5ML IN NEBU
1.2500 mg | INHALATION_SOLUTION | Freq: Four times a day (QID) | RESPIRATORY_TRACT | Status: DC
  Administered 2016-10-28 – 2016-10-29 (×2): 1.25 mg via RESPIRATORY_TRACT
  Filled 2016-10-28 (×3): qty 0.5

## 2016-10-28 MED ORDER — OXYCODONE HCL ER 15 MG PO T12A: 15.0000 mg | EXTENDED_RELEASE_TABLET | Freq: Two times a day (BID) | ORAL | 0 refills | Status: DC

## 2016-10-28 MED ORDER — NALOXONE HCL 0.4 MG/ML IJ SOLN
0.4000 mg | INTRAMUSCULAR | Status: DC | PRN
  Administered 2016-10-28: 0.4 mg via INTRAVENOUS
  Filled 2016-10-28: qty 1

## 2016-10-28 NOTE — Progress Notes (Signed)
OT Cancellation Note  Patient Details Name: Lisa Hawkins MRN: [id number] DOB: 07-30-1951   Cancelled Treatment:    Reason Eval/Treat Not Completed: Patient's level of consciousness Attempted therapy but pt not arousing to voice or tactile stimulation.  Will attempt evaluation again later today or tomorrow.  Chrys Racer, OTR/L ascom [phone] 10/28/16, 12:17 PM

## 2016-10-28 NOTE — Plan of Care (Addendum)
Rapid response called d/t SN finding patient unresponsive to voice or touch.  Even with pain and ice, patient had slight flinch, but did not respond to commands or open eyes.  This was change in mental status from 0730 assessment and morning meds administration.  Vitals taken and were stable, Dr. Curly Rim, orders given and [facility] consult obtained due to change.  Hosp ordered labs, ammonia, narcan x2 as needed, CT head, tele, nebs and solumedrol.  Will continue to assess patient situation and needs.

## 2016-10-28 NOTE — Progress Notes (Signed)
PT Cancellation Note  Patient Details Name: SHANERIA SPURBECK MRN: [id number] DOB: 09-07-1951   Cancelled Treatment:    Reason Eval/Treat Not Completed: Fatigue/lethargy limiting ability to participate.  Pt continues to be sedated/non responsive.  Will continue to hold PT until pt more alert.  Jawan Chavarria A Leianne Callins, PT 10/28/2016, 2:26 PM

## 2016-10-28 NOTE — Progress Notes (Signed)
Pharmacy Antibiotic Note  Lisa Hawkins is a 65 y.o. female admitted on 10/26/2016 with pneumonia/HCAP.  Pharmacy has been consulted for Levofloxacin dosing.  Plan: Start patient on levofloxacin 750mg  IV daily. Dr. Ether Griffins request IV. Monitor patient for clinical improvement and switch from IV to PO when possible.   Height: 5\' 3"  (160 cm) Weight: 211 lb 10.3 oz (96 kg) IBW/kg (Calculated) : 52.4  Temp (24hrs), Avg:99.3 F (37.4 C), Min:98.2 F (36.8 C), Max:100.8 F (38.2 C)   Recent Labs Lab 10/26/16 1525 10/27/16 0432 10/28/16 0326 10/28/16 1214  WBC 6.4 10.2 16.9* 16.7*  CREATININE 0.99 0.80 0.73 0.77    Estimated Creatinine Clearance: 78.3 mL/min (by C-G formula based on SCr of 0.77 mg/dL).    Allergies  Allergen Reactions  . Penicillins Swelling and Rash    Has patient had a PCN reaction causing immediate rash, facial/tongue/throat swelling, SOB or lightheadedness with hypotension: Yes Has patient had a PCN reaction causing severe rash involving mucus membranes or skin necrosis: No Has patient had a PCN reaction that required hospitalization Yes Has patient had a PCN reaction occurring within the last 10 years: No If all of the above answers are "NO", then may proceed with Cephalosporin use.     Antimicrobials this admission: 11/30 levofloxacin >>  Dose adjustments this admission:  Microbiology results:   Thank you for allowing pharmacy to be a part of this patient's care.  Pernell Dupre, PharmD, BCPS Clinical Pharmacist 10/28/2016 2:08 PM

## 2016-10-28 NOTE — Consult Note (Addendum)
[facility] NAME: Lisa Hawkins    MR#:  [id number]  DATE OF BIRTH:  [birth year]  DATE OF ADMISSION:  10/26/2016  PRIMARY CARE PHYSICIAN: PROVIDER NOT IN SYSTEM   REQUESTING/REFERRING PHYSICIAN: DR  Rudene Christians  CHIEF COMPLAINT:  No chief complaint on file.   HISTORY OF PRESENT ILLNESS:  Lisa Hawkins  is a 65 y.o. female with a known history of Hepatitis C, HIV, hypertension, neuropathy, anxiety, arthritis, gastroesophageal reflux disease, status post recent left knee arthroplasty on 10/18/2016 by Dr. Rudene Christians , on multiple opiates perioperatively, who was noted to be unresponsive at around 11:00 today. Patient was able to eat some applesauce earlier in the morning, but became unresponsive later in the day after 15 mg of MS Contin administration orally, no IV appears were given, per nursing staff. Patient is very somnolent, but able to respond to pain, not able to follow commands, nonverbal. Not able to get review of systems due to altered mental status. Hospitalist services were requested for consultation.   PAST MEDICAL HISTORY:   Past Medical History:  Diagnosis Date  . Anxiety   . Arthritis   . GERD (gastroesophageal reflux disease)   . Hepatitis    Hep C - treated with Harvoni  . HIV (human immunodeficiency virus infection) ([place])   . Hypertension   . Neuromuscular disorder (HCC)    toes  . Neuropathy ([place])   . PONV (postoperative nausea and vomiting)     PAST SURGICAL HISTOIRY:   Past Surgical History:  Procedure Laterality Date  . ABDOMINAL HYSTERECTOMY  1970  . BACK SURGERY     no metal  . DIAGNOSTIC LAPAROSCOPY    . FRACTURE SURGERY Left    wrist  . HERNIA REPAIR  [id number]   umbilical  . JOINT REPLACEMENT    . TONSILLECTOMY    . TOTAL KNEE ARTHROPLASTY Right 07/15/2016   Procedure: TOTAL KNEE ARTHROPLASTY;  Surgeon: Hessie Knows, MD;  Location: [facility] ORS;  Service: Orthopedics;  Laterality: Right;  . TOTAL KNEE  ARTHROPLASTY Left 10/26/2016   Procedure: TOTAL KNEE ARTHROPLASTY;  Surgeon: Hessie Knows, MD;  Location: [facility] ORS;  Service: Orthopedics;  Laterality: Left;    SOCIAL HISTORY:   Social History  Substance Use Topics  . Smoking status: Current Every Day Smoker    Packs/day: 0.50    Years: 45.00    Types: Cigarettes  . Smokeless tobacco: Never Used  . Alcohol use No    FAMILY HISTORY:   Family History  Problem Relation Age of Onset  . Hypertension Father     DRUG ALLERGIES:   Allergies  Allergen Reactions  . Penicillins Swelling and Rash    Has patient had a PCN reaction causing immediate rash, facial/tongue/throat swelling, SOB or lightheadedness with hypotension: Yes Has patient had a PCN reaction causing severe rash involving mucus membranes or skin necrosis: No Has patient had a PCN reaction that required hospitalization Yes Has patient had a PCN reaction occurring within the last 10 years: No If all of the above answers are "NO", then may proceed with Cephalosporin use.     REVIEW OF SYSTEMS:  Unavailable due to altered mental status   MEDICATIONS AT HOME:   Prior to Admission medications   Medication Sig Start Date End Date Taking? Authorizing Provider  amitriptyline (ELAVIL) 25 MG tablet Take 25 mg by mouth 3 (three) times daily. 05/25/16  Yes Historical Provider, MD  amLODipine (NORVASC) 10 MG tablet Take  10 mg by mouth at bedtime. 05/25/16  Yes Historical Provider, MD  aspirin EC 81 MG tablet Take 81 mg by mouth at bedtime.   Yes Historical Provider, MD  citalopram (CELEXA) 10 MG tablet Take 10 mg by mouth at bedtime. 04/02/16  Yes Historical Provider, MD  gabapentin (NEURONTIN) 300 MG capsule Take 900 mg by mouth 2 (two) times daily. 03/29/16  Yes Historical Provider, MD  hydrochlorothiazide (HYDRODIURIL) 25 MG tablet Take 25 mg by mouth at bedtime. 05/25/16  Yes Historical Provider, MD  mirtazapine (REMERON) 30 MG tablet Take 30 mg by mouth at bedtime. 03/30/16  Yes  Historical Provider, MD  TRIUMEQ 600-50-300 MG tablet Take 1 tablet by mouth at bedtime. 04/28/16  Yes Historical Provider, MD  enoxaparin (LOVENOX) 40 MG/0.4ML injection Inject 0.4 mLs (40 mg total) into the skin daily. 10/28/16 11/12/16  Reche Dixon, PA-C  oxyCODONE (OXY IR/ROXICODONE) 5 MG immediate release tablet Take 1 tablet (5 mg total) by mouth every 6 (six) hours as needed. 10/28/16   Reche Dixon, PA-C  oxyCODONE (OXYCONTIN) 15 mg 12 hr tablet Take 1 tablet (15 mg total) by mouth every 12 (twelve) hours. 10/28/16   Reche Dixon, PA-C  Tapentadol HCl 100 MG TABS Take 1 tablet (100 mg total) by mouth every 4 (four) hours as needed. 10/28/16   Reche Dixon, PA-C      VITAL SIGNS:  Blood pressure (!) 151/85, pulse (!) 118, temperature 98.8 F (37.1 C), resp. rate 18, height 5\' 3"  (1.6 m), weight 96 kg (211 lb 10.3 oz), SpO2 93 %.  PHYSICAL EXAMINATION:  GENERAL:  65 y.o.-year-old patient lying in the bed In moderate respiratory distress, uncomfortable, mumbles, grimaces to pain, tachypneic, wheezing from the distance.  EYES: Pupils equal, 2 mm, round, reactive to light and accommodation. No scleral icterus. Extraocular muscles intact.  HEENT: Head atraumatic, normocephalic. Oropharynx and nasopharynx clear, dry oral mucosa.  NECK:  Supple, no jugular venous distention. No thyroid enlargement, no tenderness.  LUNGS: Markedly diminished breath sounds bilaterally, scattered diffuse inspiratory and expiratory wheezing, scattered rales,rhonchi , but no crepitations, intermittent cough, some rattling in the chest. Using accessory muscles of respiration, tachypneic, uncomfortable.  CARDIOVASCULAR: S1, S2 , tachycardic, intermittently irregular, distant, difficult to appreciate due to  sounds from the lungs. No murmurs, rubs, or gallops.  ABDOMEN: Soft, nontender, nondistended. Bowel sounds present. Significant hepatomegaly of approximately 7 cm below the costal ridge, no masses.  EXTREMITIES: No pedal  edema, cyanosis, or clubbing.  NEUROLOGIC: Cranial nerves II through XII are grossly intact. Muscle strength unable to evaluate. Sensation unable to evaluate. Gait not checked.  PSYCHIATRIC: The patient is somnolent, unable to assess orientation, she moans and mumbles to the pain , does not follow commands, is nonverbal, she was oriented to herself earlier, per nursing staff SKIN: No obvious rash, lesion, or ulcer.   LABORATORY PANEL:   CBC  Recent Labs Lab 10/28/16 1214  WBC 16.7*  HGB 13.6  HCT 39.8  PLT 256   ------------------------------------------------------------------------------------------------------------------  Chemistries   Recent Labs Lab 10/28/16 0326  NA 129*  K 3.3*  CL 95*  CO2 26  GLUCOSE 135*  BUN 6  CREATININE 0.73  CALCIUM 8.9   ------------------------------------------------------------------------------------------------------------------  Cardiac Enzymes No results for input(s): TROPONINI in the last 168 hours. ------------------------------------------------------------------------------------------------------------------  RADIOLOGY:  Dg Knee 1-2 Views Left  Result Date: 10/26/2016 CLINICAL DATA:  Status post total knee replacement EXAM: LEFT KNEE - 1-2 VIEW COMPARISON:  Left knee CT August 30, 2016 FINDINGS:  Frontal and lateral views were obtained. The patient is status post total knee replacement with femoral and tibial prosthetic components well-seated. No acute fracture or dislocation. Air within the joint is an expected postoperative finding. There is a drain with its tip anterior to the patella. IMPRESSION: Prosthetic components appear well seated. No acute fracture or dislocation. Electronically Signed   By: Lowella Grip III M.D.   On: 10/26/2016 14:36    EKG:   Orders placed or performed during the hospital encounter of 10/26/16  . EKG 12-Lead  . EKG 12-Lead  EKG revealed sinus tachycardia at the rate of 127 bpm, left axis  deviation, voltage criteria for LVH, anteroseptal infarct, age undetermined, no acute ST-T changes  IMPRESSION AND PLAN:    Principal Problem:   Altered mental status Active Problems:   Acute respiratory failure with hypoxia (HCC)   Wheezing   Dyspnea   Hyponatremia   Hepatomegaly   Primary localized osteoarthritis of left knee  #1. Acute respiratory failure with hypoxia, wheezing, dyspnea, initiate patient on oxygen therapy, ABGs revealed no CO2 retention, the chest x-ray, initiate patient on Xopenex inhalation, steroids intravenously and inhalation, follow clinically, get EKG.  May need to get CT angiogram of the chest. If chest x-ray is unremarkable #2. Hyponatremia, recheck labs, I'm concerned about SIADH as patient's sodium has been declining on IV fluid administration in the hospital, discontinue IV fluids #3. Altered mental status, could be opiate related, try Narcan, get CT of the head when patient is stabilized, get ammonia level to rule out hepatic encephalopathy. Hold sedating medications including opiates, amitriptyline, Benadryl, methocarbamol, gabapentin, Remeron, Ambien. Restart those medications depending on patient's clinical status .  #4. Leukocytosis, follow closely, questionable infection, as patient had mild elevation of temperature to 100, although it could be related to recent operation, get blood cultures, chest x-ray and urinalysis   All the records are reviewed and case discussed with Consulting provider. Management plans discussed with the patient, family and they are in agreement.  CODE STATUS: Full code  TOTAL CRITICAL CARE TIME TAKING CARE OF THIS PATIENT: 50 minutes.    Theodoro Grist M.D on 10/28/2016 at 12:35 PM  Between 7am to 6pm - Pager - [phone]  After 6pm go to [email or web address] - password [email or web address] [facility]  Office  [phone]  CC: Primary care Physician: PROVIDER NOT IN SYSTEM

## 2016-10-28 NOTE — Progress Notes (Addendum)
Clinical Education officer, museum (CSW) met with patient to discuss bringing her HIV medication from home to rehab with her. Patient was sleeping soundly and would not wake up. RN was notified. CSW attempted to contact patient's fiance [name] however he did not answer and a voicemail could not be left. CSW will continue to follow and assist as needed.   CSW attempted to contact patient's fiance [name] two more times with no success.   [employer], LCSW [phone]

## 2016-10-28 NOTE — Progress Notes (Signed)
PT Cancellation Note  Patient Details Name: Lisa Hawkins MRN: [id number] DOB: [birth year]   Cancelled Treatment:    Reason Eval/Treat Not Completed: Fatigue/lethargy limiting ability to participate   Attempted session x 3 this am.  Pt sleeping soundly on all attempts.  Pt did not awaked with verbal or tactile stimulation.  Discussed with CNA who stated pt did not awaken for breakfast or stir with vital signs this morning.  Will continue to attempt throughout the day.    Chesley Noon 10/28/2016, 10:28 AM

## 2016-10-28 NOTE — Plan of Care (Signed)
Patient seems to be more alert since 2nd narcan was giving.  Though still sleepy, responding easily to voice and verbally complaining when being moved or "messed with."  Assessment indicates patient is back to baseline.

## 2016-10-28 NOTE — Progress Notes (Signed)
[place] rounding the unit was asked to visit the Pt, whom he had visited the day before. [place] visited the Pt, but the Pt as asleep. [facility] was to do a follow visit with the Pt at an appropriate time.      10/28/16 1700  Clinical Encounter Type  Visited With Patient  Visit Type Follow-up  Spiritual Encounters  Spiritual Needs Prayer;Other (Comment)

## 2016-10-28 NOTE — Progress Notes (Signed)
  Subjective: 2 Days Post-Op Procedure(s) (LRB): TOTAL KNEE ARTHROPLASTY (Left) Patient reports pain as moderate.   Patient seen in rounds with Dr. Rudene Christians. Patient is well, and has had no acute complaints or problems Plan is to go Rehab after hospital stay. Negative for chest pain and shortness of breath Fever:  100 temp Gastrointestinal: Negative for nausea and vomiting  Objective: Vital signs in last 24 hours: Temp:  [98 F (36.7 C)-100 F (37.8 C)] 100 F (37.8 C) (11/30 0510) Pulse Rate:  [115-125] 119 (11/30 0510) Resp:  [18-20] 18 (11/30 0510) BP: (152-175)/(73-87) 164/87 (11/30 0510) SpO2:  [92 %-97 %] 92 % (11/30 0510) Weight:  [96 kg (211 lb 10.3 oz)] 96 kg (211 lb 10.3 oz) (11/29 0900)  Intake/Output from previous day:  Intake/Output Summary (Last 24 hours) at 10/28/16 0607 Last data filed at 10/27/16 1244  Gross per 24 hour  Intake          2303.75 ml  Output              400 ml  Net          1903.75 ml    Intake/Output this shift: No intake/output data recorded.  Labs:  Recent Labs  10/26/16 1525 10/27/16 0432 10/28/16 0326  HGB 14.5 13.3 13.0    Recent Labs  10/27/16 0432 10/28/16 0326  WBC 10.2 16.9*  RBC 4.13 4.08  HCT 38.9 38.1  PLT 251 224    Recent Labs  10/27/16 0432 10/28/16 0326  NA 134* 129*  K 4.0 3.3*  CL 103 95*  CO2 24 26  BUN 10 6  CREATININE 0.80 0.73  GLUCOSE 158* 135*  CALCIUM 8.4* 8.9   No results for input(s): LABPT, INR in the last 72 hours.   EXAM General - Patient is Alert and Confused Extremity - Sensation intact distally No cellulitis present Compartment soft Dressing/Incision - clean, dry, no drainage, wound VAC working well. Motor Function - intact, moving foot and toes well on exam. The patient is in a knee immobilizer and has no range of motion  Past Medical History:  Diagnosis Date  . Anxiety   . Arthritis   . GERD (gastroesophageal reflux disease)   . Hepatitis    Hep C - treated with Harvoni   . HIV (human immunodeficiency virus infection) ([place])   . Hypertension   . Neuromuscular disorder (HCC)    toes  . Neuropathy ([place])   . PONV (postoperative nausea and vomiting)     Assessment/Plan: 2 Days Post-Op Procedure(s) (LRB): TOTAL KNEE ARTHROPLASTY (Left) Active Problems:   Primary localized osteoarthritis of left knee  Estimated body mass index is 37.49 kg/m as calculated from the following:   Height as of this encounter: 5\' 3"  (1.6 m).   Weight as of this encounter: 96 kg (211 lb 10.3 oz). Up with therapy Discharge to SNF, most likely Friday  DVT Prophylaxis - Lovenox, Foot Pumps and TED hose Weight-Bearing as tolerated to left leg  Reche Dixon, PA-C Orthopaedic Surgery 10/28/2016, 6:07 AM

## 2016-10-29 DIAGNOSIS — M25569 Pain in unspecified knee: Secondary | ICD-10-CM | POA: Diagnosis not present

## 2016-10-29 DIAGNOSIS — F419 Anxiety disorder, unspecified: Secondary | ICD-10-CM | POA: Diagnosis not present

## 2016-10-29 DIAGNOSIS — Y92239 Unspecified place in hospital as the place of occurrence of the external cause: Secondary | ICD-10-CM | POA: Diagnosis not present

## 2016-10-29 DIAGNOSIS — S82009A Unspecified fracture of unspecified patella, initial encounter for closed fracture: Secondary | ICD-10-CM | POA: Diagnosis not present

## 2016-10-29 DIAGNOSIS — D72829 Elevated white blood cell count, unspecified: Secondary | ICD-10-CM | POA: Diagnosis not present

## 2016-10-29 DIAGNOSIS — I10 Essential (primary) hypertension: Secondary | ICD-10-CM | POA: Diagnosis not present

## 2016-10-29 DIAGNOSIS — G629 Polyneuropathy, unspecified: Secondary | ICD-10-CM | POA: Diagnosis not present

## 2016-10-29 DIAGNOSIS — M6281 Muscle weakness (generalized): Secondary | ICD-10-CM | POA: Diagnosis not present

## 2016-10-29 DIAGNOSIS — G609 Hereditary and idiopathic neuropathy, unspecified: Secondary | ICD-10-CM | POA: Diagnosis not present

## 2016-10-29 DIAGNOSIS — R4182 Altered mental status, unspecified: Secondary | ICD-10-CM | POA: Diagnosis not present

## 2016-10-29 DIAGNOSIS — B20 Human immunodeficiency virus [HIV] disease: Secondary | ICD-10-CM | POA: Diagnosis not present

## 2016-10-29 DIAGNOSIS — M13862 Other specified arthritis, left knee: Secondary | ICD-10-CM | POA: Diagnosis not present

## 2016-10-29 DIAGNOSIS — E871 Hypo-osmolality and hyponatremia: Secondary | ICD-10-CM | POA: Diagnosis not present

## 2016-10-29 DIAGNOSIS — J9601 Acute respiratory failure with hypoxia: Secondary | ICD-10-CM | POA: Diagnosis not present

## 2016-10-29 DIAGNOSIS — I739 Peripheral vascular disease, unspecified: Secondary | ICD-10-CM | POA: Diagnosis not present

## 2016-10-29 DIAGNOSIS — Z5189 Encounter for other specified aftercare: Secondary | ICD-10-CM | POA: Diagnosis not present

## 2016-10-29 DIAGNOSIS — R2689 Other abnormalities of gait and mobility: Secondary | ICD-10-CM | POA: Diagnosis not present

## 2016-10-29 DIAGNOSIS — Z23 Encounter for immunization: Secondary | ICD-10-CM | POA: Diagnosis not present

## 2016-10-29 DIAGNOSIS — Z7401 Bed confinement status: Secondary | ICD-10-CM | POA: Diagnosis not present

## 2016-10-29 DIAGNOSIS — M25562 Pain in left knee: Secondary | ICD-10-CM | POA: Diagnosis not present

## 2016-10-29 LAB — COMPREHENSIVE METABOLIC PANEL
ALK PHOS: 100 U/L (ref 38–126)
ALT: 14 U/L (ref 14–54)
ANION GAP: 7 (ref 5–15)
AST: 21 U/L (ref 15–41)
Albumin: 3.1 g/dL — ABNORMAL LOW (ref 3.5–5.0)
BUN: 11 mg/dL (ref 6–20)
CALCIUM: 9.3 mg/dL (ref 8.9–10.3)
CO2: 29 mmol/L (ref 22–32)
Chloride: 99 mmol/L — ABNORMAL LOW (ref 101–111)
Creatinine, Ser: 0.8 mg/dL (ref 0.44–1.00)
Glucose, Bld: 152 mg/dL — ABNORMAL HIGH (ref 65–99)
POTASSIUM: 3.9 mmol/L (ref 3.5–5.1)
Sodium: 135 mmol/L (ref 135–145)
TOTAL PROTEIN: 7.4 g/dL (ref 6.5–8.1)
Total Bilirubin: 0.5 mg/dL (ref 0.3–1.2)

## 2016-10-29 LAB — CBC
HEMATOCRIT: 36.7 % (ref 35.0–47.0)
HEMOGLOBIN: 12.4 g/dL (ref 12.0–16.0)
MCH: 31.7 pg (ref 26.0–34.0)
MCHC: 33.7 g/dL (ref 32.0–36.0)
MCV: 94 fL (ref 80.0–100.0)
Platelets: 219 10*3/uL (ref 150–440)
RBC: 3.91 MIL/uL (ref 3.80–5.20)
RDW: 16 % — AB (ref 11.5–14.5)
WBC: 15.3 10*3/uL — AB (ref 3.6–11.0)

## 2016-10-29 MED ORDER — LEVOFLOXACIN 500 MG PO TABS: 500.0000 mg | ORAL_TABLET | ORAL | Status: DC

## 2016-10-29 MED ORDER — TRAMADOL HCL 50 MG PO TABS: 50.0000 mg | ORAL_TABLET | Freq: Four times a day (QID) | ORAL | 0 refills | Status: DC

## 2016-10-29 MED ORDER — LEVOFLOXACIN 500 MG PO TABS: 500.0000 mg | ORAL_TABLET | ORAL | 0 refills | Status: AC

## 2016-10-29 MED ORDER — LEVOFLOXACIN 500 MG PO TABS: 500.0000 mg | ORAL_TABLET | Freq: Every day | ORAL | 0 refills | Status: DC

## 2016-10-29 MED ORDER — TRAMADOL HCL 50 MG PO TABS
50.0000 mg | ORAL_TABLET | Freq: Four times a day (QID) | ORAL | Status: DC
  Administered 2016-10-29 (×2): 50 mg via ORAL
  Filled 2016-10-29 (×2): qty 1

## 2016-10-29 NOTE — Progress Notes (Addendum)
Pharmacy Antibiotic Note  Lisa Hawkins is a 65 y.o. female admitted on 10/26/2016 with pneumonia/HCAP.  Pharmacy has been consulted for Levofloxacin dosing.  Chest Xray with possible bronchitis.  Urine culture 11/24 with 100k citrobacter, patient received ciprofloxacin x 2 doses. Urine culture 11/27 with 30k diptheroids  Plan: Discussed with PA Reche Dixon who is not concerned with respiratory infection; patient thought to be oversedated from pain medication post operatively. I would still suggest treating for UTI given previous partial treatment. Continue levofloxacin 500mg  PO x 2 more doses.    Height: 5\' 3"  (160 cm) Weight: 211 lb 10.3 oz (96 kg) IBW/kg (Calculated) : 52.4  Temp (24hrs), Avg:98.5 F (36.9 C), Min:97.2 F (36.2 C), Max:100.8 F (38.2 C)   Recent Labs Lab 10/26/16 1525 10/27/16 0432 10/28/16 0326 10/28/16 1214 10/29/16 0633  WBC 6.4 10.2 16.9* 16.7* 15.3*  CREATININE 0.99 0.80 0.73 0.77 0.80    Estimated Creatinine Clearance: 78.3 mL/min (by C-G formula based on SCr of 0.8 mg/dL).    Allergies  Allergen Reactions  . Penicillins Swelling and Rash    Has patient had a PCN reaction causing immediate rash, facial/tongue/throat swelling, SOB or lightheadedness with hypotension: Yes Has patient had a PCN reaction causing severe rash involving mucus membranes or skin necrosis: No Has patient had a PCN reaction that required hospitalization Yes Has patient had a PCN reaction occurring within the last 10 years: No If all of the above answers are "NO", then may proceed with Cephalosporin use.     Antimicrobials this admission: 11/30 levofloxacin >>  Dose adjustments this admission:  Microbiology results: 11/30 blood x 2 11/29 Urine: 30k diphtheroids 11/24 Urine: > 100k citrobacter (inadequately treated with cipro x 1 day)   Thank you for allowing pharmacy to be a part of this patient's care.  Cheri Guppy, PharmD, BCPS Clinical  Pharmacist 10/29/2016 9:43 AM

## 2016-10-29 NOTE — Progress Notes (Signed)
Physical Therapy Treatment Patient Details Name: Lisa Hawkins MRN: [id number] DOB: [birth year] Today's Date: 10/29/2016    History of Present Illness Pt is a 65 y.o. female s/p L TKA 10/26/16.  Of note pt with R TKA 07/15/16.  Pt also reporting fx in R knee and needing to wear KI when OOB.  Discussed this with Dr. Rudene Christians in person AM of 10/27/16 and MD Rudene Christians reports pt with R knee inferior pole patellar fx (about 2 weeks prior) and pt to be WBAT R LE with R KI donned and no R knee ROM.      PT Comments    Pt in chair ready to return to bed.  Transferred back to bed with mod a x 2 to stand and min a x 2 for safety to turn.  Safety improved this pm and turned fully before sitting.  Participated in exercises as described below. Pt tolerated ROM a bit better this pm allowing for more flexion and exercises but still remains significantly guarded with ROM limited by pain.     Follow Up Recommendations  SNF     Equipment Recommendations  Rolling walker with 5" wheels    Recommendations for Other Services       Precautions / Restrictions Precautions Precautions: Fall;Knee Required Braces or Orthoses: Knee Immobilizer - Right;Knee Immobilizer - Left Knee Immobilizer - Right: On at all times Knee Immobilizer - Left: On when out of bed or walking;Discontinue once straight leg raise with < 10 degree lag Restrictions Weight Bearing Restrictions: No RLE Weight Bearing: Weight bearing as tolerated LLE Weight Bearing: Weight bearing as tolerated Other Position/Activity Restrictions: R LE KI and WBAT; NO ROM R knee (per verbal discussion with MD Rudene Christians 10/27/16)    Mobility  Bed Mobility Overal bed mobility: Needs Assistance Bed Mobility: Sit to Supine     Supine to sit: Min assist;+2 for physical assistance     General bed mobility comments: encouragement needed.  initially stating she was unable but did well with encouragment  Transfers Overall transfer level: Needs  assistance Equipment used: Rolling walker (2 wheeled) Transfers: Sit to/from Stand Sit to Stand: Mod assist;+2 physical assistance;Min assist         General transfer comment: did well with hand placements and cues to increase safety this pm  Ambulation/Gait Ambulation/Gait assistance: Min assist;+2 safety/equipment Ambulation Distance (Feet): 3 Feet Assistive device: Rolling walker (2 wheeled) Gait Pattern/deviations: Step-to pattern Gait velocity: decreased Gait velocity interpretation: <1.8 ft/sec, indicative of risk for recurrent falls General Gait Details: B KI's donned, only took steps back to bed, limited by pain   Stairs            Wheelchair Mobility    Modified Rankin (Stroke Patients Only)       Balance Overall balance assessment: Needs assistance Sitting-balance support: Feet supported Sitting balance-Leahy Scale: Fair     Standing balance support: Bilateral upper extremity supported Standing balance-Leahy Scale: Fair                      Cognition Arousal/Alertness: Awake/alert Behavior During Therapy: WFL for tasks assessed/performed Overall Cognitive Status: No family/caregiver present to determine baseline cognitive functioning       Memory: Decreased recall of precautions              Exercises Total Joint Exercises Ankle Circles/Pumps: AROM;Strengthening;Both;10 reps;Supine Quad Sets: AROM;Strengthening;Left;10 reps;Supine Heel Slides: AAROM;Left;10 reps;Supine Straight Leg Raises: AAROM;Strengthening;Left;10 reps;Supine Goniometric ROM: 5-30 (allowed more ROM this pm,  still significantly guarded by pt)    General Comments        Pertinent Vitals/Pain Pain Assessment: 0-10 Pain Score: 8  Pain Location: L knee with motion Pain Descriptors / Indicators: Aching;Discomfort;Operative site guarding;Sore Pain Intervention(s): Limited activity within patient's tolerance;Ice applied    Home Living                       Prior Function            PT Goals (current goals can now be found in the care plan section) Progress towards PT goals: Progressing toward goals    Frequency    BID      PT Plan Current plan remains appropriate    Co-evaluation             End of Session Equipment Utilized During Treatment: Right knee immobilizer;Left knee immobilizer Activity Tolerance: Patient limited by pain Patient left: in bed;with call bell/phone within reach;with bed alarm set     Time: [id number] PT Time Calculation (min) (ACUTE ONLY): 24 min  Charges:  $Gait Training: 8-22 mins $Therapeutic Exercise: 8-22 mins                    G Codes:      Chesley Noon 07-Nov-2016, 2:26 PM

## 2016-10-29 NOTE — Progress Notes (Addendum)
Called report to [place] at [facility]. Going to room 604. Nurse tech will ready patient for transport. IV removed and belongings packed. EMS called.

## 2016-10-29 NOTE — Discharge Instructions (Signed)
During the episode of respi distress- Xray chest showed findings of bronchitis- so she is given 3 days oral levaquine on discharge.   TOTAL KNEE REPLACEMENT POSTOPERATIVE DIRECTIONS  Knee Rehabilitation, Guidelines Following Surgery  Results after knee surgery are often greatly improved when you follow the exercise, range of motion and muscle strengthening exercises prescribed by your doctor. Safety measures are also important to protect the knee from further injury. Any time any of these exercises cause you to have increased pain or swelling in your knee joint, decrease the amount until you are comfortable again and slowly increase them. If you have problems or questions, call your caregiver or physical therapist for advice.   HOME CARE INSTRUCTIONS  Remove items at home which could result in a fall. This includes throw rugs or furniture in walking pathways.   ICE using the Polar Care unit to the affected knee every three hours for 30 minutes at a time and then as needed for pain and swelling.  Place a dry towel or pillow case over the knee before applying the Polar Care Unit.  Continue to use ice on the knee for pain and swelling from surgery. You may notice swelling that will progress down to the foot and ankle.  This is normal after surgery.  Elevate the leg when you are not up walking on it.    Continue to use the breathing machine which will help keep your temperature down.  It is common for your temperature to cycle up and down following surgery, especially at night when you are not up moving around and exerting yourself.  The breathing machine keeps your lungs expanded and your temperature down.  Do not place pillow under knee, focus on keeping the knee straight while resting  DIET You may resume your previous home diet once your are discharged from the hospital.  DRESSING / WOUND CARE / SHOWERING Keep the surgical dressing until follow up.  The dressing is water proof, so you can shower  without any extra covering.  IF THE DRESSING FALLS OFF or the wound gets wet inside, change the dressing with sterile gauze.  Please use good hand washing techniques before changing the dressing.  Do not use any lotions or creams on the incision until instructed by your surgeon.    The patient has a wound VAC on the left knee. This will stay on for one week before removal and application of a regular waterproof dressing.  You need to keep your wound dry after being discharged home.  Just keep the incision dry and apply a dry gauze dressing on daily. Change the surgical dressing only if needed and reapply a dry dressing each time.  ACTIVITY Walk with your walker as instructed. Use walker as long as suggested by your caregivers. Avoid periods of inactivity such as sitting longer than an hour when not asleep. This helps prevent blood clots.  You may resume a sexual relationship in one month or when given the OK by your doctor.  You may return to work once you are cleared by your doctor.  Do not drive a car for 6 weeks or until released by you surgeon.  Do not drive while taking narcotics.  WEIGHT BEARING Weight bearing as tolerated with assist device (walker, cane, etc) as directed, use it as long as suggested by your surgeon or therapist, typically at least 4-6 weeks. The patient will stay in her knee immobilizer at all times with no range of motion of the right  knee at this time. The patient has a patella fracture.  POSTOPERATIVE CONSTIPATION PROTOCOL Constipation - defined medically as fewer than three stools per week and severe constipation as less than one stool per week.  One of the most common issues patients have following surgery is constipation.  Even if you have a regular bowel pattern at home, your normal regimen is likely to be disrupted due to multiple reasons following surgery.  Combination of anesthesia, postoperative narcotics, change in appetite and fluid intake all can affect  your bowels.  In order to avoid complications following surgery, here are some recommendations in order to help you during your recovery period.  Colace (docusate) - Pick up an over-the-counter form of Colace or another stool softener and take twice a day as long as you are requiring postoperative pain medications.  Take with a full glass of water daily.  If you experience loose stools or diarrhea, hold the colace until you stool forms back up.  If your symptoms do not get better within 1 week or if they get worse, check with your doctor.  Dulcolax (bisacodyl) - Pick up over-the-counter and take as directed by the product packaging as needed to assist with the movement of your bowels.  Take with a full glass of water.  Use this product as needed if not relieved by Colace only.   MiraLax (polyethylene glycol) - Pick up over-the-counter to have on hand.  MiraLax is a solution that will increase the amount of water in your bowels to assist with bowel movements.  Take as directed and can mix with a glass of water, juice, soda, coffee, or tea.  Take if you go more than two days without a movement. Do not use MiraLax more than once per day. Call your doctor if you are still constipated or irregular after using this medication for 7 days in a row.  If you continue to have problems with postoperative constipation, please contact the office for further assistance and recommendations.  If you experience "the worst abdominal pain ever" or develop nausea or vomiting, please contact the office immediatly for further recommendations for treatment.  ITCHING  If you experience itching with your medications, try taking only a single pain pill, or even half a pain pill at a time.  You can also use Benadryl over the counter for itching or also to help with sleep.   TED HOSE STOCKINGS Wear the elastic stockings on both legs for six weeks following surgery during the day but you may remove then at night for  sleeping.  MEDICATIONS See your medication summary on the After Visit Summary that the nursing staff will review with you prior to discharge.  You may have some home medications which will be placed on hold until you complete the course of blood thinner medication.  It is important for you to complete the blood thinner medication as prescribed by your surgeon.  Continue your approved medications as instructed at time of discharge. Use caution with narcotics. The patient developed respiratory depression postoperatively. The patient is slowly returning the pain medication with better control.  PRECAUTIONS If you experience chest pain or shortness of breath - call 911 immediately for transfer to the hospital emergency department.  If you develop a fever greater that 101 F, purulent drainage from wound, increased redness or drainage from wound, foul odor from the wound/dressing, or calf pain - CONTACT YOUR SURGEON.  FOLLOW-UP APPOINTMENTS Make sure you keep all of your appointments after your operation with your surgeon and caregivers. You should call the office at the above phone number and make an appointment for approximately two weeks after the date of your surgery or on the date instructed by your surgeon outlined in the "After Visit Summary".   RANGE OF MOTION AND STRENGTHENING EXERCISES  Rehabilitation of the knee is important following a knee injury or an operation. After just a few days of immobilization, the muscles of the thigh which control the knee become weakened and shrink (atrophy). Knee exercises are designed to build up the tone and strength of the thigh muscles and to improve knee motion. Often times heat used for twenty to thirty minutes before working out will loosen up your tissues and help with improving the range of motion but do not use heat for the first two weeks following surgery. These exercises can be done on a training  (exercise) mat, on the floor, on a table or on a bed. Use what ever works the best and is most comfortable for you Knee exercises include:  Leg Lifts - While your knee is still immobilized in a splint or cast, you can do straight leg raises. Lift the leg to 60 degrees, hold for 3 sec, and slowly lower the leg. Repeat 10-20 times 2-3 times daily. Perform this exercise against resistance later as your knee gets better.  Quad and Hamstring Sets - Tighten up the muscle on the front of the thigh (Quad) and hold for 5-10 sec. Repeat this 10-20 times hourly. Hamstring sets are done by pushing the foot backward against an object and holding for 5-10 sec. Repeat as with quad sets.   Leg Slides: Lying on your back, slowly slide your foot toward your buttocks, bending your knee up off the floor (only go as far as is comfortable). Then slowly slide your foot back down until your leg is flat on the floor again.  Angel Wings: Lying on your back spread your legs to the side as far apart as you can without causing discomfort.  A rehabilitation program following serious knee injuries can speed recovery and prevent re-injury in the future due to weakened muscles. Contact your doctor or a physical therapist for more information on knee rehabilitation.   IF YOU ARE TRANSFERRED TO A SKILLED REHAB FACILITY If the patient is transferred to a skilled rehab facility following release from the hospital, a list of the current medications will be sent to the facility for the patient to continue.  When discharged from the skilled rehab facility, please have the facility set up the patient's [place] prior to being released. Also, the skilled facility will be responsible for providing the patient with their medications at time of release from the facility to include their pain medication, the muscle relaxants, and their blood thinner medication. If the patient is still at the rehab facility at time of the two week  follow up appointment, the skilled rehab facility will also need to assist the patient in arranging follow up appointment in our office and any transportation needs.  MAKE SURE YOU:  Understand these instructions.  Get help right away if you are not doing well or get worse.    Pick up stool softner and laxative for home use following surgery while on pain medications. Do not submerge incision under water. Please use good hand washing techniques while changing dressing each day. May shower starting three days  after surgery. Please use a clean towel to pat the incision dry following showers. Continue to use ice for pain and swelling after surgery. Do not use any lotions or creams on the incision until instructed by your surgeon.

## 2016-10-29 NOTE — Progress Notes (Signed)
  Subjective: 3 Days Post-Op Procedure(s) (LRB): TOTAL KNEE ARTHROPLASTY (Left) Patient reports pain as moderate.  The patient is receiving Tylenol only because of respiratory depression from narcotics. Patient seen in rounds with Dr. Rudene Christians. Patient is well, and has had no acute complaints or problems Plan is to go Rehab after hospital stay. Negative for chest pain and shortness of breath Fever:  Normal temp Gastrointestinal: Negative for nausea and vomiting  Objective: Vital signs in last 24 hours: Temp:  [97.2 F (36.2 C)-100.8 F (38.2 C)] 97.5 F (36.4 C) (12/01 0428) Pulse Rate:  [98-122] 98 (12/01 0428) Resp:  [16-18] 16 (12/01 0428) BP: (124-164)/(65-85) 131/65 (12/01 0428) SpO2:  [93 %-100 %] 99 % (12/01 0428) FiO2 (%):  [21 %] 21 % (11/30 1425)  Intake/Output from previous day:  Intake/Output Summary (Last 24 hours) at 10/29/16 0619 Last data filed at 10/29/16 0431  Gross per 24 hour  Intake            537.5 ml  Output                0 ml  Net            537.5 ml    Intake/Output this shift: Total I/O In: 537.5 [I.V.:387.5; IV Piggyback:150] Out: -   Labs:  Recent Labs  10/26/16 1525 10/27/16 0432 10/28/16 0326 10/28/16 1214  HGB 14.5 13.3 13.0 13.6    Recent Labs  10/28/16 0326 10/28/16 1214  WBC 16.9* 16.7*  RBC 4.08 4.23  HCT 38.1 39.8  PLT 224 256    Recent Labs  10/28/16 0326 10/28/16 1214  NA 129* 129*  K 3.3* 3.5  CL 95* 92*  CO2 26 29  BUN 6 7  CREATININE 0.73 0.77  GLUCOSE 135* 125*  CALCIUM 8.9 9.2   No results for input(s): LABPT, INR in the last 72 hours.   EXAM General - Patient is Alert and orientated Extremity - Sensation intact distally No cellulitis present Compartment soft Dressing/Incision - clean, dry, no drainage, wound VAC working well. Motor Function - intact, moving foot and toes well on exam. The patient is in a knee immobilizer and has no range of motion  Past Medical History:  Diagnosis Date  .  Anxiety   . Arthritis   . GERD (gastroesophageal reflux disease)   . Hepatitis    Hep C - treated with Harvoni  . HIV (human immunodeficiency virus infection) ([place])   . Hypertension   . Neuromuscular disorder (HCC)    toes  . Neuropathy ([place])   . PONV (postoperative nausea and vomiting)     Assessment/Plan: 3 Days Post-Op Procedure(s) (LRB): TOTAL KNEE ARTHROPLASTY (Left) Principal Problem:   Altered mental status Active Problems:   Primary localized osteoarthritis of left knee   Wheezing   Dyspnea   Acute respiratory failure with hypoxia (HCC)   Hyponatremia   Hepatomegaly  Estimated body mass index is 37.49 kg/m as calculated from the following:   Height as of this encounter: 5\' 3"  (1.6 m).   Weight as of this encounter: 96 kg (211 lb 10.3 oz). Up with therapy Discharge to SNF, most likely Today. Pain management. The patient will slowly introduce tramadol and oxycodone in rehabilitation.  DVT Prophylaxis - Lovenox, Foot Pumps and TED hose Weight-Bearing as tolerated to left leg  Reche Dixon, PA-C Orthopaedic Surgery 10/29/2016, 6:19 AM

## 2016-10-29 NOTE — Progress Notes (Signed)
[place] rounding the unit made a follow-up visit with the Pt. Pt appeared to be in good spirit and requested prayers for fully recovery, which the Ch, provided, and then left.    10/29/16 1600  Clinical Encounter Type  Visited With Patient  Visit Type Follow-up  Spiritual Encounters  Spiritual Needs Prayer

## 2016-10-29 NOTE — Clinical Social Work Placement (Signed)
   CLINICAL SOCIAL WORK PLACEMENT  NOTE  Date:  10/29/2016  Patient Details  Name: Lisa Hawkins MRN: [id number] Date of Birth: 1951-11-24  Clinical Social Work is seeking post-discharge placement for this patient at the [place] level of care (*CSW will initial, date and re-position this form in  chart as items are completed):  Yes   Patient/family provided with [place] Work Department's list of facilities offering this level of care within the geographic area requested by the patient (or if unable, by the patient's family).  Yes   Patient/family informed of their freedom to choose among providers that offer the needed level of care, that participate in Medicare, Medicaid or managed care program needed by the patient, have an available bed and are willing to accept the patient.  Yes   Patient/family informed of [facility]'s ownership interest in [facility] and [facility], as well as of the fact that they are under no obligation to receive care at these facilities.  PASRR submitted to EDS on       PASRR number received on       Existing PASRR number confirmed on 10/27/16     FL2 transmitted to all facilities in geographic area requested by pt/family on 10/27/16     FL2 transmitted to all facilities within larger geographic area on       Patient informed that his/her managed care company has contracts with or will negotiate with certain facilities, including the following:        Yes   Patient/family informed of bed offers received.  Patient chooses bed at  ([facility] )     Physician recommends and patient chooses bed at      Patient to be transferred to  ([facility] ) on 10/29/16.  Patient to be transferred to facility by  [facility] )     Patient family notified on 10/29/16 of transfer.  Name of family member notified:   (CSW attempted to contact patient's fiance [name] however he did not answer and a voicemail could not be  left. )     PHYSICIAN       Additional Comment:    _______________________________________________ Danielly Ackerley, Veronia Beets, LCSW 10/29/2016, 11:58 AM

## 2016-10-29 NOTE — Discharge Summary (Signed)
Physician Discharge Summary  Subjective: 3 Days Post-Op Procedure(s) (LRB): TOTAL KNEE ARTHROPLASTY (Left) Patient reports pain as moderate.   Patient seen in rounds with Dr. Rudene Christians. Patient is well, and has had no acute complaints or problems. She is still working on pain control because of her respiratory depression on postop day 2. She responded to Narcan, and is only using Tylenol as we slowly introduce her pain medication. Patient is ready to go to rehabilitation for physical therapy  Physician Discharge Summary  Patient ID: Lisa Hawkins MRN: [id number] DOB/AGE: 04/25/1951 65 y.o.  Admit date: 10/26/2016 Discharge date: 10/29/2016  Admission Diagnoses:  Discharge Diagnoses:  Principal Problem:   Altered mental status Active Problems:   Primary localized osteoarthritis of left knee   Wheezing   Dyspnea   Acute respiratory failure with hypoxia (HCC)   Hyponatremia   Hepatomegaly   Discharged Condition: fair  Hospital Course: The patient is postop day 3 from a left total knee replacement. The patient sustained a patella fracture 2 weeks before surgery so she is in a knee immobilizer with no range of motion. The patient has not been able to ambulate postoperatively because of respiratory depression and inability to awaken. The patient received Narcan on postop day 2 after being ordered by internal medicine. After her second dose she began to respond. The patient is only using Tylenol for pain. She continues to have significant pain involving her knee and has tramadol being ordered. The patient will be discharged on postop day 3. During the episode of respi distress- X ray chest was done- which showed finding of bronchitis- so she is given 3 days of oral levaquin.  Treatments: surgery:  TOTAL KNEE ARTHROPLASTY (Left)  SURGEON: Laurene Footman, MD  ASSISTANTS: None  ANESTHESIA:   spinal  EBL:  Total I/O In: 1000 [I.V.:1000] Out: 150 [Urine:100; Blood:50]  BLOOD  ADMINISTERED:none  DRAINS: none   LOCAL MEDICATIONS USED:  MARCAINE    and OTHER Exparel  SPECIMEN:  No Specimen  DISPOSITION OF SPECIMEN:  N/A  COUNTS:  YES  TOURNIQUET:   61 minutes at 300 mmHg  IMPLANTS: Medacta GMK sphere total knee system 2+ femur left 2 tibia left with 10 mm insert and short stem, to patella all components cemented  Discharge Exam: Blood pressure (!) 144/64, pulse (!) 115, temperature 98.2 F (36.8 C), temperature source Oral, resp. rate 18, height 5\' 3"  (1.6 m), weight 96 kg (211 lb 10.3 oz), SpO2 100 %.   Disposition: 03-Skilled Nursing Facility     Medication List    TAKE these medications   amitriptyline 25 MG tablet Commonly known as:  ELAVIL Take 25 mg by mouth 3 (three) times daily.   amLODipine 10 MG tablet Commonly known as:  NORVASC Take 10 mg by mouth at bedtime.   aspirin EC 81 MG tablet Take 81 mg by mouth at bedtime.   citalopram 10 MG tablet Commonly known as:  CELEXA Take 10 mg by mouth at bedtime.   enoxaparin 40 MG/0.4ML injection Commonly known as:  LOVENOX Inject 0.4 mLs (40 mg total) into the skin daily.   gabapentin 300 MG capsule Commonly known as:  NEURONTIN Take 900 mg by mouth 2 (two) times daily.   hydrochlorothiazide 25 MG tablet Commonly known as:  HYDRODIURIL Take 25 mg by mouth at bedtime.   levofloxacin 500 MG tablet Commonly known as:  LEVAQUIN Take 1 tablet (500 mg total) by mouth daily. For 3 days   levofloxacin 500 MG  tablet Commonly known as:  LEVAQUIN Take 1 tablet (500 mg total) by mouth daily.   mirtazapine 30 MG tablet Commonly known as:  REMERON Take 30 mg by mouth at bedtime.   oxyCODONE 5 MG immediate release tablet Commonly known as:  Oxy IR/ROXICODONE Take 1 tablet (5 mg total) by mouth every 6 (six) hours as needed. What changed:  reasons to take this   oxyCODONE 15 mg 12 hr tablet Commonly known as:  OXYCONTIN Take 1 tablet (15 mg total) by mouth every 12 (twelve)  hours. What changed:  Another medication with the same name was changed. Make sure you understand how and when to take each.   Tapentadol HCl 100 MG Tabs Take 1 tablet (100 mg total) by mouth every 4 (four) hours as needed. What changed:  reasons to take this   traMADol 50 MG tablet Commonly known as:  ULTRAM Take 1 tablet (50 mg total) by mouth every 6 (six) hours.   TRIUMEQ 600-50-300 MG tablet Generic drug:  abacavir-dolutegravir-lamiVUDine Take 1 tablet by mouth at bedtime.       Contact information for follow-up providers    MENZ,MICHAEL, MD On 11/10/2016.   Specialty:  Orthopedic Surgery Why:  For staple removal at 2:15 Contact information: [address] [facility] [place] [address] [phone]            Contact information for after-discharge care    Destination    [facility].   Specialty:  [place] information: [address] [place] [phone]                  Signed: Prescott Parma, Murna Backer 10/29/2016, 10:23 AM   Objective: Vital signs in last 24 hours: Temp:  [97.2 F (36.2 C)-100.8 F (38.2 C)] 98.2 F (36.8 C) (12/01 0851) Pulse Rate:  [98-118] 115 (12/01 0851) Resp:  [16-18] 18 (12/01 0851) BP: (124-164)/(64-85) 144/64 (12/01 0851) SpO2:  [98 %-100 %] 100 % (12/01 0851) FiO2 (%):  [21 %] 21 % (11/30 1425)  Intake/Output from previous day:  Intake/Output Summary (Last 24 hours) at 10/29/16 1023 Last data filed at 10/29/16 0929  Gross per 24 hour  Intake            777.5 ml  Output                0 ml  Net            777.5 ml    Intake/Output this shift: Total I/O In: 240 [P.O.:240] Out: -   Labs:  Recent Labs  10/26/16 1525 10/27/16 0432 10/28/16 0326 10/28/16 1214 10/29/16 0633  HGB 14.5 13.3 13.0 13.6 12.4    Recent Labs  10/28/16 1214 10/29/16 0633  WBC 16.7* 15.3*  RBC 4.23 3.91  HCT 39.8 36.7  PLT 256 219     Recent Labs  10/28/16 1214 10/29/16 0633  NA 129* 135  K 3.5 3.9  CL 92* 99*  CO2 29 29  BUN 7 11  CREATININE 0.77 0.80  GLUCOSE 125* 152*  CALCIUM 9.2 9.3   No results for input(s): LABPT, INR in the last 72 hours.  EXAM: General - Patient is Alert and Oriented Extremity - Dorsiflexion/Plantar flexion intact No cellulitis present Compartment soft Incision - clean, dry, wound VAC in place under the knee immobilizer Motor Function -  planar flexion and dorsiflexion of the left foot.  Assessment/Plan: 3 Days Post-Op Procedure(s) (LRB): TOTAL KNEE  ARTHROPLASTY (Left) Procedure(s) (LRB): TOTAL KNEE ARTHROPLASTY (Left) Past Medical History:  Diagnosis Date  . Anxiety   . Arthritis   . GERD (gastroesophageal reflux disease)   . Hepatitis    Hep C - treated with Harvoni  . HIV (human immunodeficiency virus infection) ([place])   . Hypertension   . Neuromuscular disorder (HCC)    toes  . Neuropathy ([place])   . PONV (postoperative nausea and vomiting)    Principal Problem:   Altered mental status Active Problems:   Primary localized osteoarthritis of left knee   Wheezing   Dyspnea   Acute respiratory failure with hypoxia (HCC)   Hyponatremia   Hepatomegaly  Estimated body mass index is 37.49 kg/m as calculated from the following:   Height as of this encounter: 5\' 3"  (1.6 m).   Weight as of this encounter: 96 kg (211 lb 10.3 oz). Discharge to SNF Diet - Regular diet Follow up - in 2 weeks Activity - WBAT Disposition - Rehab Condition Upon Discharge - Stable DVT Prophylaxis - Lovenox and TED hose  Reche Dixon, PA-C Orthopaedic Surgery 10/29/2016, 10:23 AM

## 2016-10-29 NOTE — Progress Notes (Signed)
Patient is medically stable for D/C to [facility] today. Per [facility] liaison patient will go to room 604. RN will call report to Yaakov Guthrie and arrange EMS for transport. [facility] authorization has been received. Auth # [id number]. Clinical Education officer, museum (CSW) sent D/C orders to [employer] via [employer]. Patient is aware of above. CSW attempted to contact patient's fiance [name] however he did not answer and a voicemail could not be left. Per patient her fiance [name] will pick up a 30 day supply of her HIV medicine Trimeq today at 5:30 and will bring it to [facility]. Please reconsult if future social work needs arise. CSW signing off.   [employer], LCSW [phone]

## 2016-10-29 NOTE — Progress Notes (Signed)
[facility] NAME: Lisa Hawkins    MR#:  [id number]  DATE OF BIRTH:  04-Aug-1951  SUBJECTIVE:  CHIEF COMPLAINT:  No chief complaint on file.  Had an episode of Unresponsiveness.  Today completely alert and oriented.  REVIEW OF SYSTEMS:  CONSTITUTIONAL: No fever, fatigue or weakness.  EYES: No blurred or double vision.  EARS, NOSE, AND THROAT: No tinnitus or ear pain.  RESPIRATORY: No cough, shortness of breath, wheezing or hemoptysis.  CARDIOVASCULAR: No chest pain, orthopnea, edema.  GASTROINTESTINAL: No nausea, vomiting, diarrhea or abdominal pain.  GENITOURINARY: No dysuria, hematuria.  ENDOCRINE: No polyuria, nocturia,  HEMATOLOGY: No anemia, easy bruising or bleeding SKIN: No rash or lesion. MUSCULOSKELETAL: No joint pain or arthritis.   NEUROLOGIC: No tingling, numbness, weakness.  PSYCHIATRY: No anxiety or depression.   ROS  DRUG ALLERGIES:   Allergies  Allergen Reactions  . Penicillins Swelling and Rash    Has patient had a PCN reaction causing immediate rash, facial/tongue/throat swelling, SOB or lightheadedness with hypotension: Yes Has patient had a PCN reaction causing severe rash involving mucus membranes or skin necrosis: No Has patient had a PCN reaction that required hospitalization Yes Has patient had a PCN reaction occurring within the last 10 years: No If all of the above answers are "NO", then may proceed with Cephalosporin use.     VITALS:  Blood pressure (!) 144/64, pulse (!) 115, temperature 98.2 F (36.8 C), temperature source Oral, resp. rate 18, height 5\' 3"  (1.6 m), weight 96 kg (211 lb 10.3 oz), SpO2 100 %.  PHYSICAL EXAMINATION:  GENERAL:  65 y.o.-year-old patient lying in the bed with no acute distress.  EYES: Pupils equal, round, reactive to light and accommodation. No scleral icterus. Extraocular muscles intact.  HEENT: Head atraumatic, normocephalic. Oropharynx and nasopharynx clear.   NECK:  Supple, no jugular venous distention. No thyroid enlargement, no tenderness.  LUNGS: Normal breath sounds bilaterally, no wheezing, rales,rhonchi or crepitation. No use of accessory muscles of respiration.  CARDIOVASCULAR: S1, S2 normal. No murmurs, rubs, or gallops.  ABDOMEN: Soft, nontender, nondistended. Bowel sounds present. No organomegaly or mass.  EXTREMITIES: No pedal edema, cyanosis, or clubbing. Left knee dressing present. NEUROLOGIC: Cranial nerves II through XII are intact. Muscle strength 5/5 in all extremities. Sensation intact. Gait not checked.  PSYCHIATRIC: The patient is alert and oriented x 3.  SKIN: No obvious rash, lesion, or ulcer.   Physical Exam LABORATORY PANEL:   CBC  Recent Labs Lab 10/29/16 0633  WBC 15.3*  HGB 12.4  HCT 36.7  PLT 219   ------------------------------------------------------------------------------------------------------------------  Chemistries   Recent Labs Lab 10/29/16 0633  NA 135  K 3.9  CL 99*  CO2 29  GLUCOSE 152*  BUN 11  CREATININE 0.80  CALCIUM 9.3  AST 21  ALT 14  ALKPHOS 100  BILITOT 0.5   ------------------------------------------------------------------------------------------------------------------  Cardiac Enzymes No results for input(s): TROPONINI in the last 168 hours. ------------------------------------------------------------------------------------------------------------------  RADIOLOGY:  Ct Head Wo Contrast  Result Date: 10/28/2016 CLINICAL DATA:  HIV positive.  Found unresponsive today. EXAM: CT HEAD WITHOUT CONTRAST TECHNIQUE: Contiguous axial images were obtained from the base of the skull through the vertex without intravenous contrast. COMPARISON:  01/04/2008 FINDINGS: Brain: No evidence of malformation, atrophy, old or acute small or large vessel infarction, mass lesion, hemorrhage, hydrocephalus or extra-axial collection. No evidence of pituitary lesion. Vascular: There is  atherosclerotic calcification of the major vessels at the base of the  brain. Skull: Normal.  No fracture or focal bone lesion. Sinuses/Orbits: Visualized sinuses are clear. No fluid in the middle ears or mastoids. Visualized orbits are normal. Other: None significant IMPRESSION: Normal with the exception of atherosclerotic calcification of the major vessels at the base of the brain. Electronically Signed   By: Nelson Chimes M.D.   On: 10/28/2016 15:47   Dg Chest Port 1 View  Result Date: 10/28/2016 CLINICAL DATA:  Wheezing.  Recent knee arthroplasty.  Smoker. EXAM: PORTABLE CHEST 1 VIEW COMPARISON:  03/14/2012 FINDINGS: The cardiomediastinal silhouette is within normal limits. The lungs are slightly less well inflated than on the prior study. Peribronchial thickening has increased. Linear opacities in the left greater than right lung bases are compatible with subsegmental atelectasis. No pleural effusion or pneumothorax is identified. No acute osseous abnormality is seen. IMPRESSION: Increased peribronchial thickening suggestive of bronchitis with bibasilar subsegmental atelectasis. Electronically Signed   By: Logan Bores M.D.   On: 10/28/2016 13:35    ASSESSMENT AND PLAN:   Principal Problem:   Altered mental status Active Problems:   Primary localized osteoarthritis of left knee   Wheezing   Dyspnea   Acute respiratory failure with hypoxia (HCC)   Hyponatremia   Hepatomegaly  #1. Acute respiratory failure with hypoxia, acute bronchitis   Likely was due to opioid use, completely alert and oriented now,.   Finding of bronchitis on Xray chest- Oral levaquin for 3 days on discharge. #2. Hyponatremia, recheck labs,    Stable now. #3. Altered mental status, could be opiate related,    Avoid opioids. #4. Leukocytosis, follow closely, questionable bronchitis, treat for 3 days.    All the records are reviewed and case discussed with Care Management/Social Workerr. Management plans discussed  with the patient, family and they are in agreement.  CODE STATUS: full.  TOTAL TIME TAKING CARE OF THIS PATIENT: 35 minutes.     POSSIBLE D/C IN 1-2 DAYS, DEPENDING ON CLINICAL CONDITION.   Vaughan Basta M.D on 10/29/2016   Between 7am to 6pm - Pager - [phone]  After 6pm go to [email or web address] - password [email or web address] [facility]  Office  [phone]  CC: Primary care physician; PROVIDER NOT IN SYSTEM  Note: This dictation was prepared with Dragon dictation along with smaller phrase technology. Any transcriptional errors that result from this process are unintentional.

## 2016-10-29 NOTE — Progress Notes (Signed)
Physical Therapy Treatment Patient Details Name: Lisa Hawkins MRN: [id number] DOB: 11-25-1951 Today's Date: 10/29/2016    History of Present Illness Pt is a 65 y.o. female s/p L TKA 10/26/16.  Of note pt with R TKA 07/15/16.  Pt also reporting fx in R knee and needing to wear KI when OOB.  Discussed this with Dr. Rudene Hawkins in person AM of 10/27/16 and MD Lisa Hawkins reports pt with R knee inferior pole patellar fx (about 2 weeks prior) and pt to be WBAT R LE with R KI donned and no R knee ROM.      PT Comments    Pt in bed with right le hanging off and bent at about a 80 degree angle upon arrival.  Discussed MD orders for no ROM RLE.  Stated she was aware but was incontinent of urine and was trying to keep her leg out of it.  Pad placed in bed so leg could be straightened until new KI's were obtained as both were wet and needed to be replaced.  Pt allowed only minimal L knee flexion.  5-15 degrees in supine.  Education provided on importance of ROM but she stated she was limited by pain.  New KI's applied prior to transfer.  To edge of bed with min a x 2 and encouragement.  Overall did well with bed mobility today.  Once sitting she was able to sit without loss of balance.  Stood with min/mod a x 2 and transferred to recliner at bedside.  She was unable to ambulate further at this time.     Follow Up Recommendations  SNF     Equipment Recommendations  Rolling walker with 5" wheels    Recommendations for Other Services       Precautions / Restrictions Precautions Precautions: Fall;Knee Required Braces or Orthoses: Knee Immobilizer - Right;Knee Immobilizer - Left Knee Immobilizer - Right: On at all times Knee Immobilizer - Left: On when out of bed or walking;Discontinue once straight leg raise with < 10 degree lag Restrictions Weight Bearing Restrictions: No RLE Weight Bearing: Weight bearing as tolerated LLE Weight Bearing: Weight bearing as tolerated Other Position/Activity Restrictions: R LE  KI and WBAT; NO ROM R knee (per verbal discussion with MD Lisa Hawkins 10/27/16)    Mobility  Bed Mobility Overal bed mobility: Needs Assistance Bed Mobility: Supine to Sit     Supine to sit: Min assist;+2 for physical assistance     General bed mobility comments: encouragement needed.  initially stating she was unable but did well with encouragment  Transfers Overall transfer level: Needs assistance Equipment used: Rolling walker (2 wheeled) Transfers: Sit to/from Stand Sit to Stand: Mod assist;+2 physical assistance;Min assist         General transfer comment: improved today  Ambulation/Gait Ambulation/Gait assistance: +2 physical assistance;Min assist Ambulation Distance (Feet): 3 Feet Assistive device: Rolling walker (2 wheeled) Gait Pattern/deviations: Step-to pattern Gait velocity: decreased Gait velocity interpretation: <1.8 ft/sec, indicative of risk for recurrent falls General Gait Details: B KI's donned, overall did well but unable to ambulate further than chair   Stairs            Wheelchair Mobility    Modified Rankin (Stroke Patients Only)       Balance Overall balance assessment: Needs assistance Sitting-balance support: Feet supported Sitting balance-Lisa Hawkins Scale: Fair     Standing balance support: Bilateral upper extremity supported Standing balance-Lisa Hawkins Scale: Fair  Cognition Arousal/Alertness: Awake/alert Behavior During Therapy: Impulsive Overall Cognitive Status: No family/caregiver present to determine baseline cognitive functioning                      Exercises Total Joint Exercises Ankle Circles/Pumps: AROM;Strengthening;Both;10 reps;Supine Quad Sets: AROM;Strengthening;Left;10 reps;Supine Heel Slides: AAROM;Left;10 reps;Supine Straight Leg Raises: AAROM;Strengthening;Left;10 reps;Supine Goniometric ROM: 5-15    General Comments        Pertinent Vitals/Pain Pain Assessment: 0-10 Pain  Score: 8  Pain Location: L knee Pain Descriptors / Indicators: Aching;Operative site guarding;Sore Pain Intervention(s): Limited activity within patient's tolerance;Ice applied    Home Living                      Prior Function            PT Goals (current goals can now be found in the care plan section) Progress towards PT goals: Progressing toward goals    Frequency    BID      PT Plan Current plan remains appropriate    Co-evaluation             End of Session Equipment Utilized During Treatment: Right knee immobilizer;Left knee immobilizer Activity Tolerance: Patient limited by pain Patient left: in chair;with call bell/phone within reach;with chair alarm set     Time: [id number] PT Time Calculation (min) (ACUTE ONLY): 23 min  Charges:  $Gait Training: 8-22 mins $Therapeutic Exercise: 8-22 mins                    G Codes:      Chesley Noon 22-Nov-2016, 10:55 AM

## 2016-10-29 NOTE — Care Management Important Message (Signed)
Important Message  Patient Details  Name: EVANI BURDI MRN: [id number] Date of Birth: [birth year]   Medicare Important Message Given:  Yes    Jolly Mango, RN 10/29/2016, 8:54 AM

## 2016-11-02 DIAGNOSIS — F419 Anxiety disorder, unspecified: Secondary | ICD-10-CM | POA: Diagnosis not present

## 2016-11-02 DIAGNOSIS — B20 Human immunodeficiency virus [HIV] disease: Secondary | ICD-10-CM | POA: Diagnosis not present

## 2016-11-02 DIAGNOSIS — G629 Polyneuropathy, unspecified: Secondary | ICD-10-CM | POA: Diagnosis not present

## 2016-11-02 DIAGNOSIS — I10 Essential (primary) hypertension: Secondary | ICD-10-CM | POA: Diagnosis not present

## 2016-11-02 DIAGNOSIS — S82009A Unspecified fracture of unspecified patella, initial encounter for closed fracture: Secondary | ICD-10-CM | POA: Diagnosis not present

## 2016-11-02 LAB — CULTURE, BLOOD (ROUTINE X 2)
CULTURE: NO GROWTH
CULTURE: NO GROWTH

## 2016-11-09 DIAGNOSIS — M25569 Pain in unspecified knee: Secondary | ICD-10-CM | POA: Diagnosis not present

## 2016-11-09 DIAGNOSIS — I739 Peripheral vascular disease, unspecified: Secondary | ICD-10-CM | POA: Diagnosis not present

## 2016-11-09 DIAGNOSIS — B20 Human immunodeficiency virus [HIV] disease: Secondary | ICD-10-CM | POA: Diagnosis not present

## 2016-11-09 DIAGNOSIS — I10 Essential (primary) hypertension: Secondary | ICD-10-CM | POA: Diagnosis not present

## 2016-11-09 DIAGNOSIS — F419 Anxiety disorder, unspecified: Secondary | ICD-10-CM | POA: Diagnosis not present

## 2016-11-29 DIAGNOSIS — Z8614 Personal history of Methicillin resistant Staphylococcus aureus infection: Secondary | ICD-10-CM

## 2016-11-29 HISTORY — DX: Personal history of Methicillin resistant Staphylococcus aureus infection: Z86.14

## 2016-12-08 DIAGNOSIS — Z96652 Presence of left artificial knee joint: Secondary | ICD-10-CM | POA: Diagnosis not present

## 2016-12-09 DIAGNOSIS — S82001D Unspecified fracture of right patella, subsequent encounter for closed fracture with routine healing: Secondary | ICD-10-CM | POA: Diagnosis not present

## 2016-12-09 DIAGNOSIS — B20 Human immunodeficiency virus [HIV] disease: Secondary | ICD-10-CM | POA: Diagnosis not present

## 2016-12-09 DIAGNOSIS — G629 Polyneuropathy, unspecified: Secondary | ICD-10-CM | POA: Diagnosis not present

## 2016-12-09 DIAGNOSIS — F329 Major depressive disorder, single episode, unspecified: Secondary | ICD-10-CM | POA: Diagnosis not present

## 2016-12-09 DIAGNOSIS — Z471 Aftercare following joint replacement surgery: Secondary | ICD-10-CM | POA: Diagnosis not present

## 2016-12-09 DIAGNOSIS — F419 Anxiety disorder, unspecified: Secondary | ICD-10-CM | POA: Diagnosis not present

## 2016-12-09 DIAGNOSIS — I10 Essential (primary) hypertension: Secondary | ICD-10-CM | POA: Diagnosis not present

## 2016-12-09 DIAGNOSIS — M1991 Primary osteoarthritis, unspecified site: Secondary | ICD-10-CM | POA: Diagnosis not present

## 2016-12-09 DIAGNOSIS — G890 Central pain syndrome: Secondary | ICD-10-CM | POA: Diagnosis not present

## 2016-12-14 DIAGNOSIS — G890 Central pain syndrome: Secondary | ICD-10-CM | POA: Diagnosis not present

## 2016-12-14 DIAGNOSIS — F419 Anxiety disorder, unspecified: Secondary | ICD-10-CM | POA: Diagnosis not present

## 2016-12-14 DIAGNOSIS — B20 Human immunodeficiency virus [HIV] disease: Secondary | ICD-10-CM | POA: Diagnosis not present

## 2016-12-14 DIAGNOSIS — Z471 Aftercare following joint replacement surgery: Secondary | ICD-10-CM | POA: Diagnosis not present

## 2016-12-14 DIAGNOSIS — G629 Polyneuropathy, unspecified: Secondary | ICD-10-CM | POA: Diagnosis not present

## 2016-12-14 DIAGNOSIS — I10 Essential (primary) hypertension: Secondary | ICD-10-CM | POA: Diagnosis not present

## 2016-12-14 DIAGNOSIS — M1991 Primary osteoarthritis, unspecified site: Secondary | ICD-10-CM | POA: Diagnosis not present

## 2016-12-14 DIAGNOSIS — F329 Major depressive disorder, single episode, unspecified: Secondary | ICD-10-CM | POA: Diagnosis not present

## 2016-12-14 DIAGNOSIS — S82001D Unspecified fracture of right patella, subsequent encounter for closed fracture with routine healing: Secondary | ICD-10-CM | POA: Diagnosis not present

## 2016-12-17 DIAGNOSIS — Z471 Aftercare following joint replacement surgery: Secondary | ICD-10-CM | POA: Diagnosis not present

## 2016-12-17 DIAGNOSIS — I10 Essential (primary) hypertension: Secondary | ICD-10-CM | POA: Diagnosis not present

## 2016-12-17 DIAGNOSIS — F329 Major depressive disorder, single episode, unspecified: Secondary | ICD-10-CM | POA: Diagnosis not present

## 2016-12-17 DIAGNOSIS — G890 Central pain syndrome: Secondary | ICD-10-CM | POA: Diagnosis not present

## 2016-12-17 DIAGNOSIS — F419 Anxiety disorder, unspecified: Secondary | ICD-10-CM | POA: Diagnosis not present

## 2016-12-17 DIAGNOSIS — S82001D Unspecified fracture of right patella, subsequent encounter for closed fracture with routine healing: Secondary | ICD-10-CM | POA: Diagnosis not present

## 2016-12-17 DIAGNOSIS — G629 Polyneuropathy, unspecified: Secondary | ICD-10-CM | POA: Diagnosis not present

## 2016-12-17 DIAGNOSIS — B20 Human immunodeficiency virus [HIV] disease: Secondary | ICD-10-CM | POA: Diagnosis not present

## 2016-12-17 DIAGNOSIS — M1991 Primary osteoarthritis, unspecified site: Secondary | ICD-10-CM | POA: Diagnosis not present

## 2016-12-20 DIAGNOSIS — F419 Anxiety disorder, unspecified: Secondary | ICD-10-CM | POA: Diagnosis not present

## 2016-12-20 DIAGNOSIS — S82001D Unspecified fracture of right patella, subsequent encounter for closed fracture with routine healing: Secondary | ICD-10-CM | POA: Diagnosis not present

## 2016-12-20 DIAGNOSIS — F329 Major depressive disorder, single episode, unspecified: Secondary | ICD-10-CM | POA: Diagnosis not present

## 2016-12-20 DIAGNOSIS — I10 Essential (primary) hypertension: Secondary | ICD-10-CM | POA: Diagnosis not present

## 2016-12-20 DIAGNOSIS — Z471 Aftercare following joint replacement surgery: Secondary | ICD-10-CM | POA: Diagnosis not present

## 2016-12-20 DIAGNOSIS — M1991 Primary osteoarthritis, unspecified site: Secondary | ICD-10-CM | POA: Diagnosis not present

## 2016-12-20 DIAGNOSIS — G629 Polyneuropathy, unspecified: Secondary | ICD-10-CM | POA: Diagnosis not present

## 2016-12-20 DIAGNOSIS — B20 Human immunodeficiency virus [HIV] disease: Secondary | ICD-10-CM | POA: Diagnosis not present

## 2016-12-20 DIAGNOSIS — G890 Central pain syndrome: Secondary | ICD-10-CM | POA: Diagnosis not present

## 2016-12-21 DIAGNOSIS — G629 Polyneuropathy, unspecified: Secondary | ICD-10-CM | POA: Diagnosis not present

## 2016-12-21 DIAGNOSIS — F329 Major depressive disorder, single episode, unspecified: Secondary | ICD-10-CM | POA: Diagnosis not present

## 2016-12-21 DIAGNOSIS — S82001D Unspecified fracture of right patella, subsequent encounter for closed fracture with routine healing: Secondary | ICD-10-CM | POA: Diagnosis not present

## 2016-12-21 DIAGNOSIS — G890 Central pain syndrome: Secondary | ICD-10-CM | POA: Diagnosis not present

## 2016-12-21 DIAGNOSIS — Z471 Aftercare following joint replacement surgery: Secondary | ICD-10-CM | POA: Diagnosis not present

## 2016-12-21 DIAGNOSIS — I10 Essential (primary) hypertension: Secondary | ICD-10-CM | POA: Diagnosis not present

## 2016-12-21 DIAGNOSIS — B20 Human immunodeficiency virus [HIV] disease: Secondary | ICD-10-CM | POA: Diagnosis not present

## 2016-12-21 DIAGNOSIS — F419 Anxiety disorder, unspecified: Secondary | ICD-10-CM | POA: Diagnosis not present

## 2016-12-21 DIAGNOSIS — M1991 Primary osteoarthritis, unspecified site: Secondary | ICD-10-CM | POA: Diagnosis not present

## 2016-12-22 DIAGNOSIS — G629 Polyneuropathy, unspecified: Secondary | ICD-10-CM | POA: Diagnosis not present

## 2016-12-22 DIAGNOSIS — B20 Human immunodeficiency virus [HIV] disease: Secondary | ICD-10-CM | POA: Diagnosis not present

## 2016-12-22 DIAGNOSIS — F419 Anxiety disorder, unspecified: Secondary | ICD-10-CM | POA: Diagnosis not present

## 2016-12-22 DIAGNOSIS — F329 Major depressive disorder, single episode, unspecified: Secondary | ICD-10-CM | POA: Diagnosis not present

## 2016-12-22 DIAGNOSIS — M1991 Primary osteoarthritis, unspecified site: Secondary | ICD-10-CM | POA: Diagnosis not present

## 2016-12-22 DIAGNOSIS — G890 Central pain syndrome: Secondary | ICD-10-CM | POA: Diagnosis not present

## 2016-12-22 DIAGNOSIS — I10 Essential (primary) hypertension: Secondary | ICD-10-CM | POA: Diagnosis not present

## 2016-12-22 DIAGNOSIS — S82001D Unspecified fracture of right patella, subsequent encounter for closed fracture with routine healing: Secondary | ICD-10-CM | POA: Diagnosis not present

## 2016-12-22 DIAGNOSIS — Z471 Aftercare following joint replacement surgery: Secondary | ICD-10-CM | POA: Diagnosis not present

## 2016-12-24 DIAGNOSIS — S82001D Unspecified fracture of right patella, subsequent encounter for closed fracture with routine healing: Secondary | ICD-10-CM | POA: Diagnosis not present

## 2016-12-24 DIAGNOSIS — F329 Major depressive disorder, single episode, unspecified: Secondary | ICD-10-CM | POA: Diagnosis not present

## 2016-12-24 DIAGNOSIS — G890 Central pain syndrome: Secondary | ICD-10-CM | POA: Diagnosis not present

## 2016-12-24 DIAGNOSIS — M1991 Primary osteoarthritis, unspecified site: Secondary | ICD-10-CM | POA: Diagnosis not present

## 2016-12-24 DIAGNOSIS — G629 Polyneuropathy, unspecified: Secondary | ICD-10-CM | POA: Diagnosis not present

## 2016-12-24 DIAGNOSIS — B20 Human immunodeficiency virus [HIV] disease: Secondary | ICD-10-CM | POA: Diagnosis not present

## 2016-12-24 DIAGNOSIS — F419 Anxiety disorder, unspecified: Secondary | ICD-10-CM | POA: Diagnosis not present

## 2016-12-24 DIAGNOSIS — I10 Essential (primary) hypertension: Secondary | ICD-10-CM | POA: Diagnosis not present

## 2016-12-24 DIAGNOSIS — Z471 Aftercare following joint replacement surgery: Secondary | ICD-10-CM | POA: Diagnosis not present

## 2016-12-28 DIAGNOSIS — F329 Major depressive disorder, single episode, unspecified: Secondary | ICD-10-CM | POA: Diagnosis not present

## 2016-12-28 DIAGNOSIS — G890 Central pain syndrome: Secondary | ICD-10-CM | POA: Diagnosis not present

## 2016-12-28 DIAGNOSIS — I10 Essential (primary) hypertension: Secondary | ICD-10-CM | POA: Diagnosis not present

## 2016-12-28 DIAGNOSIS — G629 Polyneuropathy, unspecified: Secondary | ICD-10-CM | POA: Diagnosis not present

## 2016-12-28 DIAGNOSIS — S82001D Unspecified fracture of right patella, subsequent encounter for closed fracture with routine healing: Secondary | ICD-10-CM | POA: Diagnosis not present

## 2016-12-28 DIAGNOSIS — F419 Anxiety disorder, unspecified: Secondary | ICD-10-CM | POA: Diagnosis not present

## 2016-12-28 DIAGNOSIS — Z471 Aftercare following joint replacement surgery: Secondary | ICD-10-CM | POA: Diagnosis not present

## 2016-12-28 DIAGNOSIS — M1991 Primary osteoarthritis, unspecified site: Secondary | ICD-10-CM | POA: Diagnosis not present

## 2016-12-28 DIAGNOSIS — B20 Human immunodeficiency virus [HIV] disease: Secondary | ICD-10-CM | POA: Diagnosis not present

## 2016-12-29 ENCOUNTER — Encounter
Admission: RE | Admit: 2016-12-29 | Discharge: 2016-12-29 | Disposition: A | Discharge status: Home / Self Care | Payer: Medicare HMO | Source: Ambulatory Visit | Attending: Orthopedic Surgery | Admitting: Orthopedic Surgery

## 2016-12-29 ENCOUNTER — Inpatient Hospital Stay: Admission: RE | Admit: 2016-12-29 | Payer: Self-pay | Source: Ambulatory Visit

## 2016-12-29 DIAGNOSIS — B20 Human immunodeficiency virus [HIV] disease: Secondary | ICD-10-CM | POA: Diagnosis not present

## 2016-12-29 DIAGNOSIS — F329 Major depressive disorder, single episode, unspecified: Secondary | ICD-10-CM | POA: Diagnosis not present

## 2016-12-29 DIAGNOSIS — M1991 Primary osteoarthritis, unspecified site: Secondary | ICD-10-CM | POA: Diagnosis not present

## 2016-12-29 DIAGNOSIS — F419 Anxiety disorder, unspecified: Secondary | ICD-10-CM | POA: Diagnosis not present

## 2016-12-29 DIAGNOSIS — I10 Essential (primary) hypertension: Secondary | ICD-10-CM | POA: Diagnosis not present

## 2016-12-29 DIAGNOSIS — G890 Central pain syndrome: Secondary | ICD-10-CM | POA: Diagnosis not present

## 2016-12-29 DIAGNOSIS — G629 Polyneuropathy, unspecified: Secondary | ICD-10-CM | POA: Diagnosis not present

## 2016-12-29 DIAGNOSIS — Z471 Aftercare following joint replacement surgery: Secondary | ICD-10-CM | POA: Diagnosis not present

## 2016-12-29 DIAGNOSIS — S82001D Unspecified fracture of right patella, subsequent encounter for closed fracture with routine healing: Secondary | ICD-10-CM | POA: Diagnosis not present

## 2016-12-29 HISTORY — DX: Cardiac arrhythmia, unspecified: I49.9

## 2016-12-29 NOTE — Patient Instructions (Addendum)
  Your procedure is scheduled on:12/30/16 Report to Day Surgery. [facility]OND FLOOR To find out your arrival time please call [phone] between 1PM - 3PM on  12/29/16  Remember: Instructions that are not followed completely may result in serious medical risk, up to and including death, or upon the discretion of your surgeon and anesthesiologist your surgery may need to be rescheduled.    __X__ 1. Do not eat food or drink liquids after midnight. No gum chewing or hard candies.     ____ 2. No Alcohol for 24 hours before or after surgery.   __X__ 3. Do Not Smoke For 24 Hours Prior to Your Surgery.   ____ 4. Bring all medications with you on the day of surgery if instructed.    _X___ 5. Notify your doctor if there is any change in your medical condition     (cold, fever, infections).       Do not wear jewelry, make-up, hairpins, clips or nail polish.  Do not wear lotions, powders, or perfumes. You may wear deodorant.  Do not shave 48 hours prior to surgery. Men may shave face and neck.  Do not bring valuables to the hospital.    [facility] is not responsible for any belongings or valuables.               Contacts, dentures or bridgework may not be worn into surgery.  Leave your suitcase in the car. After surgery it may be brought to your room.  For patients admitted to the hospital, discharge time is determined by your                treatment team.   Patients discharged the day of surgery will not be allowed to drive home.   _X___ Take these medicines the morning of surgery with A SIP OF WATER:    1. AMITRIPTYLINE  2. GABAPENTIN  3.   4.  5.  6.  ____ Fleet Enema (as directed)   ____ Use CHG Soap as directed  ____ Use inhalers on the day of surgery  ____ Stop metformin 2 days prior to surgery    ____ Take 1/2 of usual insulin dose the night before surgery and none on the morning of surgery.   _X___ Stop Coumadin/Plavix/aspirin on    ASPIRIN ALREADY  STOPPED  ____ Stop Anti-inflammatories on   ____ Stop supplements until after surgery.    ____ Bring C-Pap to the hospital. 342

## 2016-12-30 ENCOUNTER — Encounter: Payer: Self-pay | Admitting: *Deleted

## 2016-12-30 ENCOUNTER — Ambulatory Visit
Admission: RE | Admit: 2016-12-30 | Discharge: 2016-12-30 | Disposition: A | Discharge status: Home / Self Care | Payer: Medicare HMO | Source: Ambulatory Visit | Attending: Orthopedic Surgery | Admitting: Orthopedic Surgery

## 2016-12-30 ENCOUNTER — Encounter
Admission: RE | Disposition: A | Discharge status: Home / Self Care | Payer: Self-pay | Source: Ambulatory Visit | Attending: Orthopedic Surgery

## 2016-12-30 ENCOUNTER — Ambulatory Visit: Payer: Medicare HMO | Admitting: Anesthesiology

## 2016-12-30 DIAGNOSIS — Z471 Aftercare following joint replacement surgery: Secondary | ICD-10-CM | POA: Insufficient documentation

## 2016-12-30 DIAGNOSIS — Z96653 Presence of artificial knee joint, bilateral: Secondary | ICD-10-CM | POA: Diagnosis not present

## 2016-12-30 DIAGNOSIS — F17210 Nicotine dependence, cigarettes, uncomplicated: Secondary | ICD-10-CM | POA: Diagnosis not present

## 2016-12-30 DIAGNOSIS — Z79899 Other long term (current) drug therapy: Secondary | ICD-10-CM | POA: Insufficient documentation

## 2016-12-30 DIAGNOSIS — M1990 Unspecified osteoarthritis, unspecified site: Secondary | ICD-10-CM | POA: Insufficient documentation

## 2016-12-30 DIAGNOSIS — F419 Anxiety disorder, unspecified: Secondary | ICD-10-CM | POA: Insufficient documentation

## 2016-12-30 DIAGNOSIS — Z96652 Presence of left artificial knee joint: Secondary | ICD-10-CM | POA: Diagnosis not present

## 2016-12-30 DIAGNOSIS — Z21 Asymptomatic human immunodeficiency virus [HIV] infection status: Secondary | ICD-10-CM | POA: Diagnosis not present

## 2016-12-30 DIAGNOSIS — B1920 Unspecified viral hepatitis C without hepatic coma: Secondary | ICD-10-CM | POA: Diagnosis not present

## 2016-12-30 DIAGNOSIS — Z01818 Encounter for other preprocedural examination: Secondary | ICD-10-CM | POA: Insufficient documentation

## 2016-12-30 DIAGNOSIS — Z880 Allergy status to penicillin: Secondary | ICD-10-CM | POA: Insufficient documentation

## 2016-12-30 DIAGNOSIS — M24662 Ankylosis, left knee: Secondary | ICD-10-CM | POA: Diagnosis not present

## 2016-12-30 DIAGNOSIS — I10 Essential (primary) hypertension: Secondary | ICD-10-CM | POA: Diagnosis not present

## 2016-12-30 DIAGNOSIS — M1712 Unilateral primary osteoarthritis, left knee: Secondary | ICD-10-CM | POA: Diagnosis not present

## 2016-12-30 HISTORY — PX: KNEE CLOSED REDUCTION: SHX995

## 2016-12-30 LAB — SURGICAL PCR SCREEN
MRSA, PCR: POSITIVE — AB
STAPHYLOCOCCUS AUREUS: POSITIVE — AB

## 2016-12-30 SURGERY — MANIPULATION, KNEE, CLOSED
Anesthesia: General | Laterality: Left

## 2016-12-30 MED ORDER — OXYCODONE HCL 5 MG PO TABS
ORAL_TABLET | ORAL | Status: AC
  Filled 2016-12-30: qty 1

## 2016-12-30 MED ORDER — MIDAZOLAM HCL 2 MG/2ML IJ SOLN
INTRAMUSCULAR | Status: AC
  Filled 2016-12-30: qty 2

## 2016-12-30 MED ORDER — ONDANSETRON HCL 4 MG/2ML IJ SOLN: 4.0000 mg | Freq: Once | INTRAMUSCULAR | Status: DC | PRN

## 2016-12-30 MED ORDER — LACTATED RINGERS IV SOLN
INTRAVENOUS | Status: DC
  Administered 2016-12-30: 11:00:00 via INTRAVENOUS

## 2016-12-30 MED ORDER — SUCCINYLCHOLINE CHLORIDE 20 MG/ML IJ SOLN
INTRAMUSCULAR | Status: DC | PRN
  Administered 2016-12-30: 40 mg via INTRAVENOUS

## 2016-12-30 MED ORDER — FAMOTIDINE 20 MG PO TABS
ORAL_TABLET | ORAL | Status: AC
  Filled 2016-12-30: qty 1

## 2016-12-30 MED ORDER — MIDAZOLAM HCL 2 MG/2ML IJ SOLN
INTRAMUSCULAR | Status: DC | PRN
  Administered 2016-12-30: 2 mg via INTRAVENOUS

## 2016-12-30 MED ORDER — FENTANYL CITRATE (PF) 100 MCG/2ML IJ SOLN
INTRAMUSCULAR | Status: DC | PRN
  Administered 2016-12-30: 50 ug via INTRAVENOUS

## 2016-12-30 MED ORDER — PROPOFOL 500 MG/50ML IV EMUL
INTRAVENOUS | Status: DC | PRN
  Administered 2016-12-30: 30 ug/kg/min via INTRAVENOUS

## 2016-12-30 MED ORDER — FENTANYL CITRATE (PF) 100 MCG/2ML IJ SOLN
INTRAMUSCULAR | Status: AC
  Filled 2016-12-30: qty 2

## 2016-12-30 MED ORDER — FAMOTIDINE 20 MG PO TABS
20.0000 mg | ORAL_TABLET | Freq: Once | ORAL | Status: AC
  Administered 2016-12-30: 20 mg via ORAL

## 2016-12-30 MED ORDER — OXYCODONE HCL 5 MG PO TABS
5.0000 mg | ORAL_TABLET | Freq: Once | ORAL | Status: AC
  Administered 2016-12-30: 5 mg via ORAL
  Filled 2016-12-30: qty 1

## 2016-12-30 MED ORDER — PROPOFOL 10 MG/ML IV BOLUS
INTRAVENOUS | Status: AC
  Filled 2016-12-30: qty 20

## 2016-12-30 MED ORDER — FENTANYL CITRATE (PF) 100 MCG/2ML IJ SOLN: 25.0000 ug | INTRAMUSCULAR | Status: DC | PRN

## 2016-12-30 SURGICAL SUPPLY — 1 items: IMMOB KNEE 14 THIGH 24 706614 (SOFTGOODS) ×3 IMPLANT

## 2016-12-30 NOTE — H&P (Signed)
Reviewed paper H+P, will be scanned into chart. No changes noted.  

## 2016-12-30 NOTE — Anesthesia Preprocedure Evaluation (Signed)
Anesthesia Evaluation  Patient identified by MRN, date of birth, ID band Patient awake    Reviewed: Allergy & Precautions, NPO status , Patient's Chart, lab work & pertinent test results, reviewed documented beta blocker date and time   History of Anesthesia Complications (+) PONV and history of anesthetic complications  Airway Mallampati: III  TM Distance: >3 FB     Dental  (+) Chipped   Pulmonary shortness of breath, Current Smoker,           Cardiovascular hypertension,      Neuro/Psych Anxiety    GI/Hepatic GERD  ,(+) Hepatitis -  Endo/Other  Morbid obesity  Renal/GU      Musculoskeletal  (+) Arthritis ,   Abdominal   Peds  Hematology   Anesthesia Other Findings Hx of acute resp failure.  Reproductive/Obstetrics                             Anesthesia Physical Anesthesia Plan  ASA: III  Anesthesia Plan: General   Post-op Pain Management:    Induction: Intravenous  Airway Management Planned: LMA  Additional Equipment:   Intra-op Plan:   Post-operative Plan:   Informed Consent: I have reviewed the patients History and Physical, chart, labs and discussed the procedure including the risks, benefits and alternatives for the proposed anesthesia with the patient or authorized representative who has indicated his/her understanding and acceptance.     Plan Discussed with: CRNA  Anesthesia Plan Comments:         Anesthesia Quick Evaluation

## 2016-12-30 NOTE — Discharge Instructions (Signed)
Keep knee immobilizer on today and tonight to try to keep the leg on a straight as possible with pillow under the foot to try to get the leg straight. Tomorrow remove the immobilizer and work with physical therapy to maintain obtained range of motion. Pain medicine as previously prescribed earlier this week

## 2016-12-30 NOTE — Op Note (Signed)
12/30/2016  11:47 AM  PATIENT:  Lisa Hawkins  66 y.o. female  PRE-OPERATIVE DIAGNOSIS:  STATUS POST LEFT TOTAL KNEE REPLACEMENT  POST-OPERATIVE DIAGNOSIS:  * No post-op diagnosis entered *arthrofibrosis left total knee  PROCEDURE:  Procedure(s): CLOSED MANIPULATION KNEE (Left)  SURGEON: Laurene Footman, MD  ASSISTANTS: None  ANESTHESIA:   general  EBL:  No intake/output data recorded.  BLOOD ADMINISTERED:none  DRAINS: none   LOCAL MEDICATIONS USED:  NONE  SPECIMEN:  No Specimen  DISPOSITION OF SPECIMEN:  N/A  COUNTS:  NO Closed procedure no counts required  TOURNIQUET:  * No tourniquets in log *  IMPLANTS: None  DICTATION: .Dragon Dictation patient was brought the operating room and after adequate anesthesia was obtained appropriate patient identification and timeout procedure completed. The additionally there is a proximally 20-25 flexion contracture and bring the knee up and flexion knee was brought to a proximally 95 with gentle pressure knee can be flexed all her back to 120 and with pressure on the anterior knee with a pillow underneath the foot essentially full extension was obtained with her show the patient later that her range of motion was much improved reviewed with her extension being the main problem a short knee immobilizer was applied to try to maintain full extension today and tonight  PLAN OF CARE: Discharge to home after PACU  PATIENT DISPOSITION:  PACU - hemodynamically stable.

## 2016-12-30 NOTE — Anesthesia Procedure Notes (Signed)
Performed by: Vilas Edgerly Pre-anesthesia Checklist: Patient identified, Emergency Drugs available, Suction available, Patient being monitored and Timeout performed Patient Re-evaluated:Patient Re-evaluated prior to inductionOxygen Delivery Method: Nasal cannula Preoxygenation: Pre-oxygenation with 100% oxygen Intubation Type: IV induction       

## 2016-12-30 NOTE — Pre-Procedure Instructions (Signed)
DEBBIE IN SDS NOTIFIED MRSA/STAPH POSITIVE

## 2016-12-30 NOTE — Anesthesia Postprocedure Evaluation (Signed)
Anesthesia Post Note  Patient: Lisa Hawkins  Procedure(s) Performed: Procedure(s) (LRB): CLOSED MANIPULATION KNEE (Left)  Patient location during evaluation: PACU Anesthesia Type: General Level of consciousness: awake and alert Pain management: pain level controlled Vital Signs Assessment: post-procedure vital signs reviewed and stable Respiratory status: spontaneous breathing, nonlabored ventilation, respiratory function stable and patient connected to nasal cannula oxygen Cardiovascular status: blood pressure returned to baseline and stable Postop Assessment: no signs of nausea or vomiting Anesthetic complications: no     Last Vitals:  Vitals:   12/30/16 1224 12/30/16 1245  BP: (!) 146/78 119/82  Pulse: 93 95  Resp: (!) 23   Temp: 36.7 C     Last Pain:  Vitals:   12/30/16 1154  TempSrc:   PainSc: [place] S

## 2016-12-30 NOTE — Anesthesia Post-op Follow-up Note (Cosign Needed)
Anesthesia QCDR form completed.        

## 2016-12-30 NOTE — Transfer of Care (Signed)
Immediate Anesthesia Transfer of Care Note  Patient: Lisa Hawkins  Procedure(s) Performed: Procedure(s): CLOSED MANIPULATION KNEE (Left)  Patient Location: PACU  Anesthesia Type:MAC  Level of Consciousness: awake, alert  and oriented  Airway & Oxygen Therapy: Patient Spontanous Breathing and Patient connected to nasal cannula oxygen  Post-op Assessment: Report given to RN and Post -op Vital signs reviewed and stable  Post vital signs: Reviewed and stable  Last Vitals:  Vitals:   12/30/16 1035 12/30/16 1154  BP: 98/77 140/81  Pulse: (!) 110 96  Resp: 18 15  Temp: 36.4 C 36.7 C    Last Pain:  Vitals:   12/30/16 1035  TempSrc: Tympanic  PainSc: 5          Complications: No apparent anesthesia complications

## 2016-12-31 DIAGNOSIS — F329 Major depressive disorder, single episode, unspecified: Secondary | ICD-10-CM | POA: Diagnosis not present

## 2016-12-31 DIAGNOSIS — Z471 Aftercare following joint replacement surgery: Secondary | ICD-10-CM | POA: Diagnosis not present

## 2016-12-31 DIAGNOSIS — G890 Central pain syndrome: Secondary | ICD-10-CM | POA: Diagnosis not present

## 2016-12-31 DIAGNOSIS — F419 Anxiety disorder, unspecified: Secondary | ICD-10-CM | POA: Diagnosis not present

## 2016-12-31 DIAGNOSIS — M1991 Primary osteoarthritis, unspecified site: Secondary | ICD-10-CM | POA: Diagnosis not present

## 2016-12-31 DIAGNOSIS — S82001D Unspecified fracture of right patella, subsequent encounter for closed fracture with routine healing: Secondary | ICD-10-CM | POA: Diagnosis not present

## 2016-12-31 DIAGNOSIS — M1712 Unilateral primary osteoarthritis, left knee: Secondary | ICD-10-CM | POA: Diagnosis not present

## 2016-12-31 DIAGNOSIS — M24662 Ankylosis, left knee: Secondary | ICD-10-CM | POA: Diagnosis not present

## 2016-12-31 DIAGNOSIS — I10 Essential (primary) hypertension: Secondary | ICD-10-CM | POA: Diagnosis not present

## 2016-12-31 DIAGNOSIS — Z96652 Presence of left artificial knee joint: Secondary | ICD-10-CM | POA: Diagnosis not present

## 2016-12-31 DIAGNOSIS — B20 Human immunodeficiency virus [HIV] disease: Secondary | ICD-10-CM | POA: Diagnosis not present

## 2016-12-31 DIAGNOSIS — G629 Polyneuropathy, unspecified: Secondary | ICD-10-CM | POA: Diagnosis not present

## 2017-01-04 DIAGNOSIS — Z471 Aftercare following joint replacement surgery: Secondary | ICD-10-CM | POA: Diagnosis not present

## 2017-01-04 DIAGNOSIS — F419 Anxiety disorder, unspecified: Secondary | ICD-10-CM | POA: Diagnosis not present

## 2017-01-04 DIAGNOSIS — G629 Polyneuropathy, unspecified: Secondary | ICD-10-CM | POA: Diagnosis not present

## 2017-01-04 DIAGNOSIS — F329 Major depressive disorder, single episode, unspecified: Secondary | ICD-10-CM | POA: Diagnosis not present

## 2017-01-04 DIAGNOSIS — G890 Central pain syndrome: Secondary | ICD-10-CM | POA: Diagnosis not present

## 2017-01-04 DIAGNOSIS — M1991 Primary osteoarthritis, unspecified site: Secondary | ICD-10-CM | POA: Diagnosis not present

## 2017-01-04 DIAGNOSIS — I10 Essential (primary) hypertension: Secondary | ICD-10-CM | POA: Diagnosis not present

## 2017-01-04 DIAGNOSIS — S82001D Unspecified fracture of right patella, subsequent encounter for closed fracture with routine healing: Secondary | ICD-10-CM | POA: Diagnosis not present

## 2017-01-04 DIAGNOSIS — B20 Human immunodeficiency virus [HIV] disease: Secondary | ICD-10-CM | POA: Diagnosis not present

## 2017-01-06 DIAGNOSIS — B20 Human immunodeficiency virus [HIV] disease: Secondary | ICD-10-CM | POA: Diagnosis not present

## 2017-01-06 DIAGNOSIS — G890 Central pain syndrome: Secondary | ICD-10-CM | POA: Diagnosis not present

## 2017-01-06 DIAGNOSIS — M1991 Primary osteoarthritis, unspecified site: Secondary | ICD-10-CM | POA: Diagnosis not present

## 2017-01-06 DIAGNOSIS — Z471 Aftercare following joint replacement surgery: Secondary | ICD-10-CM | POA: Diagnosis not present

## 2017-01-06 DIAGNOSIS — S82001D Unspecified fracture of right patella, subsequent encounter for closed fracture with routine healing: Secondary | ICD-10-CM | POA: Diagnosis not present

## 2017-01-06 DIAGNOSIS — F419 Anxiety disorder, unspecified: Secondary | ICD-10-CM | POA: Diagnosis not present

## 2017-01-06 DIAGNOSIS — F329 Major depressive disorder, single episode, unspecified: Secondary | ICD-10-CM | POA: Diagnosis not present

## 2017-01-06 DIAGNOSIS — I10 Essential (primary) hypertension: Secondary | ICD-10-CM | POA: Diagnosis not present

## 2017-01-06 DIAGNOSIS — G629 Polyneuropathy, unspecified: Secondary | ICD-10-CM | POA: Diagnosis not present

## 2017-01-07 DIAGNOSIS — M1991 Primary osteoarthritis, unspecified site: Secondary | ICD-10-CM | POA: Diagnosis not present

## 2017-01-07 DIAGNOSIS — F329 Major depressive disorder, single episode, unspecified: Secondary | ICD-10-CM | POA: Diagnosis not present

## 2017-01-07 DIAGNOSIS — G890 Central pain syndrome: Secondary | ICD-10-CM | POA: Diagnosis not present

## 2017-01-07 DIAGNOSIS — G629 Polyneuropathy, unspecified: Secondary | ICD-10-CM | POA: Diagnosis not present

## 2017-01-07 DIAGNOSIS — S82001D Unspecified fracture of right patella, subsequent encounter for closed fracture with routine healing: Secondary | ICD-10-CM | POA: Diagnosis not present

## 2017-01-07 DIAGNOSIS — F419 Anxiety disorder, unspecified: Secondary | ICD-10-CM | POA: Diagnosis not present

## 2017-01-07 DIAGNOSIS — I10 Essential (primary) hypertension: Secondary | ICD-10-CM | POA: Diagnosis not present

## 2017-01-07 DIAGNOSIS — B20 Human immunodeficiency virus [HIV] disease: Secondary | ICD-10-CM | POA: Diagnosis not present

## 2017-01-07 DIAGNOSIS — Z471 Aftercare following joint replacement surgery: Secondary | ICD-10-CM | POA: Diagnosis not present

## 2017-01-13 DIAGNOSIS — G629 Polyneuropathy, unspecified: Secondary | ICD-10-CM | POA: Diagnosis not present

## 2017-01-13 DIAGNOSIS — B20 Human immunodeficiency virus [HIV] disease: Secondary | ICD-10-CM | POA: Diagnosis not present

## 2017-01-13 DIAGNOSIS — F329 Major depressive disorder, single episode, unspecified: Secondary | ICD-10-CM | POA: Diagnosis not present

## 2017-01-13 DIAGNOSIS — F419 Anxiety disorder, unspecified: Secondary | ICD-10-CM | POA: Diagnosis not present

## 2017-01-13 DIAGNOSIS — M1991 Primary osteoarthritis, unspecified site: Secondary | ICD-10-CM | POA: Diagnosis not present

## 2017-01-13 DIAGNOSIS — G890 Central pain syndrome: Secondary | ICD-10-CM | POA: Diagnosis not present

## 2017-01-13 DIAGNOSIS — S82001D Unspecified fracture of right patella, subsequent encounter for closed fracture with routine healing: Secondary | ICD-10-CM | POA: Diagnosis not present

## 2017-01-13 DIAGNOSIS — Z471 Aftercare following joint replacement surgery: Secondary | ICD-10-CM | POA: Diagnosis not present

## 2017-01-13 DIAGNOSIS — I10 Essential (primary) hypertension: Secondary | ICD-10-CM | POA: Diagnosis not present

## 2017-01-17 DIAGNOSIS — F419 Anxiety disorder, unspecified: Secondary | ICD-10-CM | POA: Diagnosis not present

## 2017-01-17 DIAGNOSIS — G629 Polyneuropathy, unspecified: Secondary | ICD-10-CM | POA: Diagnosis not present

## 2017-01-17 DIAGNOSIS — S82001D Unspecified fracture of right patella, subsequent encounter for closed fracture with routine healing: Secondary | ICD-10-CM | POA: Diagnosis not present

## 2017-01-17 DIAGNOSIS — M1991 Primary osteoarthritis, unspecified site: Secondary | ICD-10-CM | POA: Diagnosis not present

## 2017-01-17 DIAGNOSIS — B20 Human immunodeficiency virus [HIV] disease: Secondary | ICD-10-CM | POA: Diagnosis not present

## 2017-01-17 DIAGNOSIS — Z471 Aftercare following joint replacement surgery: Secondary | ICD-10-CM | POA: Diagnosis not present

## 2017-01-17 DIAGNOSIS — G890 Central pain syndrome: Secondary | ICD-10-CM | POA: Diagnosis not present

## 2017-01-17 DIAGNOSIS — I10 Essential (primary) hypertension: Secondary | ICD-10-CM | POA: Diagnosis not present

## 2017-01-17 DIAGNOSIS — F329 Major depressive disorder, single episode, unspecified: Secondary | ICD-10-CM | POA: Diagnosis not present

## 2017-01-20 DIAGNOSIS — G629 Polyneuropathy, unspecified: Secondary | ICD-10-CM | POA: Diagnosis not present

## 2017-01-20 DIAGNOSIS — G890 Central pain syndrome: Secondary | ICD-10-CM | POA: Diagnosis not present

## 2017-01-20 DIAGNOSIS — F329 Major depressive disorder, single episode, unspecified: Secondary | ICD-10-CM | POA: Diagnosis not present

## 2017-01-20 DIAGNOSIS — I10 Essential (primary) hypertension: Secondary | ICD-10-CM | POA: Diagnosis not present

## 2017-01-20 DIAGNOSIS — S82001D Unspecified fracture of right patella, subsequent encounter for closed fracture with routine healing: Secondary | ICD-10-CM | POA: Diagnosis not present

## 2017-01-20 DIAGNOSIS — Z471 Aftercare following joint replacement surgery: Secondary | ICD-10-CM | POA: Diagnosis not present

## 2017-01-20 DIAGNOSIS — B20 Human immunodeficiency virus [HIV] disease: Secondary | ICD-10-CM | POA: Diagnosis not present

## 2017-01-20 DIAGNOSIS — F419 Anxiety disorder, unspecified: Secondary | ICD-10-CM | POA: Diagnosis not present

## 2017-01-20 DIAGNOSIS — M1991 Primary osteoarthritis, unspecified site: Secondary | ICD-10-CM | POA: Diagnosis not present

## 2017-01-24 DIAGNOSIS — S82001D Unspecified fracture of right patella, subsequent encounter for closed fracture with routine healing: Secondary | ICD-10-CM | POA: Diagnosis not present

## 2017-01-24 DIAGNOSIS — M1991 Primary osteoarthritis, unspecified site: Secondary | ICD-10-CM | POA: Diagnosis not present

## 2017-01-24 DIAGNOSIS — F329 Major depressive disorder, single episode, unspecified: Secondary | ICD-10-CM | POA: Diagnosis not present

## 2017-01-24 DIAGNOSIS — I10 Essential (primary) hypertension: Secondary | ICD-10-CM | POA: Diagnosis not present

## 2017-01-24 DIAGNOSIS — B20 Human immunodeficiency virus [HIV] disease: Secondary | ICD-10-CM | POA: Diagnosis not present

## 2017-01-24 DIAGNOSIS — F419 Anxiety disorder, unspecified: Secondary | ICD-10-CM | POA: Diagnosis not present

## 2017-01-24 DIAGNOSIS — G890 Central pain syndrome: Secondary | ICD-10-CM | POA: Diagnosis not present

## 2017-01-24 DIAGNOSIS — Z471 Aftercare following joint replacement surgery: Secondary | ICD-10-CM | POA: Diagnosis not present

## 2017-01-24 DIAGNOSIS — G629 Polyneuropathy, unspecified: Secondary | ICD-10-CM | POA: Diagnosis not present

## 2017-01-25 DIAGNOSIS — M65342 Trigger finger, left ring finger: Secondary | ICD-10-CM | POA: Diagnosis not present

## 2017-01-25 DIAGNOSIS — G8929 Other chronic pain: Secondary | ICD-10-CM | POA: Diagnosis not present

## 2017-01-25 DIAGNOSIS — Z0000 Encounter for general adult medical examination without abnormal findings: Secondary | ICD-10-CM | POA: Diagnosis not present

## 2017-01-25 DIAGNOSIS — M170 Bilateral primary osteoarthritis of knee: Secondary | ICD-10-CM | POA: Diagnosis not present

## 2017-01-25 DIAGNOSIS — I10 Essential (primary) hypertension: Secondary | ICD-10-CM | POA: Diagnosis not present

## 2017-01-25 DIAGNOSIS — Z23 Encounter for immunization: Secondary | ICD-10-CM | POA: Diagnosis not present

## 2017-01-25 DIAGNOSIS — M25561 Pain in right knee: Secondary | ICD-10-CM | POA: Diagnosis not present

## 2017-01-25 DIAGNOSIS — M5136 Other intervertebral disc degeneration, lumbar region: Secondary | ICD-10-CM | POA: Diagnosis not present

## 2017-01-25 DIAGNOSIS — M25562 Pain in left knee: Secondary | ICD-10-CM | POA: Diagnosis not present

## 2017-01-25 DIAGNOSIS — B182 Chronic viral hepatitis C: Secondary | ICD-10-CM | POA: Diagnosis not present

## 2017-01-25 DIAGNOSIS — R52 Pain, unspecified: Secondary | ICD-10-CM | POA: Diagnosis not present

## 2017-01-25 DIAGNOSIS — B20 Human immunodeficiency virus [HIV] disease: Secondary | ICD-10-CM | POA: Diagnosis not present

## 2017-02-03 DIAGNOSIS — M65342 Trigger finger, left ring finger: Secondary | ICD-10-CM | POA: Diagnosis not present

## 2017-03-15 DIAGNOSIS — Z96652 Presence of left artificial knee joint: Secondary | ICD-10-CM | POA: Diagnosis not present

## 2017-05-11 DIAGNOSIS — Z8673 Personal history of transient ischemic attack (TIA), and cerebral infarction without residual deficits: Secondary | ICD-10-CM | POA: Diagnosis not present

## 2017-05-11 DIAGNOSIS — G8929 Other chronic pain: Secondary | ICD-10-CM | POA: Diagnosis not present

## 2017-05-11 DIAGNOSIS — Z79899 Other long term (current) drug therapy: Secondary | ICD-10-CM | POA: Diagnosis not present

## 2017-05-11 DIAGNOSIS — Z21 Asymptomatic human immunodeficiency virus [HIV] infection status: Secondary | ICD-10-CM | POA: Diagnosis not present

## 2017-05-11 DIAGNOSIS — Z7982 Long term (current) use of aspirin: Secondary | ICD-10-CM | POA: Diagnosis not present

## 2017-05-11 DIAGNOSIS — I10 Essential (primary) hypertension: Secondary | ICD-10-CM | POA: Diagnosis not present

## 2017-05-11 DIAGNOSIS — M65342 Trigger finger, left ring finger: Secondary | ICD-10-CM | POA: Diagnosis not present

## 2017-05-11 DIAGNOSIS — K219 Gastro-esophageal reflux disease without esophagitis: Secondary | ICD-10-CM | POA: Diagnosis not present

## 2017-05-16 DIAGNOSIS — H2511 Age-related nuclear cataract, right eye: Secondary | ICD-10-CM | POA: Diagnosis not present

## 2017-05-24 DIAGNOSIS — B20 Human immunodeficiency virus [HIV] disease: Secondary | ICD-10-CM | POA: Diagnosis not present

## 2017-05-24 DIAGNOSIS — K7469 Other cirrhosis of liver: Secondary | ICD-10-CM | POA: Diagnosis not present

## 2017-05-24 DIAGNOSIS — B1920 Unspecified viral hepatitis C without hepatic coma: Secondary | ICD-10-CM | POA: Diagnosis not present

## 2017-05-27 DIAGNOSIS — Z79899 Other long term (current) drug therapy: Secondary | ICD-10-CM | POA: Diagnosis not present

## 2017-05-27 DIAGNOSIS — Z7982 Long term (current) use of aspirin: Secondary | ICD-10-CM | POA: Diagnosis not present

## 2017-05-27 DIAGNOSIS — Z79891 Long term (current) use of opiate analgesic: Secondary | ICD-10-CM | POA: Diagnosis not present

## 2017-05-27 DIAGNOSIS — Z6837 Body mass index (BMI) 37.0-37.9, adult: Secondary | ICD-10-CM | POA: Diagnosis not present

## 2017-05-27 DIAGNOSIS — C19 Malignant neoplasm of rectosigmoid junction: Secondary | ICD-10-CM | POA: Diagnosis not present

## 2017-05-27 DIAGNOSIS — Z8673 Personal history of transient ischemic attack (TIA), and cerebral infarction without residual deficits: Secondary | ICD-10-CM | POA: Diagnosis not present

## 2017-05-27 DIAGNOSIS — Z1211 Encounter for screening for malignant neoplasm of colon: Secondary | ICD-10-CM | POA: Diagnosis not present

## 2017-05-27 DIAGNOSIS — Z21 Asymptomatic human immunodeficiency virus [HIV] infection status: Secondary | ICD-10-CM | POA: Diagnosis not present

## 2017-05-27 DIAGNOSIS — G8929 Other chronic pain: Secondary | ICD-10-CM | POA: Diagnosis not present

## 2017-05-27 DIAGNOSIS — Z880 Allergy status to penicillin: Secondary | ICD-10-CM | POA: Diagnosis not present

## 2017-06-15 DIAGNOSIS — H2511 Age-related nuclear cataract, right eye: Secondary | ICD-10-CM | POA: Diagnosis not present

## 2017-06-20 ENCOUNTER — Encounter: Payer: Self-pay | Admitting: *Deleted

## 2017-06-21 ENCOUNTER — Ambulatory Visit: Payer: Medicare HMO | Admitting: Anesthesiology

## 2017-06-21 ENCOUNTER — Ambulatory Visit
Admission: RE | Admit: 2017-06-21 | Discharge: 2017-06-21 | Disposition: A | Discharge status: Home / Self Care | Payer: Medicare HMO | Source: Ambulatory Visit | Attending: Ophthalmology | Admitting: Ophthalmology

## 2017-06-21 ENCOUNTER — Encounter
Admission: RE | Disposition: A | Discharge status: Home / Self Care | Payer: Self-pay | Source: Ambulatory Visit | Attending: Ophthalmology

## 2017-06-21 ENCOUNTER — Encounter: Payer: Self-pay | Admitting: Ophthalmology

## 2017-06-21 DIAGNOSIS — Z79899 Other long term (current) drug therapy: Secondary | ICD-10-CM | POA: Insufficient documentation

## 2017-06-21 DIAGNOSIS — Z96653 Presence of artificial knee joint, bilateral: Secondary | ICD-10-CM | POA: Diagnosis not present

## 2017-06-21 DIAGNOSIS — H2511 Age-related nuclear cataract, right eye: Secondary | ICD-10-CM | POA: Diagnosis not present

## 2017-06-21 DIAGNOSIS — I10 Essential (primary) hypertension: Secondary | ICD-10-CM | POA: Diagnosis not present

## 2017-06-21 DIAGNOSIS — E7800 Pure hypercholesterolemia, unspecified: Secondary | ICD-10-CM | POA: Diagnosis not present

## 2017-06-21 DIAGNOSIS — K219 Gastro-esophageal reflux disease without esophagitis: Secondary | ICD-10-CM | POA: Insufficient documentation

## 2017-06-21 DIAGNOSIS — Z880 Allergy status to penicillin: Secondary | ICD-10-CM | POA: Insufficient documentation

## 2017-06-21 DIAGNOSIS — Z7982 Long term (current) use of aspirin: Secondary | ICD-10-CM | POA: Diagnosis not present

## 2017-06-21 DIAGNOSIS — E669 Obesity, unspecified: Secondary | ICD-10-CM | POA: Diagnosis not present

## 2017-06-21 HISTORY — PX: CATARACT EXTRACTION W/PHACO: SHX586

## 2017-06-21 HISTORY — DX: Unspecified hearing loss, unspecified ear: H91.90

## 2017-06-21 SURGERY — PHACOEMULSIFICATION, CATARACT, WITH IOL INSERTION
Anesthesia: Monitor Anesthesia Care | Site: Eye | Laterality: Right | Wound class: Clean

## 2017-06-21 MED ORDER — NA CHONDROIT SULF-NA HYALURON 40-17 MG/ML IO SOLN
INTRAOCULAR | Status: DC | PRN
Start: 2017-06-21 — End: 2017-06-21
  Administered 2017-06-21: 1 mL via INTRAOCULAR

## 2017-06-21 MED ORDER — FENTANYL CITRATE (PF) 100 MCG/2ML IJ SOLN
INTRAMUSCULAR | Status: DC | PRN
  Administered 2017-06-21: 25 ug via INTRAVENOUS
  Administered 2017-06-21: 50 ug via INTRAVENOUS

## 2017-06-21 MED ORDER — LIDOCAINE HCL (PF) 4 % IJ SOLN
INTRAMUSCULAR | Status: DC | PRN
  Administered 2017-06-21: 4 mL via OPHTHALMIC

## 2017-06-21 MED ORDER — LIDOCAINE HCL (PF) 4 % IJ SOLN
INTRAMUSCULAR | Status: AC
  Filled 2017-06-21: qty 5

## 2017-06-21 MED ORDER — MIDAZOLAM HCL 2 MG/2ML IJ SOLN
INTRAMUSCULAR | Status: DC | PRN
  Administered 2017-06-21: 1 mg via INTRAVENOUS

## 2017-06-21 MED ORDER — EPINEPHRINE PF 1 MG/ML IJ SOLN
INTRAMUSCULAR | Status: AC
  Filled 2017-06-21: qty 1

## 2017-06-21 MED ORDER — NA CHONDROIT SULF-NA HYALURON 40-17 MG/ML IO SOLN
INTRAOCULAR | Status: AC
Start: 2017-06-21 — End: 2017-06-21
  Filled 2017-06-21: qty 1

## 2017-06-21 MED ORDER — FENTANYL CITRATE (PF) 100 MCG/2ML IJ SOLN
INTRAMUSCULAR | Status: AC
  Filled 2017-06-21: qty 2

## 2017-06-21 MED ORDER — ARMC OPHTHALMIC DILATING DROPS
1.0000 "application " | OPHTHALMIC | Status: AC
  Administered 2017-06-21 (×3): 1 via OPHTHALMIC

## 2017-06-21 MED ORDER — ARMC OPHTHALMIC DILATING DROPS
OPHTHALMIC | Status: AC
  Administered 2017-06-21: 1 via OPHTHALMIC
  Filled 2017-06-21: qty 0.4

## 2017-06-21 MED ORDER — EPINEPHRINE PF 1 MG/ML IJ SOLN
INTRAMUSCULAR | Status: DC | PRN
  Administered 2017-06-21: 12:00:00 via OPHTHALMIC

## 2017-06-21 MED ORDER — MIDAZOLAM HCL 2 MG/2ML IJ SOLN
INTRAMUSCULAR | Status: AC
  Filled 2017-06-21: qty 2

## 2017-06-21 MED ORDER — POVIDONE-IODINE 5 % OP SOLN
OPHTHALMIC | Status: DC | PRN
  Administered 2017-06-21: 1 via OPHTHALMIC

## 2017-06-21 MED ORDER — MOXIFLOXACIN HCL 0.5 % OP SOLN
OPHTHALMIC | Status: AC
  Filled 2017-06-21: qty 3

## 2017-06-21 MED ORDER — POVIDONE-IODINE 5 % OP SOLN
OPHTHALMIC | Status: AC
  Filled 2017-06-21: qty 30

## 2017-06-21 MED ORDER — SODIUM CHLORIDE 0.9 % IV SOLN
INTRAVENOUS | Status: DC
  Administered 2017-06-21: 11:00:00 via INTRAVENOUS

## 2017-06-21 MED ORDER — CARBACHOL 0.01 % IO SOLN
INTRAOCULAR | Status: DC | PRN
Start: 2017-06-21 — End: 2017-06-21
  Administered 2017-06-21: 0.5 mL via INTRAOCULAR

## 2017-06-21 MED ORDER — MOXIFLOXACIN HCL 0.5 % OP SOLN
OPHTHALMIC | Status: DC | PRN
  Administered 2017-06-21: 0.2 mL via OPHTHALMIC

## 2017-06-21 SURGICAL SUPPLY — 17 items
GLOVE BIO SURGEON STRL SZ8 (GLOVE) ×3 IMPLANT
GLOVE BIOGEL M 6.5 STRL (GLOVE) ×3 IMPLANT
GLOVE SURG LX 8.0 MICRO (GLOVE) ×2
GLOVE SURG LX STRL 8.0 MICRO (GLOVE) ×1 IMPLANT
GOWN STRL REUS W/ TWL LRG LVL3 (GOWN DISPOSABLE) ×2 IMPLANT
GOWN STRL REUS W/TWL LRG LVL3 (GOWN DISPOSABLE) ×4
LABEL CATARACT MEDS ST (LABEL) ×3 IMPLANT
LENS IOL TECNIS ITEC 21.5 (Intraocular Lens) ×3 IMPLANT
PACK CATARACT (MISCELLANEOUS) ×3 IMPLANT
PACK CATARACT BRASINGTON LX (MISCELLANEOUS) ×3 IMPLANT
PACK EYE AFTER SURG (MISCELLANEOUS) ×3 IMPLANT
RING MALYGIN 7.0 (MISCELLANEOUS) ×3 IMPLANT
SOL BSS BAG (MISCELLANEOUS) ×3
SOLUTION BSS BAG (MISCELLANEOUS) ×1 IMPLANT
SYR 5ML LL (SYRINGE) ×3 IMPLANT
WATER STERILE IRR 250ML POUR (IV SOLUTION) ×3 IMPLANT
WIPE NON LINTING 3.25X3.25 (MISCELLANEOUS) ×3 IMPLANT

## 2017-06-21 NOTE — H&P (Signed)
All labs reviewed. Abnormal studies sent to patients PCP when indicated.  Previous H&P reviewed, patient examined, there are NO CHANGES.  Lisa Hawkins

## 2017-06-21 NOTE — Transfer of Care (Signed)
Immediate Anesthesia Transfer of Care Note  Patient: Lisa Hawkins  Procedure(s) Performed: Procedure(s) with comments: CATARACT EXTRACTION PHACO AND INTRAOCULAR LENS PLACEMENT (IOC) (Right) - [place] 00:30.9 AP% 12.7 CDE 3.94 Fluid Pack Lot # [id number]  Patient Location: PACU  Anesthesia Type:MAC  Level of Consciousness: awake, alert  and oriented  Airway & Oxygen Therapy: Patient Spontanous Breathing  Post-op Assessment: Report given to RN and Post -op Vital signs reviewed and stable  Post vital signs: Reviewed and stable  Last Vitals:  Vitals:   06/21/17 1112  BP: (!) 143/93  Pulse: (!) 111  Resp: 16  Temp: 36.9 C    Last Pain:  Vitals:   06/21/17 1112  TempSrc: Oral         Complications: No apparent anesthesia complications

## 2017-06-21 NOTE — Anesthesia Preprocedure Evaluation (Signed)
Anesthesia Evaluation  Patient identified by MRN, date of birth, ID band Patient awake    Reviewed: Allergy & Precautions, NPO status , Patient's Chart, lab work & pertinent test results  History of Anesthesia Complications (+) PONV and history of anesthetic complications  Airway Mallampati: III  TM Distance: >3 FB Neck ROM: Full    Dental  (+) Missing, Poor Dentition   Pulmonary neg sleep apnea, neg COPD, Current Smoker,    breath sounds clear to auscultation- rhonchi (-) wheezing      Cardiovascular hypertension, Pt. on medications (-) CAD, (-) Past MI and (-) Cardiac Stents  Rhythm:Regular Rate:Normal - Systolic murmurs and - Diastolic murmurs    Neuro/Psych Anxiety negative neurological ROS     GI/Hepatic GERD  ,(+) Hepatitis -, C  Endo/Other  negative endocrine ROSneg diabetes  Renal/GU negative Renal ROS     Musculoskeletal  (+) Arthritis ,   Abdominal (+) + obese,   Peds  Hematology negative hematology ROS (+)   Anesthesia Other Findings Past Medical History: No date: Anxiety No date: Arthritis No date: Dysrhythmia No date: GERD (gastroesophageal reflux disease) No date: Hepatitis     Comment:  Hep C - treated with Harvoni No date: HIV (human immunodeficiency virus infection) ([place]) No date: HOH (hard of hearing) No date: Hypertension No date: Neuropathy No date: PONV (postoperative nausea and vomiting)   Reproductive/Obstetrics                             Anesthesia Physical Anesthesia Plan  ASA: III  Anesthesia Plan: MAC   Post-op Pain Management:    Induction: Intravenous  PONV Risk Score and Plan: 2 and Midazolam  Airway Management Planned: Natural Airway  Additional Equipment:   Intra-op Plan:   Post-operative Plan:   Informed Consent: I have reviewed the patients History and Physical, chart, labs and discussed the procedure including the risks, benefits  and alternatives for the proposed anesthesia with the patient or authorized representative who has indicated his/her understanding and acceptance.     Plan Discussed with: CRNA and Anesthesiologist  Anesthesia Plan Comments:         Anesthesia Quick Evaluation

## 2017-06-21 NOTE — Op Note (Signed)
PREOPERATIVE DIAGNOSIS:  Nuclear sclerotic cataract of the right eye.   POSTOPERATIVE DIAGNOSIS:  NUCLEAR SCLEROTIC CATARACT RIGHT EYE   OPERATIVE PROCEDURE: Procedure(s): CATARACT EXTRACTION PHACO AND INTRAOCULAR LENS PLACEMENT (IOC)   SURGEON:  Birder Robson, MD.   ANESTHESIA:  Anesthesiologist: Emmie Niemann, MD CRNA: Darlyne Russian, CRNA  1.      Managed anesthesia care. 2.      0.85ml of Shugarcaine was instilled in the eye following the paracentesis.   COMPLICATIONS: Viscoelastic was used to raise the pupil margin.  A  Malyugin ring was placed as the pupil would not achieve sufficient pharmacologic dilation to undergo cataract extraction safely.( The ring was removed atraumatically following insertion of the IOL.)    TECHNIQUE:   Stop and chop   DESCRIPTION OF PROCEDURE:  The patient was examined and consented in the preoperative holding area where the aforementioned topical anesthesia was applied to the right eye and then brought back to the Operating Room where the right eye was prepped and draped in the usual sterile ophthalmic fashion and a lid speculum was placed. A paracentesis was created with the side port blade and the anterior chamber was filled with viscoelastic. A near clear corneal incision was performed with the steel keratome. A continuous curvilinear capsulorrhexis was performed with a cystotome followed by the capsulorrhexis forceps. Hydrodissection and hydrodelineation were carried out with BSS on a blunt cannula. The lens was removed in a stop and chop  technique and the remaining cortical material was removed with the irrigation-aspiration handpiece. The capsular bag was inflated with viscoelastic and the Technis ZCB00  lens was placed in the capsular bag without complication. The remaining viscoelastic was removed from the eye with the irrigation-aspiration handpiece. The wounds were hydrated. The anterior chamber was flushed with Miostat and the eye was inflated  to physiologic pressure. 0.42ml of Vigamox was placed in the anterior chamber. The wounds were found to be water tight. The eye was dressed with Vigamox. The patient was given protective glasses to wear throughout the day and a shield with which to sleep tonight. The patient was also given drops with which to begin a drop regimen today and will follow-up with me in one day.  Implant Name Type Inv. Item Serial No. Manufacturer Lot No. LRB No. Used  LENS IOL DIOP 21.5 - [id number] Intraocular Lens LENS IOL DIOP 21.5 [id number] AMO   Right 1   Procedure(s) with comments: CATARACT EXTRACTION PHACO AND INTRAOCULAR LENS PLACEMENT (IOC) (Right) - [place] 00:30.9 AP% 12.7 CDE 3.94 Fluid Pack Lot # [id number]  Electronically signed: Tim Lair 06/21/2017 12:38 PM

## 2017-06-21 NOTE — Discharge Instructions (Signed)
Eye Surgery Discharge Instructions  Expect mild scratchy sensation or mild soreness. DO NOT RUB YOUR EYE!  The day of surgery:  Minimal physical activity, but bed rest is not required  No reading, computer work, or close hand work  No bending, lifting, or straining.  May watch TV  For 24 hours:  No driving, legal decisions, or alcoholic beverages  Safety precautions  Eat anything you prefer: It is better to start with liquids, then soup then solid foods.  _____ Eye patch should be worn until postoperative exam tomorrow.  ____ Solar shield eyeglasses should be worn for comfort in the sunlight/patch while sleeping  Resume all regular medications including aspirin or Coumadin if these were discontinued prior to surgery. You may shower, bathe, shave, or wash your hair. Tylenol may be taken for mild discomfort.  Call your doctor if you experience significant pain, nausea, or vomiting, fever > 101 or other signs of infection. [phone] or [phone] Specific instructions:  Follow-up Information    Birder Robson, MD Follow up on 06/22/2017.   Specialty:  Ophthalmology Why:  9:40 Contact information: [address] [place] [address] [phone]

## 2017-06-21 NOTE — Anesthesia Postprocedure Evaluation (Signed)
Anesthesia Post Note  Patient: Lisa Hawkins  Procedure(s) Performed: Procedure(s) (LRB): CATARACT EXTRACTION PHACO AND INTRAOCULAR LENS PLACEMENT (IOC) (Right)  Patient location during evaluation: PACU Anesthesia Type: MAC Level of consciousness: awake and alert Pain management: pain level controlled Vital Signs Assessment: post-procedure vital signs reviewed and stable Respiratory status: spontaneous breathing, nonlabored ventilation, respiratory function stable and patient connected to nasal cannula oxygen Cardiovascular status: stable and blood pressure returned to baseline Anesthetic complications: no     Last Vitals:  Vitals:   06/21/17 1112 06/21/17 1242  BP: (!) 143/93 (!) 131/97  Pulse: (!) 111 (!) 114  Resp: 16 (!) 21  Temp: 36.9 C     Last Pain:  Vitals:   06/21/17 1112  TempSrc: Oral                 Darlyne Russian

## 2017-06-21 NOTE — Anesthesia Post-op Follow-up Note (Cosign Needed)
Anesthesia QCDR form completed.        

## 2017-06-30 DIAGNOSIS — H2512 Age-related nuclear cataract, left eye: Secondary | ICD-10-CM | POA: Diagnosis not present

## 2017-07-06 ENCOUNTER — Encounter: Payer: Self-pay | Admitting: *Deleted

## 2017-07-06 NOTE — Pre-Procedure Instructions (Signed)
Called office regarding Vigamox, spoke with Jenny Reichmann, she is to call back with a patient.

## 2017-07-12 ENCOUNTER — Ambulatory Visit: Payer: Medicare HMO | Admitting: Anesthesiology

## 2017-07-12 ENCOUNTER — Encounter: Payer: Self-pay | Admitting: *Deleted

## 2017-07-12 ENCOUNTER — Encounter
Admission: RE | Disposition: A | Discharge status: Home / Self Care | Payer: Self-pay | Source: Ambulatory Visit | Attending: Ophthalmology

## 2017-07-12 ENCOUNTER — Ambulatory Visit
Admission: RE | Admit: 2017-07-12 | Discharge: 2017-07-12 | Disposition: A | Discharge status: Home / Self Care | Payer: Medicare HMO | Source: Ambulatory Visit | Attending: Ophthalmology | Admitting: Ophthalmology

## 2017-07-12 DIAGNOSIS — E7800 Pure hypercholesterolemia, unspecified: Secondary | ICD-10-CM | POA: Insufficient documentation

## 2017-07-12 DIAGNOSIS — Z8673 Personal history of transient ischemic attack (TIA), and cerebral infarction without residual deficits: Secondary | ICD-10-CM | POA: Diagnosis not present

## 2017-07-12 DIAGNOSIS — K219 Gastro-esophageal reflux disease without esophagitis: Secondary | ICD-10-CM | POA: Diagnosis not present

## 2017-07-12 DIAGNOSIS — Z6837 Body mass index (BMI) 37.0-37.9, adult: Secondary | ICD-10-CM | POA: Diagnosis not present

## 2017-07-12 DIAGNOSIS — I4891 Unspecified atrial fibrillation: Secondary | ICD-10-CM | POA: Insufficient documentation

## 2017-07-12 DIAGNOSIS — F419 Anxiety disorder, unspecified: Secondary | ICD-10-CM | POA: Diagnosis not present

## 2017-07-12 DIAGNOSIS — Z79899 Other long term (current) drug therapy: Secondary | ICD-10-CM | POA: Insufficient documentation

## 2017-07-12 DIAGNOSIS — F17200 Nicotine dependence, unspecified, uncomplicated: Secondary | ICD-10-CM | POA: Diagnosis not present

## 2017-07-12 DIAGNOSIS — I10 Essential (primary) hypertension: Secondary | ICD-10-CM | POA: Insufficient documentation

## 2017-07-12 DIAGNOSIS — H2512 Age-related nuclear cataract, left eye: Secondary | ICD-10-CM | POA: Insufficient documentation

## 2017-07-12 HISTORY — DX: Bronchitis, not specified as acute or chronic: J40

## 2017-07-12 HISTORY — DX: Adverse effect of unspecified anesthetic, initial encounter: T41.45XA

## 2017-07-12 HISTORY — DX: Acute myocardial infarction, unspecified: I21.9

## 2017-07-12 HISTORY — DX: Cerebral infarction, unspecified: I63.9

## 2017-07-12 HISTORY — DX: Hyperlipidemia, unspecified: E78.5

## 2017-07-12 HISTORY — DX: Other complications of anesthesia, initial encounter: T88.59XA

## 2017-07-12 HISTORY — PX: CATARACT EXTRACTION W/PHACO: SHX586

## 2017-07-12 HISTORY — DX: Unspecified convulsions: R56.9

## 2017-07-12 SURGERY — PHACOEMULSIFICATION, CATARACT, WITH IOL INSERTION
Anesthesia: Monitor Anesthesia Care | Site: Eye | Laterality: Left | Wound class: Clean

## 2017-07-12 MED ORDER — NA CHONDROIT SULF-NA HYALURON 40-17 MG/ML IO SOLN
INTRAOCULAR | Status: AC
  Filled 2017-07-12: qty 1

## 2017-07-12 MED ORDER — CARBACHOL 0.01 % IO SOLN
INTRAOCULAR | Status: DC | PRN
  Administered 2017-07-12: .5 mL via INTRAOCULAR

## 2017-07-12 MED ORDER — LIDOCAINE HCL (PF) 4 % IJ SOLN
INTRAMUSCULAR | Status: AC
  Filled 2017-07-12: qty 5

## 2017-07-12 MED ORDER — FENTANYL CITRATE (PF) 100 MCG/2ML IJ SOLN
INTRAMUSCULAR | Status: DC | PRN
  Administered 2017-07-12: 50 ug via INTRAVENOUS

## 2017-07-12 MED ORDER — FENTANYL CITRATE (PF) 100 MCG/2ML IJ SOLN
INTRAMUSCULAR | Status: AC
  Filled 2017-07-12: qty 2

## 2017-07-12 MED ORDER — EPINEPHRINE PF 1 MG/ML IJ SOLN
INTRAOCULAR | Status: DC | PRN
  Administered 2017-07-12: 1 mL via OPHTHALMIC

## 2017-07-12 MED ORDER — LIDOCAINE HCL (PF) 4 % IJ SOLN
INTRAMUSCULAR | Status: DC | PRN
  Administered 2017-07-12: 2 mL via OPHTHALMIC

## 2017-07-12 MED ORDER — MOXIFLOXACIN HCL 0.5 % OP SOLN
OPHTHALMIC | Status: AC
  Filled 2017-07-12: qty 3

## 2017-07-12 MED ORDER — NA CHONDROIT SULF-NA HYALURON 40-17 MG/ML IO SOLN
INTRAOCULAR | Status: DC | PRN
  Administered 2017-07-12: 1 mL via INTRAOCULAR

## 2017-07-12 MED ORDER — EPINEPHRINE PF 1 MG/ML IJ SOLN
INTRAMUSCULAR | Status: AC
Start: 2017-07-12 — End: ?
  Filled 2017-07-12: qty 1

## 2017-07-12 MED ORDER — ARMC OPHTHALMIC DILATING DROPS
OPHTHALMIC | Status: AC
  Administered 2017-07-12: 1 via OPHTHALMIC
  Filled 2017-07-12: qty 0.4

## 2017-07-12 MED ORDER — SODIUM CHLORIDE 0.9 % IV SOLN
INTRAVENOUS | Status: DC
  Administered 2017-07-12: 20 mL/h via INTRAVENOUS

## 2017-07-12 MED ORDER — MIDAZOLAM HCL 2 MG/2ML IJ SOLN
INTRAMUSCULAR | Status: DC | PRN
  Administered 2017-07-12 (×2): 1 mg via INTRAVENOUS

## 2017-07-12 MED ORDER — MOXIFLOXACIN HCL 0.5 % OP SOLN
OPHTHALMIC | Status: DC | PRN
  Administered 2017-07-12: .2 mL via OPHTHALMIC

## 2017-07-12 MED ORDER — POVIDONE-IODINE 5 % OP SOLN
OPHTHALMIC | Status: DC | PRN
  Administered 2017-07-12: 1 via OPHTHALMIC

## 2017-07-12 MED ORDER — MOXIFLOXACIN HCL 0.5 % OP SOLN: 1.0000 [drp] | OPHTHALMIC | Status: DC | PRN

## 2017-07-12 MED ORDER — ARMC OPHTHALMIC DILATING DROPS
1.0000 "application " | OPHTHALMIC | Status: AC
  Administered 2017-07-12 (×3): 1 via OPHTHALMIC

## 2017-07-12 MED ORDER — MIDAZOLAM HCL 2 MG/2ML IJ SOLN
INTRAMUSCULAR | Status: AC
  Filled 2017-07-12: qty 2

## 2017-07-12 SURGICAL SUPPLY — 16 items

## 2017-07-12 NOTE — Op Note (Signed)
PREOPERATIVE DIAGNOSIS:  Nuclear sclerotic cataract of the left eye.   POSTOPERATIVE DIAGNOSIS:  Nuclear sclerotic cataract of the left eye.   OPERATIVE PROCEDURE: Procedure(s): CATARACT EXTRACTION PHACO AND INTRAOCULAR LENS PLACEMENT (IOC)   SURGEON:  Birder Robson, MD.   ANESTHESIA:  Anesthesiologist: Gunnar Fusi, MD CRNA: Darlyne Russian, CRNA  1.      Managed anesthesia care. 2.     0.67ml of Shugarcaine was instilled following the paracentesis   COMPLICATIONS:  None.   TECHNIQUE:   Stop and chop   DESCRIPTION OF PROCEDURE:  The patient was examined and consented in the preoperative holding area where the aforementioned topical anesthesia was applied to the left eye and then brought back to the Operating Room where the left eye was prepped and draped in the usual sterile ophthalmic fashion and a lid speculum was placed. A paracentesis was created with the side port blade and the anterior chamber was filled with viscoelastic. A near clear corneal incision was performed with the steel keratome. A continuous curvilinear capsulorrhexis was performed with a cystotome followed by the capsulorrhexis forceps. Hydrodissection and hydrodelineation were carried out with BSS on a blunt cannula. The lens was removed in a stop and chop  technique and the remaining cortical material was removed with the irrigation-aspiration handpiece. The capsular bag was inflated with viscoelastic and the Technis ZCB00 lens was placed in the capsular bag without complication. The remaining viscoelastic was removed from the eye with the irrigation-aspiration handpiece. The wounds were hydrated. The anterior chamber was flushed with Miostat and the eye was inflated to physiologic pressure. 0.18ml Vigamox was placed in the anterior chamber. The wounds were found to be water tight. The eye was dressed with Vigamox. The patient was given protective glasses to wear throughout the day and a shield with which to sleep  tonight. The patient was also given drops with which to begin a drop regimen today and will follow-up with me in one day.  Implant Name Type Inv. Item Serial No. Manufacturer Lot No. LRB No. Used  LENS IOL DIOP 21.5 - [id number] Intraocular Lens LENS IOL DIOP 21.5 [id number] AMO   Left 1    Procedure(s) with comments: CATARACT EXTRACTION PHACO AND INTRAOCULAR LENS PLACEMENT (IOC) (Left) - [place]  00:42 AP% 15.2 CDE 6.38 Fluid pack lot # [id number]  Electronically signed: Golinda 07/12/2017 1:03 PM

## 2017-07-12 NOTE — Anesthesia Procedure Notes (Signed)
Procedure Name: MAC Date/Time: 07/12/2017 12:44 PM Performed by: Darlyne Russian Pre-anesthesia Checklist: Patient identified Patient Re-evaluated:Patient Re-evaluated prior to induction Oxygen Delivery Method: Nasal cannula Placement Confirmation: positive ETCO2

## 2017-07-12 NOTE — Transfer of Care (Signed)
Immediate Anesthesia Transfer of Care Note  Patient: Lisa Hawkins  Procedure(s) Performed: Procedure(s) with comments: CATARACT EXTRACTION PHACO AND INTRAOCULAR LENS PLACEMENT (IOC) (Left) - [place]  00:42 AP% 15.2 CDE 6.38 Fluid pack lot # [id number]  Patient Location: PACU  Anesthesia Type:MAC  Level of Consciousness: awake, alert  and oriented  Airway & Oxygen Therapy: Patient Spontanous Breathing  Post-op Assessment: Report given to RN and Post -op Vital signs reviewed and stable  Post vital signs: Reviewed and stable  Last Vitals:  Vitals:   07/12/17 1138 07/12/17 1306  BP: 134/84 110/66  Pulse: (!) 117 (!) 113  Resp: 16 16  SpO2: 100% 94%    Last Pain:  Vitals:   07/12/17 1306  TempSrc: Temporal  PainSc: 0-No pain      Patients Stated Pain Goal: 0 (34/19/37 9024)  Complications: No apparent anesthesia complications

## 2017-07-12 NOTE — Anesthesia Preprocedure Evaluation (Signed)
Anesthesia Evaluation  Patient identified by MRN, date of birth, ID band Patient awake    Reviewed: Allergy & Precautions, NPO status , Patient's Chart, lab work & pertinent test results  History of Anesthesia Complications (+) PONV and history of anesthetic complications  Airway Mallampati: III       Dental  (+) Partial Upper   Pulmonary neg pulmonary ROS, Current Smoker,           Cardiovascular hypertension, Pt. on medications + Past MI  + dysrhythmias Atrial Fibrillation      Neuro/Psych Seizures - (pt with memory loss and lack of awareness, negative w/u),  Anxiety CVA (left sided weakness, resolved)    GI/Hepatic GERD  Medicated,(+) Hepatitis -, C  Endo/Other  neg diabetes  Renal/GU negative Renal ROS     Musculoskeletal   Abdominal   Peds  Hematology   Anesthesia Other Findings   Reproductive/Obstetrics                            Anesthesia Physical Anesthesia Plan  ASA: III  Anesthesia Plan: MAC   Post-op Pain Management:    Induction:   PONV Risk Score and Plan:   Airway Management Planned: Nasal Cannula  Additional Equipment:   Intra-op Plan:   Post-operative Plan:   Informed Consent: I have reviewed the patients History and Physical, chart, labs and discussed the procedure including the risks, benefits and alternatives for the proposed anesthesia with the patient or authorized representative who has indicated his/her understanding and acceptance.     Plan Discussed with:   Anesthesia Plan Comments:         Anesthesia Quick Evaluation

## 2017-07-12 NOTE — H&P (Signed)
All labs reviewed. Abnormal studies sent to patients PCP when indicated.  Previous H&P reviewed, patient examined, there are NO CHANGES.  Danelle Curiale LOUIS8/14/201812:37 PM

## 2017-07-12 NOTE — Anesthesia Post-op Follow-up Note (Signed)
Anesthesia QCDR form completed.        

## 2017-07-12 NOTE — Discharge Instructions (Signed)
Eye Surgery Discharge Instructions  Expect mild scratchy sensation or mild soreness. DO NOT RUB YOUR EYE!  The day of surgery:  Minimal physical activity, but bed rest is not required  No reading, computer work, or close hand work  No bending, lifting, or straining.  May watch TV  For 24 hours:  No driving, legal decisions, or alcoholic beverages  Safety precautions  Eat anything you prefer: It is better to start with liquids, then soup then solid foods.  _____ Eye patch should be worn until postoperative exam tomorrow.  ____ Solar shield eyeglasses should be worn for comfort in the sunlight/patch while sleeping  Resume all regular medications including aspirin or Coumadin if these were discontinued prior to surgery. You may shower, bathe, shave, or wash your hair. Tylenol may be taken for mild discomfort.  Call your doctor if you experience significant pain, nausea, or vomiting, fever > 101 or other signs of infection. [phone] or [phone] Specific instructions:  Follow-up Information    Birder Robson, MD Follow up.   Specialty:  Ophthalmology Why:  August 15 at 9:05am Contact information: [address] [place] [address] [phone]

## 2017-07-12 NOTE — Anesthesia Postprocedure Evaluation (Signed)
Anesthesia Post Note  Patient: TIONDRA FANG  Procedure(s) Performed: Procedure(s) (LRB): CATARACT EXTRACTION PHACO AND INTRAOCULAR LENS PLACEMENT (IOC) (Left)  Patient location during evaluation: PACU Anesthesia Type: MAC Level of consciousness: awake and alert Pain management: pain level controlled Vital Signs Assessment: post-procedure vital signs reviewed and stable Respiratory status: spontaneous breathing, nonlabored ventilation, respiratory function stable and patient connected to nasal cannula oxygen Cardiovascular status: stable and blood pressure returned to baseline Anesthetic complications: no     Last Vitals:  Vitals:   07/12/17 1138 07/12/17 1306  BP: 134/84 110/66  Pulse: (!) 117 (!) 113  Resp: 16 16  SpO2: 100% 94%    Last Pain:  Vitals:   07/12/17 1306  TempSrc: Temporal  PainSc: 0-No pain                 Darlyne Russian

## 2017-08-29 DIAGNOSIS — Z961 Presence of intraocular lens: Secondary | ICD-10-CM | POA: Diagnosis not present

## 2017-10-14 DIAGNOSIS — Z5181 Encounter for therapeutic drug level monitoring: Secondary | ICD-10-CM | POA: Diagnosis not present

## 2017-10-14 DIAGNOSIS — R55 Syncope and collapse: Secondary | ICD-10-CM | POA: Diagnosis not present

## 2017-10-14 DIAGNOSIS — Z79899 Other long term (current) drug therapy: Secondary | ICD-10-CM | POA: Diagnosis not present

## 2017-10-14 DIAGNOSIS — R000 Tachycardia, unspecified: Secondary | ICD-10-CM | POA: Diagnosis not present

## 2018-01-05 IMAGING — DX DG KNEE 1-2V*L*
2 series · 2 of 2 positions shown · non-contrast
Comparison: Left knee CT August 30, 2016

CLINICAL DATA: Status post total knee replacement

EXAM:
LEFT KNEE - 1-2 VIEW

[knee ap]
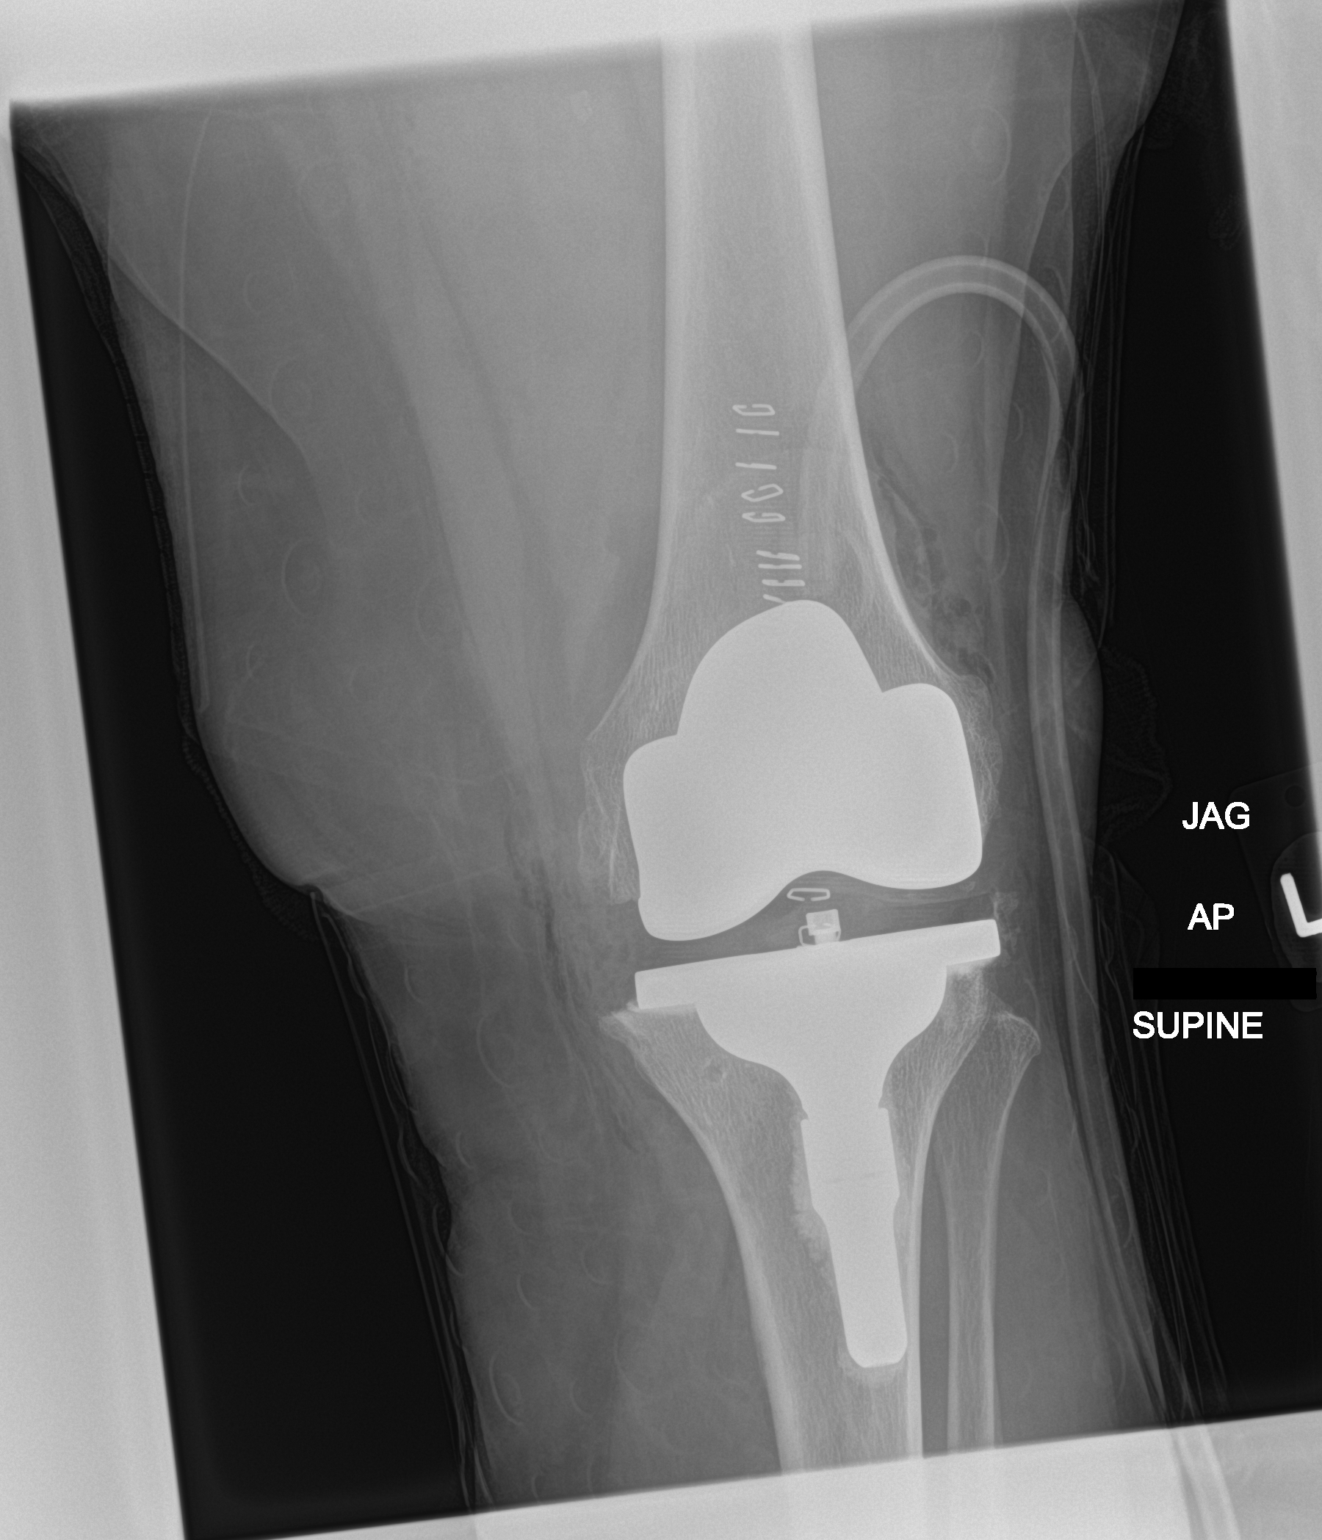

[knee lat]
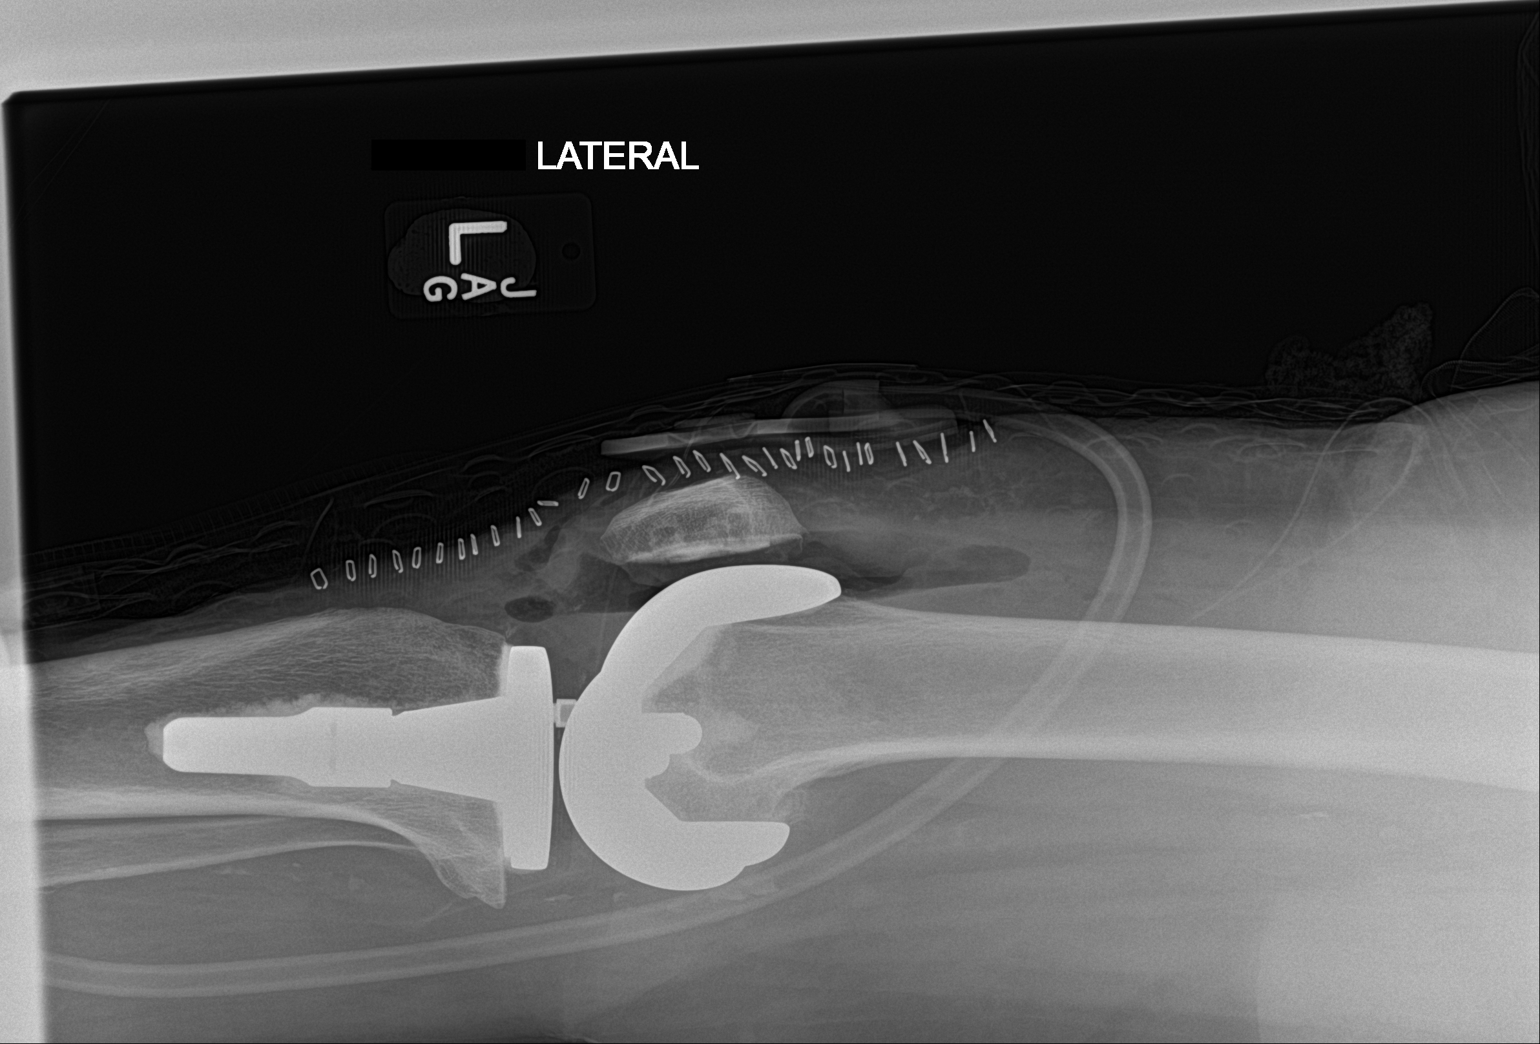

[2 of 2 positions shown; findings below may reference images not displayed]

FINDINGS: Frontal and lateral views were obtained. The patient is status post
total knee replacement with femoral and tibial prosthetic components
well-seated. No acute fracture or dislocation. Air within the joint
is an expected postoperative finding. There is a drain with its tip
anterior to the patella.
IMPRESSION: Prosthetic components appear well seated. No acute fracture or
dislocation.

## 2018-01-24 DIAGNOSIS — F1920 Other psychoactive substance dependence, uncomplicated: Secondary | ICD-10-CM | POA: Diagnosis not present

## 2018-01-24 DIAGNOSIS — K7469 Other cirrhosis of liver: Secondary | ICD-10-CM | POA: Diagnosis not present

## 2018-01-24 DIAGNOSIS — B20 Human immunodeficiency virus [HIV] disease: Secondary | ICD-10-CM | POA: Diagnosis not present

## 2018-01-24 DIAGNOSIS — B1920 Unspecified viral hepatitis C without hepatic coma: Secondary | ICD-10-CM | POA: Diagnosis not present

## 2018-01-24 DIAGNOSIS — F329 Major depressive disorder, single episode, unspecified: Secondary | ICD-10-CM | POA: Diagnosis not present

## 2018-01-24 DIAGNOSIS — Z23 Encounter for immunization: Secondary | ICD-10-CM | POA: Diagnosis not present

## 2018-01-24 DIAGNOSIS — Z780 Asymptomatic menopausal state: Secondary | ICD-10-CM | POA: Diagnosis not present

## 2018-01-24 DIAGNOSIS — R52 Pain, unspecified: Secondary | ICD-10-CM | POA: Diagnosis not present

## 2018-01-24 DIAGNOSIS — Z0000 Encounter for general adult medical examination without abnormal findings: Secondary | ICD-10-CM | POA: Diagnosis not present

## 2018-01-24 DIAGNOSIS — M5126 Other intervertebral disc displacement, lumbar region: Secondary | ICD-10-CM | POA: Diagnosis not present

## 2018-03-09 DIAGNOSIS — M1611 Unilateral primary osteoarthritis, right hip: Secondary | ICD-10-CM | POA: Diagnosis not present

## 2018-03-09 DIAGNOSIS — M4316 Spondylolisthesis, lumbar region: Secondary | ICD-10-CM | POA: Diagnosis not present

## 2018-03-09 DIAGNOSIS — M545 Low back pain: Secondary | ICD-10-CM | POA: Diagnosis not present

## 2018-03-09 DIAGNOSIS — M25551 Pain in right hip: Secondary | ICD-10-CM | POA: Diagnosis not present

## 2018-03-09 DIAGNOSIS — M5136 Other intervertebral disc degeneration, lumbar region: Secondary | ICD-10-CM | POA: Diagnosis not present

## 2018-03-09 DIAGNOSIS — M47816 Spondylosis without myelopathy or radiculopathy, lumbar region: Secondary | ICD-10-CM | POA: Diagnosis not present

## 2018-03-09 DIAGNOSIS — M5416 Radiculopathy, lumbar region: Secondary | ICD-10-CM | POA: Diagnosis not present

## 2018-04-12 ENCOUNTER — Emergency Department: Payer: Medicare HMO

## 2018-04-12 ENCOUNTER — Encounter: Payer: Self-pay | Admitting: Emergency Medicine

## 2018-04-12 ENCOUNTER — Other Ambulatory Visit: Payer: Self-pay

## 2018-04-12 ENCOUNTER — Emergency Department
Admission: EM | Admit: 2018-04-12 | Discharge: 2018-04-12 | Disposition: A | Discharge status: Home / Self Care | Payer: Medicare HMO | Attending: Emergency Medicine | Admitting: Emergency Medicine

## 2018-04-12 DIAGNOSIS — E876 Hypokalemia: Secondary | ICD-10-CM | POA: Insufficient documentation

## 2018-04-12 DIAGNOSIS — Z96653 Presence of artificial knee joint, bilateral: Secondary | ICD-10-CM | POA: Insufficient documentation

## 2018-04-12 DIAGNOSIS — R109 Unspecified abdominal pain: Secondary | ICD-10-CM | POA: Insufficient documentation

## 2018-04-12 DIAGNOSIS — M545 Low back pain, unspecified: Secondary | ICD-10-CM

## 2018-04-12 DIAGNOSIS — F17210 Nicotine dependence, cigarettes, uncomplicated: Secondary | ICD-10-CM | POA: Diagnosis not present

## 2018-04-12 DIAGNOSIS — N200 Calculus of kidney: Secondary | ICD-10-CM | POA: Diagnosis not present

## 2018-04-12 DIAGNOSIS — M5126 Other intervertebral disc displacement, lumbar region: Secondary | ICD-10-CM | POA: Diagnosis not present

## 2018-04-12 DIAGNOSIS — Z79899 Other long term (current) drug therapy: Secondary | ICD-10-CM | POA: Diagnosis not present

## 2018-04-12 DIAGNOSIS — B20 Human immunodeficiency virus [HIV] disease: Secondary | ICD-10-CM | POA: Diagnosis not present

## 2018-04-12 DIAGNOSIS — I252 Old myocardial infarction: Secondary | ICD-10-CM | POA: Diagnosis not present

## 2018-04-12 DIAGNOSIS — M5441 Lumbago with sciatica, right side: Secondary | ICD-10-CM | POA: Insufficient documentation

## 2018-04-12 DIAGNOSIS — Z7982 Long term (current) use of aspirin: Secondary | ICD-10-CM | POA: Diagnosis not present

## 2018-04-12 DIAGNOSIS — M5440 Lumbago with sciatica, unspecified side: Secondary | ICD-10-CM | POA: Diagnosis not present

## 2018-04-12 DIAGNOSIS — I10 Essential (primary) hypertension: Secondary | ICD-10-CM | POA: Insufficient documentation

## 2018-04-12 DIAGNOSIS — K8020 Calculus of gallbladder without cholecystitis without obstruction: Secondary | ICD-10-CM | POA: Diagnosis not present

## 2018-04-12 LAB — CBC WITH DIFFERENTIAL/PLATELET
BASOS PCT: 0 %
Basophils Absolute: 0 10*3/uL (ref 0–0.1)
Eosinophils Absolute: 0.1 10*3/uL (ref 0–0.7)
Eosinophils Relative: 1 %
HEMATOCRIT: 40.3 % (ref 35.0–47.0)
HEMOGLOBIN: 13.7 g/dL (ref 12.0–16.0)
LYMPHS ABS: 2.7 10*3/uL (ref 1.0–3.6)
Lymphocytes Relative: 39 %
MCH: 32.5 pg (ref 26.0–34.0)
MCHC: 34 g/dL (ref 32.0–36.0)
MCV: 95.6 fL (ref 80.0–100.0)
MONOS PCT: 8 %
Monocytes Absolute: 0.6 10*3/uL (ref 0.2–0.9)
NEUTROS ABS: 3.7 10*3/uL (ref 1.4–6.5)
NEUTROS PCT: 52 %
Platelets: 249 10*3/uL (ref 150–440)
RBC: 4.22 MIL/uL (ref 3.80–5.20)
RDW: 15.5 % — ABNORMAL HIGH (ref 11.5–14.5)
WBC: 7.1 10*3/uL (ref 3.6–11.0)

## 2018-04-12 LAB — COMPREHENSIVE METABOLIC PANEL
ALBUMIN: 3.8 g/dL (ref 3.5–5.0)
ALK PHOS: 120 U/L (ref 38–126)
ALT: 15 U/L (ref 14–54)
ANION GAP: 12 (ref 5–15)
AST: 23 U/L (ref 15–41)
BILIRUBIN TOTAL: 0.5 mg/dL (ref 0.3–1.2)
BUN: 19 mg/dL (ref 6–20)
CALCIUM: 9.6 mg/dL (ref 8.9–10.3)
CO2: 24 mmol/L (ref 22–32)
CREATININE: 0.78 mg/dL (ref 0.44–1.00)
Chloride: 100 mmol/L — ABNORMAL LOW (ref 101–111)
GFR calc Af Amer: >60 mL/min (ref >60)
GFR calc non Af Amer: >60 mL/min (ref >60)
GLUCOSE: 108 mg/dL — AB (ref 65–99)
Potassium: 2.9 mmol/L — ABNORMAL LOW (ref 3.5–5.1)
Sodium: 136 mmol/L (ref 135–145)
TOTAL PROTEIN: 8 g/dL (ref 6.5–8.1)

## 2018-04-12 MED ORDER — POTASSIUM CHLORIDE 10 MEQ/100ML IV SOLN
10.0000 meq | Freq: Once | INTRAVENOUS | Status: AC
  Administered 2018-04-12: 10 meq via INTRAVENOUS
  Filled 2018-04-12: qty 100

## 2018-04-12 MED ORDER — POTASSIUM CHLORIDE CRYS ER 20 MEQ PO TBCR: 20.0000 meq | EXTENDED_RELEASE_TABLET | Freq: Once | ORAL | 0 refills | Status: DC

## 2018-04-12 MED ORDER — OXYCODONE-ACETAMINOPHEN 5-325 MG PO TABS: 1.0000 | ORAL_TABLET | ORAL | 0 refills | Status: DC | PRN

## 2018-04-12 MED ORDER — MORPHINE SULFATE (PF) 4 MG/ML IV SOLN
4.0000 mg | Freq: Once | INTRAVENOUS | Status: AC
  Administered 2018-04-12: 4 mg via INTRAVENOUS
  Filled 2018-04-12: qty 1

## 2018-04-12 MED ORDER — POTASSIUM CHLORIDE CRYS ER 20 MEQ PO TBCR
40.0000 meq | EXTENDED_RELEASE_TABLET | Freq: Once | ORAL | Status: AC
  Administered 2018-04-12: 40 meq via ORAL
  Filled 2018-04-12: qty 2

## 2018-04-12 MED ORDER — IOPAMIDOL (ISOVUE-370) INJECTION 76%
75.0000 mL | Freq: Once | INTRAVENOUS | Status: AC | PRN
  Administered 2018-04-12: 100 mL via INTRAVENOUS
  Filled 2018-04-12: qty 75

## 2018-04-12 MED ORDER — ONDANSETRON HCL 4 MG/2ML IJ SOLN
4.0000 mg | Freq: Once | INTRAMUSCULAR | Status: AC
  Administered 2018-04-12: 4 mg via INTRAVENOUS
  Filled 2018-04-12: qty 2

## 2018-04-12 NOTE — ED Provider Notes (Signed)
[facility] Emergency Department Provider Note  ____________________________________________   First MD Initiated Contact with Patient 04/12/18 1011     (approximate)  I have reviewed the triage vital signs and the nursing notes.   HISTORY  Chief Complaint Back Pain    HPI Lisa Hawkins is a 67 y.o. female tends to the emergency department complaining of low back pain.  States pain radiates from the lower back into the abdomen and some times down into the right leg.  She states she is having excruciating pain.  She denies any known injury.  She has a history of disc problems.  She denies any vomiting.  She denies central abdominal pain.  She denies any dizziness.  Patient's past medical history is listed below  Past Medical History:  Diagnosis Date  . Anxiety   . Arthritis   . Bronchitis   . Complication of anesthesia    vomitting  . Dysrhythmia    hx of flutter, no longer present  . Elevated lipids   . GERD (gastroesophageal reflux disease)    does not take meds but has gerd pretty bad  . Hepatitis    Hep C - treated with Harvoni  . HIV (human immunodeficiency virus infection) ([place])   . HOH (hard of hearing)   . Hypertension   . Myocardial infarction ([place]) 1993  . Neuropathy   . Seizures ([place])    not considered seizures but does "black out" and unsure of what happened. happens when she overheats. last episode 4 days ago.  . Stroke [facility])    no defecits    Patient Active Problem List   Diagnosis Date Noted  . Altered mental status 10/28/2016  . Wheezing 10/28/2016  . Dyspnea 10/28/2016  . Acute respiratory failure with hypoxia ([place]) 10/28/2016  . Hyponatremia 10/28/2016  . Hepatomegaly 10/28/2016  . Primary localized osteoarthritis of left knee 10/26/2016  . Primary localized osteoarthritis of right knee 07/15/2016    Past Surgical History:  Procedure Laterality Date  . ABDOMINAL HYSTERECTOMY  1970  . BACK SURGERY     no metal    . CATARACT EXTRACTION W/PHACO Right 06/21/2017   Procedure: CATARACT EXTRACTION PHACO AND INTRAOCULAR LENS PLACEMENT (IOC);  Surgeon: Birder Robson, MD;  Location: [facility] ORS;  Service: Ophthalmology;  Laterality: Right;  [place] 00:30.9 AP% 12.7 CDE 3.94 Fluid Pack Lot # [id number]  . CATARACT EXTRACTION W/PHACO Left 07/12/2017   Procedure: CATARACT EXTRACTION PHACO AND INTRAOCULAR LENS PLACEMENT ([place]);  Surgeon: Birder Robson, MD;  Location: [facility] ORS;  Service: Ophthalmology;  Laterality: Left;  [place]  00:42 AP% 15.2 CDE 6.38 Fluid pack lot # [id number]  . DIAGNOSTIC LAPAROSCOPY    . FRACTURE SURGERY Left    wrist  . HERNIA REPAIR  7062   umbilical  . JOINT REPLACEMENT Bilateral   . KNEE CLOSED REDUCTION Left 12/30/2016   Procedure: CLOSED MANIPULATION KNEE;  Surgeon: Hessie Knows, MD;  Location: [facility] ORS;  Service: Orthopedics;  Laterality: Left;  . LAPAROTOMY    . TONSILLECTOMY    . TOTAL KNEE ARTHROPLASTY Right 07/15/2016   Procedure: TOTAL KNEE ARTHROPLASTY;  Surgeon: Hessie Knows, MD;  Location: [facility] ORS;  Service: Orthopedics;  Laterality: Right;  . TOTAL KNEE ARTHROPLASTY Left 10/26/2016   Procedure: TOTAL KNEE ARTHROPLASTY;  Surgeon: Hessie Knows, MD;  Location: [facility] ORS;  Service: Orthopedics;  Laterality: Left;    Prior to Admission medications   Medication Sig Start Date End Date Taking? Authorizing Provider  amitriptyline (ELAVIL)  25 MG tablet Take 75 mg by mouth at bedtime.  05/25/16   [provider]  amLODipine (NORVASC) 10 MG tablet Take 10 mg by mouth at bedtime. 05/25/16   [provider]  aspirin EC 81 MG tablet Take 81 mg by mouth at bedtime.    [provider]  citalopram (CELEXA) 10 MG tablet Take 10 mg by mouth at bedtime. 04/02/16   [provider]  gabapentin (NEURONTIN) 300 MG capsule Take 900 mg by mouth 2 (two) times daily. 03/29/16   [provider]  hydrochlorothiazide (HYDRODIURIL) 25 MG tablet Take 25 mg by mouth at  bedtime. 05/25/16   [provider]  levofloxacin (LEVAQUIN) 500 MG tablet Take 1 tablet (500 mg total) by mouth daily. For 3 days Patient not taking: Reported on 12/30/2016 10/29/16   Vaughan Basta, MD  mirtazapine (REMERON) 30 MG tablet Take 30 mg by mouth at bedtime. 03/30/16   [provider]  oxyCODONE (OXY IR/ROXICODONE) 5 MG immediate release tablet Take 1 tablet (5 mg total) by mouth every 6 (six) hours as needed. Patient not taking: Reported on 06/20/2017 10/28/16   Reche Dixon, PA-C  oxyCODONE (OXYCONTIN) 15 mg 12 hr tablet Take 1 tablet (15 mg total) by mouth every 12 (twelve) hours. Patient not taking: Reported on 12/30/2016 10/28/16   Reche Dixon, PA-C  paroxetine mesylate (PEXEVA) 40 MG tablet Take 40 mg by mouth at bedtime.    [provider]  Tapentadol HCl 100 MG TABS Take 1 tablet (100 mg total) by mouth every 4 (four) hours as needed. Patient not taking: Reported on 12/30/2016 10/28/16   Reche Dixon, PA-C  traMADol (ULTRAM) 50 MG tablet Take 1 tablet (50 mg total) by mouth every 6 (six) hours. Patient not taking: Reported on 12/30/2016 10/29/16   Reche Dixon, PA-C  TRIUMEQ 600-50-300 MG tablet Take 1 tablet by mouth at bedtime. 04/28/16   [provider]    Allergies Penicillins  Family History  Problem Relation Age of Onset  . Hypertension Father     Social History Social History   Tobacco Use  . Smoking status: Current Every Day Smoker    Packs/day: 0.50    Years: 45.00    Pack years: 22.50    Types: Cigarettes  . Smokeless tobacco: Never Used  Substance Use Topics  . Alcohol use: No  . Drug use: No    Review of Systems  Constitutional: No fever/chills Eyes: No visual changes. ENT: No sore throat. Respiratory: Denies cough Genitourinary: Negative for dysuria. Gastrointestinal: Positive for abdominal pain Musculoskeletal: Positive for back pain. Skin: Negative for  rash.    ____________________________________________   PHYSICAL EXAM:  VITAL SIGNS: ED Triage Vitals  Enc Vitals Group     BP 04/12/18 0937 (!) 176/81     Pulse Rate 04/12/18 0937 84     Resp 04/12/18 0937 20     Temp 04/12/18 0937 98.4 F (36.9 C)     Temp Source 04/12/18 0937 Oral     SpO2 04/12/18 0937 99 %     Weight 04/12/18 0939 230 lb (104.3 kg)     Height 04/12/18 0939 5\' 3"  (1.6 m)     Head Circumference --      Peak Flow --      Pain Score 04/12/18 0939 10     Pain Loc --      Pain Edu? --      Excl. in [place]? --     Constitutional: Alert  and oriented. Well appearing and in moderate distress due to pain Eyes: Conjunctivae are normal.  Head: Atraumatic. Nose: No congestion/rhinnorhea. Mouth/Throat: Mucous membranes are moist.   Cardiovascular: Normal rate, regular rhythm.  Heart sounds are normal Respiratory: Normal respiratory effort.  No retractions, lungs clear to auscultation Abdomen is soft, nontender, no pulsatile masses noted GU: deferred Musculoskeletal: Lumbar spine is tender right hip is little tender patient cannot move without crying.  She is able to lift great toes bilaterally. Neurologic:  Normal speech and language.  Neurovascular is intact Skin:  Skin is warm, dry and intact. No rash noted. Psychiatric: Mood and affect are normal. Speech and behavior are normal.  ____________________________________________   LABS (all labs ordered are listed, but only abnormal results are displayed)  Labs Reviewed  CBC WITH DIFFERENTIAL/PLATELET - Abnormal; Notable for the following components:      Result Value   RDW 15.5 (*)    All other components within normal limits  COMPREHENSIVE METABOLIC PANEL - Abnormal; Notable for the following components:   Potassium 2.9 (*)    Chloride 100 (*)    Glucose, Bld 108 (*)    All other components within normal limits    ____________________________________________   ____________________________________________  RADIOLOGY  CT Angie of the abdomen and pelvis to assess for aortic aneurysm and lumbar spine Both are negative for any acute abnormality ____________________________________________   PROCEDURES  Procedure(s) performed: Saline lock, morphine 4 mg IV.  Potassium 10 M EQ's IV, potassium 40 M EQ's p.o.  Procedures    ____________________________________________   INITIAL IMPRESSION / ASSESSMENT AND PLAN / ED COURSE  Pertinent labs & imaging results that were available during my care of the patient were reviewed by me and considered in my medical decision making (see chart for details).  Patient is a 67 year old female presents emergency department with severe low back pain.  She states she has a history of disc problems and cannot get the pain under control.  She is crying in pain.  She states the pain does radiate into the flank area.  On physical exam the patient is crying and whimpering due to the pain.  The lumbar spine is tender to palpation in the right lower quadrant was slightly tender to palpation.  Neurovascular is intact.  She is able to lift both great toes with good strength.  CT angios pelvis and abdomen was ordered to be able to assess the aorta and the lumbar spine.  ----------------------------------------- 5:34 PM on 04/12/2018 -----------------------------------------  CT angios pelvis and abdomen are both negative.  CT results were explained to the patient.  Discussed the hypokalemia with Dr. Alfred Levins.  She instructed me to give potassium 10 M EQ's IV and potassium 40 M EQ's p.o.  She states it would be okay to discharge the patient after these medications.  All of this was explained to the patient.  She was transferred over to see pod so she will be placed on a monitor prior to the IV infusion.  After the infusion the nurse from [facility] called to tell me that the  patient was stable and ready to go home.  Discharge papers and prescriptions were given to the nurse in C pod.  Explained to the patient that she is to take the potassium by mouth starting tomorrow.  She is to follow-up with her regular doctor in 1 week to have her potassium levels rechecked.  She is to return to the emergency department if back pain is worsening.  She  was given a prescription for Percocet.  She is to follow-up with the regular doctor who follows her for back pain.  She states she understands to comply with our instructions.  She was discharged in stable condition   As part of my medical decision making, I reviewed the following data within the [facility] notes reviewed and incorporated, Labs reviewed potassium is 2.9, Old chart reviewed, Radiograph reviewed CT is negative, Notes from prior ED visits and  Controlled Substance Database  ____________________________________________   FINAL CLINICAL IMPRESSION(S) / ED DIAGNOSES  Final diagnoses:  Acute right-sided low back pain with sciatica, sciatica laterality unspecified  Hypokalemia      NEW MEDICATIONS STARTED DURING THIS VISIT:  New Prescriptions   No medications on file     Note:  This document was prepared using Dragon voice recognition software and may include unintentional dictation errors.    Versie Starks, PA-C 04/12/18 [place], [place], MD 04/15/18 [phone]

## 2018-04-12 NOTE — ED Notes (Signed)
See triage note  Presents with lower back pain  States she has disc problems     Pain is just getting worse  Increased pain  With movement  No urinary sx's

## 2018-04-12 NOTE — Discharge Instructions (Addendum)
Follow-up with your regular doctor for a recheck of your potassium in 1 to 2 weeks.  Continue take medication as prescribed.  If you are worsening please return to the emergency department.

## 2018-04-12 NOTE — ED Triage Notes (Signed)
Pt states 3 discs in her lower back have been bothering her, has been taking aspirin at home for the pain, states pain travels down right leg. Appears in no distress.

## 2018-05-19 ENCOUNTER — Ambulatory Visit: Payer: Self-pay | Admitting: Internal Medicine

## 2018-05-19 DIAGNOSIS — Z0289 Encounter for other administrative examinations: Secondary | ICD-10-CM

## 2018-09-08 DIAGNOSIS — M4316 Spondylolisthesis, lumbar region: Secondary | ICD-10-CM | POA: Diagnosis not present

## 2018-09-08 DIAGNOSIS — M48062 Spinal stenosis, lumbar region with neurogenic claudication: Secondary | ICD-10-CM | POA: Diagnosis not present

## 2018-09-08 DIAGNOSIS — M5441 Lumbago with sciatica, right side: Secondary | ICD-10-CM | POA: Diagnosis not present

## 2018-09-08 DIAGNOSIS — M5136 Other intervertebral disc degeneration, lumbar region: Secondary | ICD-10-CM | POA: Diagnosis not present

## 2018-09-08 DIAGNOSIS — M47816 Spondylosis without myelopathy or radiculopathy, lumbar region: Secondary | ICD-10-CM | POA: Diagnosis not present

## 2018-09-08 DIAGNOSIS — M5416 Radiculopathy, lumbar region: Secondary | ICD-10-CM | POA: Diagnosis not present

## 2018-09-11 ENCOUNTER — Other Ambulatory Visit: Payer: Self-pay | Admitting: Orthopedic Surgery

## 2018-09-11 DIAGNOSIS — M5441 Lumbago with sciatica, right side: Secondary | ICD-10-CM

## 2018-09-26 DIAGNOSIS — B20 Human immunodeficiency virus [HIV] disease: Secondary | ICD-10-CM | POA: Diagnosis not present

## 2018-09-26 DIAGNOSIS — K7469 Other cirrhosis of liver: Secondary | ICD-10-CM | POA: Diagnosis not present

## 2018-09-26 DIAGNOSIS — Z23 Encounter for immunization: Secondary | ICD-10-CM | POA: Diagnosis not present

## 2018-09-26 DIAGNOSIS — R52 Pain, unspecified: Secondary | ICD-10-CM | POA: Diagnosis not present

## 2018-09-26 DIAGNOSIS — B1920 Unspecified viral hepatitis C without hepatic coma: Secondary | ICD-10-CM | POA: Diagnosis not present

## 2018-09-26 DIAGNOSIS — Z0000 Encounter for general adult medical examination without abnormal findings: Secondary | ICD-10-CM | POA: Diagnosis not present

## 2018-10-11 DIAGNOSIS — B1920 Unspecified viral hepatitis C without hepatic coma: Secondary | ICD-10-CM | POA: Diagnosis not present

## 2018-10-11 DIAGNOSIS — Z0000 Encounter for general adult medical examination without abnormal findings: Secondary | ICD-10-CM | POA: Diagnosis not present

## 2018-10-11 DIAGNOSIS — B20 Human immunodeficiency virus [HIV] disease: Secondary | ICD-10-CM | POA: Diagnosis not present

## 2018-10-11 DIAGNOSIS — M8588 Other specified disorders of bone density and structure, other site: Secondary | ICD-10-CM | POA: Diagnosis not present

## 2018-10-11 DIAGNOSIS — K7469 Other cirrhosis of liver: Secondary | ICD-10-CM | POA: Diagnosis not present

## 2018-10-11 DIAGNOSIS — Z780 Asymptomatic menopausal state: Secondary | ICD-10-CM | POA: Diagnosis not present

## 2019-05-09 DIAGNOSIS — H2000 Unspecified acute and subacute iridocyclitis: Secondary | ICD-10-CM | POA: Diagnosis not present

## 2019-05-29 DIAGNOSIS — R799 Abnormal finding of blood chemistry, unspecified: Secondary | ICD-10-CM | POA: Diagnosis not present

## 2019-05-29 DIAGNOSIS — K7469 Other cirrhosis of liver: Secondary | ICD-10-CM | POA: Diagnosis not present

## 2019-05-29 DIAGNOSIS — G629 Polyneuropathy, unspecified: Secondary | ICD-10-CM | POA: Diagnosis not present

## 2019-05-29 DIAGNOSIS — I10 Essential (primary) hypertension: Secondary | ICD-10-CM | POA: Diagnosis not present

## 2019-05-29 DIAGNOSIS — R569 Unspecified convulsions: Secondary | ICD-10-CM | POA: Diagnosis not present

## 2019-05-29 DIAGNOSIS — B20 Human immunodeficiency virus [HIV] disease: Secondary | ICD-10-CM | POA: Diagnosis not present

## 2019-05-29 DIAGNOSIS — B1920 Unspecified viral hepatitis C without hepatic coma: Secondary | ICD-10-CM | POA: Diagnosis not present

## 2019-06-13 DIAGNOSIS — B20 Human immunodeficiency virus [HIV] disease: Secondary | ICD-10-CM | POA: Diagnosis not present

## 2019-06-13 DIAGNOSIS — K8020 Calculus of gallbladder without cholecystitis without obstruction: Secondary | ICD-10-CM | POA: Diagnosis not present

## 2019-06-13 DIAGNOSIS — N200 Calculus of kidney: Secondary | ICD-10-CM | POA: Diagnosis not present

## 2019-06-13 DIAGNOSIS — K7469 Other cirrhosis of liver: Secondary | ICD-10-CM | POA: Diagnosis not present

## 2019-06-13 DIAGNOSIS — K7689 Other specified diseases of liver: Secondary | ICD-10-CM | POA: Diagnosis not present

## 2019-06-13 DIAGNOSIS — B1920 Unspecified viral hepatitis C without hepatic coma: Secondary | ICD-10-CM | POA: Diagnosis not present

## 2019-07-09 DIAGNOSIS — H2000 Unspecified acute and subacute iridocyclitis: Secondary | ICD-10-CM | POA: Diagnosis not present

## 2019-08-20 DIAGNOSIS — R69 Illness, unspecified: Secondary | ICD-10-CM | POA: Diagnosis not present

## 2019-10-09 DIAGNOSIS — A6000 Herpesviral infection of urogenital system, unspecified: Secondary | ICD-10-CM | POA: Diagnosis not present

## 2019-10-09 DIAGNOSIS — S46212A Strain of muscle, fascia and tendon of other parts of biceps, left arm, initial encounter: Secondary | ICD-10-CM | POA: Diagnosis not present

## 2019-10-09 DIAGNOSIS — Z1389 Encounter for screening for other disorder: Secondary | ICD-10-CM | POA: Diagnosis not present

## 2019-10-09 DIAGNOSIS — L209 Atopic dermatitis, unspecified: Secondary | ICD-10-CM | POA: Diagnosis not present

## 2019-10-09 DIAGNOSIS — Z6841 Body Mass Index (BMI) 40.0 and over, adult: Secondary | ICD-10-CM | POA: Diagnosis not present

## 2019-10-09 DIAGNOSIS — B20 Human immunodeficiency virus [HIV] disease: Secondary | ICD-10-CM | POA: Diagnosis not present

## 2019-10-09 DIAGNOSIS — Z21 Asymptomatic human immunodeficiency virus [HIV] infection status: Secondary | ICD-10-CM | POA: Diagnosis not present

## 2019-10-09 DIAGNOSIS — I10 Essential (primary) hypertension: Secondary | ICD-10-CM | POA: Diagnosis not present

## 2019-12-10 DIAGNOSIS — R69 Illness, unspecified: Secondary | ICD-10-CM | POA: Diagnosis not present

## 2020-01-22 DIAGNOSIS — M5441 Lumbago with sciatica, right side: Secondary | ICD-10-CM | POA: Diagnosis not present

## 2020-01-22 DIAGNOSIS — M47816 Spondylosis without myelopathy or radiculopathy, lumbar region: Secondary | ICD-10-CM | POA: Diagnosis not present

## 2020-01-22 DIAGNOSIS — M5442 Lumbago with sciatica, left side: Secondary | ICD-10-CM | POA: Diagnosis not present

## 2020-01-22 DIAGNOSIS — R69 Illness, unspecified: Secondary | ICD-10-CM | POA: Diagnosis not present

## 2020-01-22 DIAGNOSIS — M5416 Radiculopathy, lumbar region: Secondary | ICD-10-CM | POA: Diagnosis not present

## 2020-01-22 DIAGNOSIS — M4807 Spinal stenosis, lumbosacral region: Secondary | ICD-10-CM | POA: Diagnosis not present

## 2020-01-22 DIAGNOSIS — M4316 Spondylolisthesis, lumbar region: Secondary | ICD-10-CM | POA: Diagnosis not present

## 2020-01-22 DIAGNOSIS — M5136 Other intervertebral disc degeneration, lumbar region: Secondary | ICD-10-CM | POA: Diagnosis not present

## 2020-01-23 ENCOUNTER — Other Ambulatory Visit: Payer: Self-pay | Admitting: Orthopedic Surgery

## 2020-01-23 DIAGNOSIS — M5442 Lumbago with sciatica, left side: Secondary | ICD-10-CM

## 2020-01-23 DIAGNOSIS — M4807 Spinal stenosis, lumbosacral region: Secondary | ICD-10-CM

## 2020-01-23 DIAGNOSIS — M5441 Lumbago with sciatica, right side: Secondary | ICD-10-CM

## 2020-01-23 DIAGNOSIS — M4316 Spondylolisthesis, lumbar region: Secondary | ICD-10-CM

## 2020-02-04 ENCOUNTER — Other Ambulatory Visit: Payer: Self-pay

## 2020-02-04 ENCOUNTER — Ambulatory Visit
Admission: RE | Admit: 2020-02-04 | Discharge: 2020-02-04 | Disposition: A | Discharge status: Home / Self Care | Payer: Medicare HMO | Source: Ambulatory Visit | Attending: Orthopedic Surgery | Admitting: Orthopedic Surgery

## 2020-02-04 DIAGNOSIS — M5441 Lumbago with sciatica, right side: Secondary | ICD-10-CM | POA: Diagnosis not present

## 2020-02-04 DIAGNOSIS — M4807 Spinal stenosis, lumbosacral region: Secondary | ICD-10-CM | POA: Diagnosis not present

## 2020-02-04 DIAGNOSIS — M545 Low back pain: Secondary | ICD-10-CM | POA: Diagnosis not present

## 2020-02-04 DIAGNOSIS — M5442 Lumbago with sciatica, left side: Secondary | ICD-10-CM | POA: Insufficient documentation

## 2020-02-04 DIAGNOSIS — M4316 Spondylolisthesis, lumbar region: Secondary | ICD-10-CM | POA: Insufficient documentation

## 2020-02-18 ENCOUNTER — Ambulatory Visit: Payer: Medicare HMO | Attending: Internal Medicine

## 2020-02-18 DIAGNOSIS — Z23 Encounter for immunization: Secondary | ICD-10-CM

## 2020-02-18 NOTE — Progress Notes (Signed)
   Covid-19 Vaccination Clinic  Name:  Lisa Hawkins    MRN: [id number] DOB: November 03, 1951  02/18/2020  [name] was observed post Covid-19 immunization for 30 minutes based on pre-vaccination screening without incident. She was provided with Vaccine Information Sheet and instruction to access the V-Safe system.   [name] was instructed to call 911 with any severe reactions post vaccine: Mar[place] Kitchen Difficulty breathing  . Swelling of face and throat  . A fast heartbeat  . A bad rash all over body  . Dizziness and weakness   Immunizations Administered    Name Date Dose VIS Date Route   Pfizer COVID-19 Vaccine 02/18/2020  9:17 AM 0.3 mL 11/09/2019 Intramuscular   Manufacturer: [place]   Lot: [id number]   [place]: [id number]

## 2020-02-27 DIAGNOSIS — Z6841 Body Mass Index (BMI) 40.0 and over, adult: Secondary | ICD-10-CM | POA: Diagnosis not present

## 2020-02-27 DIAGNOSIS — R159 Full incontinence of feces: Secondary | ICD-10-CM | POA: Diagnosis not present

## 2020-02-27 DIAGNOSIS — G43909 Migraine, unspecified, not intractable, without status migrainosus: Secondary | ICD-10-CM | POA: Diagnosis not present

## 2020-03-10 ENCOUNTER — Ambulatory Visit: Payer: Medicare HMO | Attending: Internal Medicine

## 2020-03-10 DIAGNOSIS — Z23 Encounter for immunization: Secondary | ICD-10-CM

## 2020-03-10 NOTE — Progress Notes (Signed)
   Covid-19 Vaccination Clinic  Name:  Lisa Hawkins    MRN: [id number] DOB: 07-Sep-1951  03/10/2020  [name] was observed post Covid-19 immunization for 15 minutes without incident. She was provided with Vaccine Information Sheet and instruction to access the V-Safe system.   [name] was instructed to call 911 with any severe reactions post vaccine: Marland Kitchen Difficulty breathing  . Swelling of face and throat  . A fast heartbeat  . A bad rash all over body  . Dizziness and weakness   Immunizations Administered    Name Date Dose VIS Date Route   Pfizer COVID-19 Vaccine 03/10/2020  9:05 AM 0.3 mL 11/09/2019 Intramuscular   Manufacturer: [place]   Lot: [id number]   [place]: [id number]

## 2020-03-19 DIAGNOSIS — M5126 Other intervertebral disc displacement, lumbar region: Secondary | ICD-10-CM | POA: Diagnosis not present

## 2020-03-19 DIAGNOSIS — M5416 Radiculopathy, lumbar region: Secondary | ICD-10-CM | POA: Diagnosis not present

## 2020-04-16 DIAGNOSIS — M47816 Spondylosis without myelopathy or radiculopathy, lumbar region: Secondary | ICD-10-CM | POA: Diagnosis not present

## 2020-04-16 DIAGNOSIS — M4807 Spinal stenosis, lumbosacral region: Secondary | ICD-10-CM | POA: Diagnosis not present

## 2020-04-16 DIAGNOSIS — M5136 Other intervertebral disc degeneration, lumbar region: Secondary | ICD-10-CM | POA: Diagnosis not present

## 2020-04-16 DIAGNOSIS — M5416 Radiculopathy, lumbar region: Secondary | ICD-10-CM | POA: Diagnosis not present

## 2020-04-16 DIAGNOSIS — M4316 Spondylolisthesis, lumbar region: Secondary | ICD-10-CM | POA: Diagnosis not present

## 2020-04-29 DIAGNOSIS — M5441 Lumbago with sciatica, right side: Secondary | ICD-10-CM | POA: Diagnosis not present

## 2020-04-29 DIAGNOSIS — M5442 Lumbago with sciatica, left side: Secondary | ICD-10-CM | POA: Diagnosis not present

## 2020-04-29 DIAGNOSIS — G8929 Other chronic pain: Secondary | ICD-10-CM | POA: Diagnosis not present

## 2020-05-18 DIAGNOSIS — Z79899 Other long term (current) drug therapy: Secondary | ICD-10-CM | POA: Insufficient documentation

## 2020-05-18 DIAGNOSIS — G894 Chronic pain syndrome: Secondary | ICD-10-CM | POA: Insufficient documentation

## 2020-05-18 DIAGNOSIS — M899 Disorder of bone, unspecified: Secondary | ICD-10-CM | POA: Insufficient documentation

## 2020-05-18 DIAGNOSIS — Z789 Other specified health status: Secondary | ICD-10-CM | POA: Insufficient documentation

## 2020-05-18 DIAGNOSIS — Z1211 Encounter for screening for malignant neoplasm of colon: Secondary | ICD-10-CM | POA: Insufficient documentation

## 2020-05-18 NOTE — Progress Notes (Signed)
Patient: Lisa Hawkins  Service Category: E/M  Provider: Gaspar Cola, MD  DOB: 05/24/1951  DOS: 05/19/2020  Referring Provider: Deetta Perla, MD  MRN: [id number]  Setting: Ambulatory outpatient  PCP: System, Provider Not In  Type: New Patient  Specialty: Interventional Pain Management    Location: Office  Delivery: Face-to-face     Primary Reason(s) for Visit: Encounter for initial evaluation of one or more chronic problems (new to examiner) potentially causing chronic pain, and posing a threat to normal musculoskeletal function. (Level of risk: High) CC: Back Pain  HPI  Lisa Hawkins is a 69 y.o. year old, female patient, who comes today to see [place] for the first time for an initial evaluation of her chronic pain. She has Osteoarthritis of knee (Right); Osteoarthritis of knee (Left); Altered mental status; Wheezing; Dyspnea; Acute respiratory failure with hypoxia ([place]); Hyponatremia; Hepatomegaly; Abnormal ECG; Chronic knee pain (Bilateral); Calcaneal spur; Cerebral artery occlusion with cerebral infarction ([place]); Compensated cirrhosis related to hepatitis C virus (HCV) ([place]); Depressive disorder; Displacement of lumbar intervertebral disc; Dizziness; Drug addiction syndrome ([place]); Hypertension; HIV (human immunodeficiency virus infection) ([place]); Kidney disease; Obesity; Onychomycosis; Tinea; PPD positive; Routine adult health maintenance; Tobacco use disorder; Trigger ring finger of left hand; Exertional shortness of breath; Osteoarthritis, knee (Left); Chronic pain syndrome; Pharmacologic therapy; Disorder of skeletal system; Problems influencing health status; Abnormal MRI, lumbar spine (02/04/2020); Kidney lesion (Left); Osteoarthritis of knee (Bilateral); Lumbar Grade 1 Anterolisthesis of L4/L5; Lumbar facet arthropathy (Multilevel) (Bilateral); Lumbar foraminal stenosis (Multilevel) (Bilateral); Lumbar lateral recess stenosis (L3-4) (Right); Lumbar central spinal stenosis w/o neurogenic  claudication (L4-5); DDD (degenerative disc disease), lumbosacral; Lumbosacral IVDD (intervertebral disc displacement); History of cocaine use; Chronic low back pain (1ry area of Pain) (Bilateral) (R>L) w/ sciatica (Bilateral); Chronic lower extremity pain (2ry area of Pain) (Bilateral) (R>L); Chronic knee pain after total knee replacement (Bilateral) (L>R); Chronic hip pain (3ry area of Pain) (Bilateral) (R>L); Chronic sacroiliac joint pain (Bilateral) (R>L); and Lumbar facet syndrome (Bilateral) (R>L) on their problem list. Today she comes in for evaluation of her Back Pain  Pain Assessment: Location: Lower Back Radiating: Pain radiaties down both leg and toes become numb Onset: More than a month ago Duration: Chronic pain Quality: Aching, Burning, Tightness, Throbbing, Pressure Severity: 9 /10 (subjective, self-reported pain score)  Note: Reported level is compatible with observation.                         When using our objective Pain Scale, levels between 6 and 10/10 are said to belong in an emergency room, as it progressively worsens from a 6/10, described as severely limiting, requiring emergency care not usually available at an outpatient pain management facility. At a 6/10 level, communication becomes difficult and requires great effort. Assistance to reach the emergency department may be required. Facial flushing and profuse sweating along with potentially dangerous increases in heart rate and blood pressure will be evident. Effect on ADL: limits her daily activities, no prolonged standing or sitting Timing: Constant Modifying factors: nothing BP: 119/75  HR: (!) 101  Onset and Duration: Sudden and Present longer than 3 months Cause of pain: Unknown Severity: Getting worse, NAS-11 at its worse: 10/10, NAS-11 at its best: 5/10, NAS-11 now: 9/10 and NAS-11 on the average: 9/10 Timing: Not influenced by the time of the day Aggravating Factors: Prolonged sitting, Prolonged standing,  Walking and Walking uphill Alleviating Factors: nothing Associated Problems: Fatigue, Numbness, Pain that wakes patient up  and Pain that does not allow patient to sleep Quality of Pain: Aching, Agonizing, Sharp and Throbbing Previous Examinations or Tests: CT scan, MRI scan and X-rays Previous Treatments: Epidural steroid injections and Narcotic medications  The patient comes into the clinics today for the first time for a chronic pain management evaluation.  According to the patient the primary area of pain is that of the lower back, bilaterally, (R>L).  The patient denies any back surgeries but does indicate having had physical therapy in the 1990s which did not help her back pain.  The patient indicates having been treated at the [facility] pain clinic with nerve blocks which apparently did help at the beginning but later on they quit helping.  She indicates having recently had an MRI of the lumbar spine.  This was reviewed today for this visit.  The patient's secondary area of pain is that of the lower extremities, bilaterally, (R>L).  Right lower extremity pain goes all the way down to the bottom of her foot and toes running through the back and turning into the front of the leg.  She indicates that her toes tingle.  In the case of the left lower extremity pain this is following the exact same distribution as the right, but just not as intense.  The patient's third area pain is that of the hips, bilaterally (R>L).  The patient denies any surgery but does indicate having had physical therapy for this pain around 1997-8.  She denies any joint injections or nerve blocks for the hip pain.  She indicates having had some x-rays by Dr. Arvella Nigh, none of which are available for review.  The fourth and final area of pain is that of the knees, bilaterally (L>R).  The patient indicates having had a bilateral total knee replacement that by Dr. Rudene Christians.   Today the patient requested "a shot" for the pain.  I  reviewed the available lab work and I went ahead and approved for the patient to have a Toradol 60 mg and Norflex 60 mg IM injection for the acute discomfort.  The physical exam today was positive for significant deconditioning, guarding, and generalized hypersensitivity.  The exam was also positive for lumbar facet disease and sacroiliac joint pain on provocative hyperextension on rotation and Patrick maneuver, respectively.  The patient also had positive bilateral hip joint pain on the Patrick maneuver.  Straight leg raise was very limited bilaterally but negative for radicular pain.  She is obese.  Today I took the time to provide the patient with information regarding my pain practice. The patient was informed that my practice is divided into two sections: an interventional pain management section, as well as a completely separate and distinct medication management section. I explained that I have procedure days for my interventional therapies, and evaluation days for follow-ups and medication management. Because of the amount of documentation required during both, they are kept separated. This means that there is the possibility that she may be scheduled for a procedure on one day, and medication management the next. I have also informed her that because of staffing and facility limitations, I no longer take patients for medication management only. To illustrate the reasons for this, I gave the patient the example of surgeons, and how inappropriate it would be to refer a patient to his/her care, just to write for the post-surgical antibiotics on a surgery done by a different surgeon.   Because interventional pain management is my board-certified specialty, the patient was informed  that joining my practice means that they are open to any and all interventional therapies. I made it clear that this does not mean that they will be forced to have any procedures done. What this means is that I believe  interventional therapies to be essential part of the diagnosis and proper management of chronic pain conditions. Therefore, patients not interested in these interventional alternatives will be better served under the care of a different practitioner.  Furthermore, because of the patient's history of drug addiction and documented history of repetitive urine drug screening test positive for cocaine, I made it very clear for the patient that our plan of care would not involve the use of any type of controlled substances.  The patient was also made aware of my Comprehensive Pain Management Safety Guidelines where by joining my practice, they limit all of their nerve blocks and joint injections to those done by our practice, for as long as we are retained to manage their care.   Historic Controlled Substance Pharmacotherapy Review  PMP and historical list of controlled substances: Hydrocodone/APAP 5/325 1 tablet p.o. 4 times daily (20 mg/day of hydrocodone) (20 MME/day); oxycodone/APAP 5/325; tramadol 50 mg Highest opioid analgesic regimen found: Oxycodone/APAP 5/325 3.5 tabs per day (26.25 MME) Most recent opioid analgesic: Hydrocodone/APAP 5/325 1 tablet p.o. 4 times daily (20 mg/day of hydrocodone) (20 MME) Current opioid analgesics:  None Highest recorded MME/day: 40 mg/day MME/day: 0 mg/day  Medications: The patient did not bring the medication(s) to the appointment, as requested in our "New Patient Package" Pharmacodynamics: Desired effects: Analgesia: The patient reports >50% benefit. Reported improvement in function: The patient reports medication allows her to accomplish basic ADLs. Clinically meaningful improvement in function (CMIF): Sustained CMIF goals met Perceived effectiveness: Described as relatively effective, allowing for increase in activities of daily living (ADL) Undesirable effects: Side-effects or Adverse reactions: None reported Historical Monitoring: The patient  reports  no history of drug use.  (Untrue) List of all UDS Test(s): No results found.  List of other Serum/Urine Drug Screening Test(s):  No results found. Historical Background Evaluation: [place] PMP: PDMP reviewed during this encounter. Six (6) year initial data search conducted.             PMP NARX Score Report:  Narcotic: 180 Sedative: 090 Stimulant: 000 [place] Department of public safety, offender search: Editor, commissioning Information) Non-contributory Risk Assessment Profile: Aberrant behavior: None observed or detected today Risk factors for fatal opioid overdose: None identified today PMP NARX Overdose Risk Score: 150 Fatal overdose hazard ratio (HR): Calculation deferred Non-fatal overdose hazard ratio (HR): Calculation deferred Risk of opioid abuse or dependence: 0.7-3.0% with doses ? 36 MME/day and 6.1-26% with doses ? 120 MME/day. Substance use disorder (SUD) risk level: See below Personal History of Substance Abuse (SUD-Substance use disorder):  Alcohol: Positive Female or Female  Illegal Drugs: Positive Female or Female  Rx Drugs: Negative  ORT Risk Level calculation: High Risk  Opioid Risk Tool - 05/19/20 0827      Family History of Substance Abuse   Alcohol Negative    Illegal Drugs Negative    Rx Drugs Negative      Personal History of Substance Abuse   Alcohol Positive Female    Illegal Drugs Positive Female    Rx Drugs Negative      Age   Age between 28-45 years  No      Psychological Disease   Psychological Disease Negative    Depression Positive  Total Score   Opioid Risk Tool Scoring 8    Opioid Risk Interpretation High Risk          ORT Scoring interpretation table:  Score <3 = Low Risk for SUD  Score between 4-7 = Moderate Risk for SUD  Score >8 = High Risk for Opioid Abuse   PHQ-2 Depression Scale:  Total score:    PHQ-2 Scoring interpretation table: (Score and probability of major depressive disorder)  Score 0 = No depression  Score 1 = 15.4% Probability   Score 2 = 21.1% Probability  Score 3 = 38.4% Probability  Score 4 = 45.5% Probability  Score 5 = 56.4% Probability  Score 6 = 78.6% Probability   PHQ-9 Depression Scale:  Total score:    PHQ-9 Scoring interpretation table:  Score 0-4 = No depression  Score 5-9 = Mild depression  Score 10-14 = Moderate depression  Score 15-19 = Moderately severe depression  Score 20-27 = Severe depression (2.4 times higher risk of SUD and 2.89 times higher risk of overuse)   Pharmacologic Plan: As per protocol, I have not taken over any controlled substance management, pending the results of ordered tests and/or consults.            Initial impression: Pending review of available data and ordered tests.  Meds   Current Outpatient Medications:  .  amitriptyline (ELAVIL) 25 MG tablet, Take 75 mg by mouth at bedtime. , Disp: , Rfl: 0 .  amLODipine (NORVASC) 10 MG tablet, Take 10 mg by mouth at bedtime., Disp: , Rfl: 0 .  aspirin EC 81 MG tablet, Take 81 mg by mouth at bedtime., Disp: , Rfl:  .  citalopram (CELEXA) 10 MG tablet, Take 10 mg by mouth at bedtime., Disp: , Rfl: 0 .  gabapentin (NEURONTIN) 300 MG capsule, Take 900 mg by mouth 2 (two) times daily., Disp: , Rfl: 0 .  hydrochlorothiazide (HYDRODIURIL) 25 MG tablet, Take 25 mg by mouth at bedtime., Disp: , Rfl: 0 .  mirtazapine (REMERON) 30 MG tablet, Take 30 mg by mouth at bedtime., Disp: , Rfl: 0 .  paroxetine mesylate (PEXEVA) 40 MG tablet, Take 40 mg by mouth at bedtime., Disp: , Rfl:  .  TRIUMEQ 600-50-300 MG tablet, Take 1 tablet by mouth at bedtime., Disp: , Rfl: 1 .  oxyCODONE-acetaminophen (PERCOCET/ROXICET) 5-325 MG tablet, Take 1 tablet by mouth every 4 (four) hours as needed for severe pain. (Patient not taking: Reported on 05/19/2020), Disp: 30 tablet, Rfl: 0 .  potassium chloride SA (K-DUR,KLOR-CON) 20 MEQ tablet, Take 1 tablet (20 mEq total) by mouth once for 1 dose., Disp: 30 tablet, Rfl: 0  Current Facility-Administered  Medications:  .  ketorolac (TORADOL) injection 60 mg, 60 mg, Intramuscular, Once, Milinda Pointer, MD .  orphenadrine (NORFLEX) injection 60 mg, 60 mg, Intramuscular, Once, Milinda Pointer, MD  Imaging Review  Lumbosacral Imaging: MR LUMBAR SPINE WO CONTRAST  Narrative CLINICAL DATA:  Chronic low back pain with bilateral leg pain.  EXAM: MRI LUMBAR SPINE WITHOUT CONTRAST  TECHNIQUE: Multiplanar, multisequence MR imaging of the lumbar spine was performed. No intravenous contrast was administered.  COMPARISON:  CT of the lumbar spine Apr 12, 2018  FINDINGS: Segmentation:  Standard.  Alignment: There is a grade 1 anterolisthesis of L4 over L5, unchanged.  Vertebrae:  No fracture, evidence of discitis, or bone lesion.  Conus medullaris and cauda equina: Conus extends to the L1 level. Conus and cauda equina appear normal.  Paraspinal and other  soft tissues: Round 1.2 cm lesion within the left kidney central low signal and peripheral high signal on T1 and high signal on T2 may represent a hemorrhagic cyst. Correlation with dedicated study recommended.  Disc levels:  T11-12: Small posterior disc protrusion causing small indentation of the thecal sac. No significant spinal canal or neural foraminal stenosis.  T12-L1: No spinal canal or neural foraminal stenosis.  L1-2: No spinal canal or neural foraminal stenosis.  L2-3: No spinal canal or neural foraminal stenosis.  L3-4: Loss of disc height, right asymmetric disc bulge and facet degenerative changes causing narrowing of the right subarticular zone and mild bilateral neural foraminal narrowing. There is no significant spinal canal stenosis.  L4-5: Mild loss of disc height, disc bulge, facet degenerative changes and ligamentum flavum hypertrophy resulting in mild spinal canal stenosis, moderate bilateral neural foraminal narrowing.  L5-S1: Disc bulge with superimposed ossified left central disc extrusion  migrating inferiorly and abutting the traversing left S1 nerve root. There also facet degenerative changes contributing for moderate bilateral neural foraminal narrowing.  IMPRESSION: 1. Disc bulge with superimposed left central disc extrusion at L5-S1 migrating inferiorly and abutting the traversing left S1 nerve root. 2. Moderate bilateral neural foraminal narrowing at L4-5 and L5-S1. 3. Round 1.2 cm lesion within the left kidney central low signal and peripheral high signal on T1 and high signal on T2 may represent a hemorrhagic cyst. Correlation with dedicated study recommended.   Electronically Signed By: Pedro Earls M.D. On: 02/04/2020 15:00  Knee Imaging: CT Knee Right Wo Contrast  Narrative CLINICAL DATA:  Right knee pain and swelling for 2 years. MY Knee preoperative planning examination. No known injury. Subsequent encounter.  EXAM: CT OF THE RIGHT KNEE WITHOUT CONTRAST  TECHNIQUE: Multidetector CT imaging of the right knee was performed according to the standard protocol. Multiplanar CT image reconstructions were also generated. Axial imaging only of the right hip and ankle was also performed.  COMPARISON:  Plain films right knee 12/23/2009.  FINDINGS: The patient has tricompartmental degenerative change about the right knee. Osteophytosis and joint space narrowing appear worst about the medial compartment. A loose body in the posterior aspect of the intercondylar notch medially measures 1.3 cm. No acute bony or joint abnormality is seen. Small joint effusion is noted.  Axial imaging of the right hip and ankle demonstrates no acute or focal abnormality. No notable degenerative disease is identified. Iliac atherosclerosis is noted.  IMPRESSION: Advanced osteoarthritis about the right knee appears worst medially. No acute or focal abnormality.   Electronically Signed By: Inge Rise M.D. On: 04/13/2016 12:20  CT KNEE LEFT WO  CONTRAST  Narrative CLINICAL DATA:  MY KNEE protocol exam. Patient for left knee replacement.  EXAM: CT OF THE LEFT KNEE WITHOUT CONTRAST  TECHNIQUE: Multidetector CT imaging of the left knee was performed according to the standard protocol. Multiplanar CT image reconstructions were also generated. Axial imaging only of the left hip and ankle was also performed.  COMPARISON:  None.  FINDINGS: The patient has degenerative disease about the left knee appearing worst medially where there is bone-on-bone joint space narrowing. No acute bony or joint abnormality is identified. Small to moderate joint effusion is noted. Axial imaging of the left hip demonstrates small subchondral cysts in the anterior acetabulum. The hip is otherwise normal in appearance. The left ankle is unremarkable. Atherosclerotic vascular disease is noted.  IMPRESSION: Advanced osteoarthritis about the left knee appears worst medially. No acute abnormality.   Electronically  Signed By: Inge Rise M.D. On: 08/31/2016 08:36  DG Knee 1-2 Views Right  Narrative CLINICAL DATA:  Postop day 0 after right total knee arthroplasty.  EXAM: RIGHT KNEE - 1-2 VIEW  COMPARISON:  None.  FINDINGS: Status post surgical changes of total knee arthroplasty. Hardware appears intact and appropriately positioned. Expected postsurgical changes within the overlying soft tissues.  IMPRESSION: Postsurgical changes of total knee arthroplasty. No evidence of surgical complicating feature.   Electronically Signed By: Franki Cabot M.D. On: 07/15/2016 16:44  DG Knee 1-2 Views Left  Narrative CLINICAL DATA:  Status post total knee replacement  EXAM: LEFT KNEE - 1-2 VIEW  COMPARISON:  Left knee CT August 30, 2016  FINDINGS: Frontal and lateral views were obtained. The patient is status post total knee replacement with femoral and tibial prosthetic components well-seated. No acute fracture or dislocation.  Air within the joint is an expected postoperative finding. There is a drain with its tip anterior to the patella.  IMPRESSION: Prosthetic components appear well seated. No acute fracture or dislocation.   Electronically Signed By: Lowella Grip III M.D. On: 10/26/2016 14:36  Complexity Note: Imaging results reviewed. Results shared with Ms. Amsden, using Layman's terms.                        ROS  Cardiovascular: Heart trouble, Daily Aspirin intake and High blood pressure Pulmonary or Respiratory: Lung problems, Wheezing and difficulty taking a deep full breath (Asthma), Difficulty blowing air out (Emphysema), Shortness of breath, Smoking and Snoring  Neurological: Seizure disorder Psychological-Psychiatric: No reported psychological or psychiatric signs or symptoms such as difficulty sleeping, anxiety, depression, delusions or hallucinations (schizophrenial), mood swings (bipolar disorders) or suicidal ideations or attempts Gastrointestinal: Reflux or heatburn Genitourinary: No reported renal or genitourinary signs or symptoms such as difficulty voiding or producing urine, peeing blood, non-functioning kidney, kidney stones, difficulty emptying the bladder, difficulty controlling the flow of urine, or chronic kidney disease Hematological: No reported hematological signs or symptoms such as prolonged bleeding, low or poor functioning platelets, bruising or bleeding easily, hereditary bleeding problems, low energy levels due to low hemoglobin or being anemic Endocrine: No reported endocrine signs or symptoms such as high or low blood sugar, rapid heart rate due to high thyroid levels, obesity or weight gain due to slow thyroid or thyroid disease Rheumatologic: No reported rheumatological signs and symptoms such as fatigue, joint pain, tenderness, swelling, redness, heat, stiffness, decreased range of motion, with or without associated rash Musculoskeletal: Negative for myasthenia gravis,  muscular dystrophy, multiple sclerosis or malignant hyperthermia Work History: Retired  Allergies  Ms. Deitrick is allergic to penicillins.  Laboratory Chemistry Profile   Renal Lab Results  Component Value Date   BUN 19 04/12/2018   CREATININE 0.78 04/12/2018   GFRAA >60 04/12/2018   GFRNONAA >60 04/12/2018   PROTEINUR NEGATIVE 10/27/2016     Electrolytes Lab Results  Component Value Date   NA 136 04/12/2018   K 2.9 (L) 04/12/2018   CL 100 (L) 04/12/2018   CALCIUM 9.6 04/12/2018     Hepatic Lab Results  Component Value Date   AST 23 04/12/2018   ALT 15 04/12/2018   ALBUMIN 3.8 04/12/2018   ALKPHOS 120 04/12/2018   LIPASE 81 12/28/2011   AMMONIA 36 (H) 10/28/2016     ID Lab Results  Component Value Date   STAPHAUREUS POSITIVE (A) 12/30/2016   MRSAPCR POSITIVE (A) 12/30/2016     Bone No  results found for: [place], [id number], [id number], [id number], [place], 25OHVITD2, 25OHVITD3, TESTOFREE, TESTOSTERONE   Endocrine Lab Results  Component Value Date   GLUCOSE 108 (H) 04/12/2018   GLUCOSEU NEGATIVE 10/27/2016     Neuropathy No results found for: VITAMINB12, FOLATE, HGBA1C, HIV   CNS No results found for: COLORCSF, APPEARCSF, RBCCOUNTCSF, WBCCSF, POLYSCSF, LYMPHSCSF, EOSCSF, PROTEINCSF, GLUCCSF, JCVIRUS, CSFOLI, IGGCSF, LABACHR, ACETBL, LABACHR, ACETBL   Inflammation (CRP: Acute  ESR: Chronic) Lab Results  Component Value Date   ESRSEDRATE 11 10/20/2016     Rheumatology No results found for: RF, ANA, LABURIC, URICUR, LYMEIGGIGMAB, LYMEABIGMQN, HLAB27   Coagulation Lab Results  Component Value Date   INR 1.10 10/20/2016   LABPROT 14.2 10/20/2016   APTT 30 10/20/2016   PLT 249 04/12/2018     Cardiovascular Lab Results  Component Value Date   HGB 13.7 04/12/2018   HCT 40.3 04/12/2018     Screening Lab Results  Component Value Date   STAPHAUREUS POSITIVE (A) 12/30/2016   MRSAPCR POSITIVE (A) 12/30/2016     Cancer No results found for:  CEA, CA125, LABCA2   Allergens No results found for: ALMOND, APPLE, ASPARAGUS, AVOCADO, BANANA, BARLEY, BASIL, BAYLE[place], GREENBEAN, LIMABEAN, WHITEBEAN, BEEFIGE, REDBEET, BLUEBERRY, BROCCOLI, CABBAGE, MELON, CARROT, CASEIN, CASHEWNUT, CAULIFLOWER, CELERY     Note: Lab results reviewed.   PFSH  Drug: Ms. Cabana  reports no history of drug use. Alcohol:  reports no history of alcohol use. Tobacco:  reports that she has been smoking cigarettes. She has a 22.50 pack-year smoking history. She has never used smokeless tobacco. Medical:  has a past medical history of Anxiety, Arthritis, Bronchitis, Chronic bilateral low back pain with bilateral sciatica (3/47/4259), Complication of anesthesia, Dysrhythmia, Elevated lipids, GERD (gastroesophageal reflux disease), Hepatitis, HIV (human immunodeficiency virus infection) ([place]), HOH (hard of hearing), Hypertension, Myocardial infarction ([place]) (1993), Neuropathy, Seizures ([place]), and Stroke ([place]). Family: family history includes Hypertension in her father.  Past Surgical History:  Procedure Laterality Date  . ABDOMINAL HYSTERECTOMY  1970  . BACK SURGERY     no metal  . CATARACT EXTRACTION W/PHACO Right 06/21/2017   Procedure: CATARACT EXTRACTION PHACO AND INTRAOCULAR LENS PLACEMENT (IOC);  Surgeon: Birder Robson, MD;  Location: [facility];  Service: Ophthalmology;  Laterality: Right;  [place] 00:30.9 AP% 12.7 CDE 3.94 Fluid Pack Lot # [id number]  . CATARACT EXTRACTION W/PHACO Left 07/12/2017   Procedure: CATARACT EXTRACTION PHACO AND INTRAOCULAR LENS PLACEMENT ([place]);  Surgeon: Birder Robson, MD;  Location: [facility];  Service: Ophthalmology;  Laterality: Left;  [place]  00:42 AP% 15.2 CDE 6.38 Fluid pack lot # [id number]  . DIAGNOSTIC LAPAROSCOPY    . FRACTURE SURGERY Left    wrist  . HERNIA REPAIR  4332   umbilical  . JOINT REPLACEMENT Bilateral   . KNEE CLOSED REDUCTION Left 12/30/2016   Procedure: CLOSED MANIPULATION KNEE;  Surgeon: Hessie Knows, MD;   Location: [facility];  Service: Orthopedics;  Laterality: Left;  . LAPAROTOMY    . TONSILLECTOMY    . TOTAL KNEE ARTHROPLASTY Right 07/15/2016   Procedure: TOTAL KNEE ARTHROPLASTY;  Surgeon: Hessie Knows, MD;  Location: [facility];  Service: Orthopedics;  Laterality: Right;  . TOTAL KNEE ARTHROPLASTY Left 10/26/2016   Procedure: TOTAL KNEE ARTHROPLASTY;  Surgeon: Hessie Knows, MD;  Location: [facility];  Service: Orthopedics;  Laterality: Left;   Active Ambulatory Problems    Diagnosis Date Noted  . Osteoarthritis of knee (Right) 07/15/2016  . Osteoarthritis of knee (Left) 10/26/2016  . Altered mental status  10/28/2016  . Wheezing 10/28/2016  . Dyspnea 10/28/2016  . Acute respiratory failure with hypoxia ([place]) 10/28/2016  . Hyponatremia 10/28/2016  . Hepatomegaly 10/28/2016  . Abnormal ECG 05/17/2016  . Chronic knee pain (Bilateral) 05/17/2016  . Calcaneal spur 02/26/2013  . Cerebral artery occlusion with cerebral infarction ([place]) 01/11/2006  . Compensated cirrhosis related to hepatitis C virus (HCV) ([place]) 12/22/2015  . Depressive disorder 08/11/1999  . Displacement of lumbar intervertebral disc 07/11/2006  . Dizziness 09/28/2013  . Drug addiction syndrome ([place]) 10/05/2011  . Hypertension 09/28/2013  . HIV (human immunodeficiency virus infection) ([place]) 05/06/1999  . Kidney disease 06/18/2014  . Obesity 09/28/2013  . Onychomycosis 09/28/2013  . Tinea 03/07/2014  . PPD positive 02/08/2004  . Routine adult health maintenance 11/26/2013  . Tobacco use disorder 09/30/2013  . Trigger ring finger of left hand 01/25/2017  . Exertional shortness of breath 03/30/2016  . Osteoarthritis, knee (Left) 09/28/2013  . Chronic pain syndrome 05/18/2020  . Pharmacologic therapy 05/18/2020  . Disorder of skeletal system 05/18/2020  . Problems influencing health status 05/18/2020  . Abnormal MRI, lumbar spine (02/04/2020) 05/19/2020  . Kidney lesion (Left) 05/19/2020  . Osteoarthritis of knee  (Bilateral) 05/19/2020  . Lumbar Grade 1 Anterolisthesis of L4/L5 05/19/2020  . Lumbar facet arthropathy (Multilevel) (Bilateral) 05/19/2020  . Lumbar foraminal stenosis (Multilevel) (Bilateral) 05/19/2020  . Lumbar lateral recess stenosis (L3-4) (Right) 05/19/2020  . Lumbar central spinal stenosis w/o neurogenic claudication (L4-5) 05/19/2020  . DDD (degenerative disc disease), lumbosacral 05/19/2020  . Lumbosacral IVDD (intervertebral disc displacement) 05/19/2020  . History of cocaine use 05/19/2020  . Chronic low back pain (1ry area of Pain) (Bilateral) (R>L) w/ sciatica (Bilateral) 05/19/2020  . Chronic lower extremity pain (2ry area of Pain) (Bilateral) (R>L) 05/19/2020  . Chronic knee pain after total knee replacement (Bilateral) (L>R) 05/19/2020  . Chronic hip pain (3ry area of Pain) (Bilateral) (R>L) 05/19/2020  . Chronic sacroiliac joint pain (Bilateral) (R>L) 05/19/2020  . Lumbar facet syndrome (Bilateral) (R>L) 05/19/2020   Resolved Ambulatory Problems    Diagnosis Date Noted  . No Resolved Ambulatory Problems   Past Medical History:  Diagnosis Date  . Anxiety   . Arthritis   . Bronchitis   . Complication of anesthesia   . Dysrhythmia   . Elevated lipids   . GERD (gastroesophageal reflux disease)   . Hepatitis   . HOH (hard of hearing)   . Myocardial infarction ([place]) 1993  . Neuropathy   . Seizures ([place])   . Stroke [facility])    Constitutional Exam  General appearance: Well nourished, well developed, and well hydrated. In no apparent acute distress Vitals:   05/19/20 0816  BP: 119/75  Pulse: (!) 101  Resp: 17  Temp: (!) 97.2 F (36.2 C)  TempSrc: Temporal  SpO2: 98%  Weight: 260 lb (117.9 kg)  Height: 5' 3"  (1.6 m)   BMI Assessment: Estimated body mass index is 46.06 kg/m as calculated from the following:   Height as of this encounter: 5' 3"  (1.6 m).   Weight as of this encounter: 260 lb (117.9 kg).  BMI interpretation table: BMI level Category Range  association with higher incidence of chronic pain  <18 kg/m2 Underweight   18.5-24.9 kg/m2 Ideal body weight   25-29.9 kg/m2 Overweight Increased incidence by 20%  30-34.9 kg/m2 Obese (Class I) Increased incidence by 68%  35-39.9 kg/m2 Severe obesity (Class II) Increased incidence by 136%  >40 kg/m2 Extreme obesity (Class III) Increased incidence by 254%  Patient's current BMI Ideal Body weight  Body mass index is 46.06 kg/m. Ideal body weight: 52.4 kg (115 lb 8.3 oz) Adjusted ideal body weight: 78.6 kg (173 lb 5 oz)   BMI Readings from Last 4 Encounters:  05/19/20 46.06 kg/m  04/12/18 40.74 kg/m  07/12/17 37.02 kg/m  06/21/17 37.02 kg/m   Wt Readings from Last 4 Encounters:  05/19/20 260 lb (117.9 kg)  04/12/18 230 lb (104.3 kg)  07/12/17 209 lb (94.8 kg)  06/21/17 209 lb (94.8 kg)    Psych/Mental status: Alert, oriented x 3 (person, place, & time)       Eyes: PERLA Respiratory: No evidence of acute respiratory distress  Cervical Spine Exam  Skin & Axial Inspection: No masses, redness, edema, swelling, or associated skin lesions Alignment: Symmetrical Functional ROM: Unrestricted ROM      Stability: No instability detected Muscle Tone/Strength: Functionally intact. No obvious neuro-muscular anomalies detected. Sensory (Neurological): Unimpaired Palpation: No palpable anomalies              Upper Extremity (UE) Exam    Side: Right upper extremity  Side: Left upper extremity  Skin & Extremity Inspection: Skin color, temperature, and hair growth are WNL. No peripheral edema or cyanosis. No masses, redness, swelling, asymmetry, or associated skin lesions. No contractures.  Skin & Extremity Inspection: Skin color, temperature, and hair growth are WNL. No peripheral edema or cyanosis. No masses, redness, swelling, asymmetry, or associated skin lesions. No contractures.  Functional ROM: Unrestricted ROM          Functional ROM: Unrestricted ROM          Muscle Tone/Strength:  Functionally intact. No obvious neuro-muscular anomalies detected.  Muscle Tone/Strength: Functionally intact. No obvious neuro-muscular anomalies detected.  Sensory (Neurological): Unimpaired          Sensory (Neurological): Unimpaired          Palpation: No palpable anomalies              Palpation: No palpable anomalies              Provocative Test(s):  Phalen's test: deferred Tinel's test: deferred Apley's scratch test (touch opposite shoulder):  Action 1 (Across chest): deferred Action 2 (Overhead): deferred Action 3 (LB reach): deferred   Provocative Test(s):  Phalen's test: deferred Tinel's test: deferred Apley's scratch test (touch opposite shoulder):  Action 1 (Across chest): deferred Action 2 (Overhead): deferred Action 3 (LB reach): deferred    Thoracic Spine Area Exam  Skin & Axial Inspection: No masses, redness, or swelling Alignment: Symmetrical Functional ROM: Unrestricted ROM Stability: No instability detected Muscle Tone/Strength: Functionally intact. No obvious neuro-muscular anomalies detected. Sensory (Neurological): Unimpaired Muscle strength & Tone: No palpable anomalies  Lumbar Exam  Skin & Axial Inspection: No masses, redness, or swelling Alignment: Symmetrical Functional ROM: Decreased ROM affecting both sides Stability: No instability detected Muscle Tone/Strength: Guarding observed Sensory (Neurological): Movement-associated pain Palpation: Complains of area being tender to palpation       Provocative Tests: Hyperextension/rotation test: (+) bilaterally for facet joint pain. Lumbar quadrant test (Kemp's test): (+) bilaterally for facet joint pain. Lateral bending test: (-)       Patrick's Maneuver: (+) for bilateral S-I arthralgia and for bilateral hip arthralgia FABER* test: (+) for bilateral S-I arthralgia and for bilateral hip arthralgia S-I anterior distraction/compression test: deferred today         S-I lateral compression test: deferred  today         S-I Thigh-thrust  test: deferred today         S-I Gaenslen's test: deferred today         *(Flexion, ABduction and External Rotation)  Gait & Posture Assessment  Ambulation: Patient came in today in a wheel chair Gait: Antalgic gait (limping) Posture: Difficulty standing up straight, due to pain   Lower Extremity Exam    Side: Right lower extremity  Side: Left lower extremity  Stability: No instability observed          Stability: No instability observed          Skin & Extremity Inspection: Skin color, temperature, and hair growth are WNL. No peripheral edema or cyanosis. No masses, redness, swelling, asymmetry, or associated skin lesions. No contractures.  Skin & Extremity Inspection: Skin color, temperature, and hair growth are WNL. No peripheral edema or cyanosis. No masses, redness, swelling, asymmetry, or associated skin lesions. No contractures.  Functional ROM: Decreased ROM for hip and knee joints Limited SLR (straight leg raise)  Functional ROM: Decreased ROM for hip and knee joints Limited SLR (straight leg raise)  Muscle Tone/Strength: Moderate-to-severe deconditioning  Muscle Tone/Strength: Moderate-to-severe deconditioning  Sensory (Neurological): Hyperalgesia (Increased sensitivity to pain)        Sensory (Neurological): Hyperalgesia (Increased sensitivity to pain)        DTR: Patellar: 0: absent Achilles: 2+: normal Plantar: deferred today  DTR: Patellar: 0: absent Achilles: 1+: trace Plantar: deferred today  Palpation: Complains of area being tender to palpation  Palpation: Complains of area being tender to palpation   Assessment  Primary Diagnosis & Pertinent Problem List: The primary encounter diagnosis was Chronic pain syndrome. Diagnoses of Osteoarthritis of knee (Bilateral), Lumbar Grade 1 Anterolisthesis of L4/L5, Lumbar facet arthropathy (Multilevel) (Bilateral), Lumbar foraminal stenosis (Multilevel) (Bilateral), Lumbar lateral recess stenosis  (L3-4) (Right), Lumbar central spinal stenosis w/o neurogenic claudication (L4-5), DDD (degenerative disc disease), lumbosacral, Lumbosacral IVDD (intervertebral disc displacement), Abnormal MRI, lumbar spine (02/04/2020), Pharmacologic therapy, Disorder of skeletal system, Problems influencing health status, HIV infection, unspecified symptom status ([place]), Drug addiction syndrome ([place]), History of cocaine use, Chronic bilateral low back pain with bilateral sciatica, Chronic knee pain (Bilateral), Chronic lower extremity pain (2ry area of Pain) (Bilateral) (R>L), Chronic knee pain after total knee replacement (Bilateral) (L>R), Chronic hip pain (3ry area of Pain) (Bilateral) (R>L), Chronic sacroiliac joint pain (Bilateral) (R>L), and Lumbar facet syndrome (Bilateral) (R>L) were also pertinent to this visit.  Visit Diagnosis (New problems to examiner): 1. Chronic pain syndrome   2. Osteoarthritis of knee (Bilateral)   3. Lumbar Grade 1 Anterolisthesis of L4/L5   4. Lumbar facet arthropathy (Multilevel) (Bilateral)   5. Lumbar foraminal stenosis (Multilevel) (Bilateral)   6. Lumbar lateral recess stenosis (L3-4) (Right)   7. Lumbar central spinal stenosis w/o neurogenic claudication (L4-5)   8. DDD (degenerative disc disease), lumbosacral   9. Lumbosacral IVDD (intervertebral disc displacement)   10. Abnormal MRI, lumbar spine (02/04/2020)   11. Pharmacologic therapy   12. Disorder of skeletal system   13. Problems influencing health status   14. HIV infection, unspecified symptom status ([place])   15. Drug addiction syndrome ([place])   16. History of cocaine use   17. Chronic bilateral low back pain with bilateral sciatica   18. Chronic knee pain (Bilateral)   19. Chronic lower extremity pain (2ry area of Pain) (Bilateral) (R>L)   20. Chronic knee pain after total knee replacement (Bilateral) (L>R)   21. Chronic hip pain (3ry area of Pain) (Bilateral) (  R>L)   22. Chronic sacroiliac joint pain  (Bilateral) (R>L)   23. Lumbar facet syndrome (Bilateral) (R>L)    Plan of Care (Initial workup plan)  Note: Ms. Pudlo was reminded that as per protocol, today's visit has been an evaluation only. We have not taken over the patient's controlled substance management.  Problem-specific plan: No problem-specific Assessment & Plan notes found for this encounter.   Lab Orders     Compliance Drug Analysis, Ur     Comp. Metabolic Panel (12)     Magnesium     Vitamin B12     Sedimentation rate     25-Hydroxy vitamin D Lcms D2+D3     C-reactive protein  Imaging Orders     DG HIP UNILAT W OR W/O PELVIS 2-3 VIEWS RIGHT     DG HIP UNILAT W OR W/O PELVIS 2-3 VIEWS LEFT     DG Knee 1-2 Views Right     DG Knee 1-2 Views Left     DG Lumbar Spine Complete W/Bend     DG Si Joints Referral Orders  No referral(s) requested today   Procedure Orders    No procedure(s) ordered today   Pharmacotherapy (current): Medications ordered:  Meds ordered this encounter  Medications  . ketorolac (TORADOL) injection 60 mg  . orphenadrine (NORFLEX) injection 60 mg   Medications administered during this visit: Jamice L. Grall had no medications administered during this visit.   Pharmacological management options:  Opioid Analgesics: The patient was informed that there is no guarantee that she would be a candidate for opioid analgesics. The decision will be made following CDC guidelines. This decision will be based on the results of diagnostic studies, as well as Ms. Xie's risk profile.   Membrane stabilizer: To be determined at a later time  Muscle relaxant: To be determined at a later time  NSAID: To be determined at a later time  Other analgesic(s): To be determined at a later time   Interventional management options: Ms. Chiao was informed that there is no guarantee that she would be a candidate for interventional therapies. The decision will be based on the results of diagnostic studies,  as well as Ms. Goeken's risk profile.  Procedure(s) under consideration:  Diagnostic right-sided L4-5 LESI  Diagnostic bilateral S1 transforaminal ESI    Provider-requested follow-up: Return for (F2F), (2V), (s/p Tests) to review labs and x-rays.  Future Appointments  Date Time Provider [place]  06/09/2020  9:00 AM Milinda Pointer, MD [facility] None    Note by: Gaspar Cola, MD Date: 05/19/2020; Time: 9:45 AM

## 2020-05-19 ENCOUNTER — Other Ambulatory Visit: Payer: Self-pay

## 2020-05-19 ENCOUNTER — Encounter: Payer: Self-pay | Admitting: Pain Medicine

## 2020-05-19 ENCOUNTER — Other Ambulatory Visit
Admission: RE | Admit: 2020-05-19 | Discharge: 2020-05-19 | Disposition: A | Discharge status: Home / Self Care | Payer: Medicare HMO | Source: Home / Self Care | Attending: Pain Medicine | Admitting: Pain Medicine

## 2020-05-19 ENCOUNTER — Ambulatory Visit
Admission: RE | Admit: 2020-05-19 | Discharge: 2020-05-19 | Disposition: A | Discharge status: Home / Self Care | Payer: Medicare HMO | Attending: Pain Medicine | Admitting: Pain Medicine

## 2020-05-19 ENCOUNTER — Ambulatory Visit (HOSPITAL_BASED_OUTPATIENT_CLINIC_OR_DEPARTMENT_OTHER): Payer: Medicare HMO | Admitting: Pain Medicine

## 2020-05-19 ENCOUNTER — Ambulatory Visit
Admission: RE | Admit: 2020-05-19 | Discharge: 2020-05-19 | Disposition: A | Discharge status: Home / Self Care | Payer: Medicare HMO | Source: Ambulatory Visit | Attending: Pain Medicine | Admitting: Pain Medicine

## 2020-05-19 VITALS — BP 119/75 | HR 101 | Temp 97.2°F | Resp 17 | Ht 63.0 in | Wt 260.0 lb

## 2020-05-19 DIAGNOSIS — M5441 Lumbago with sciatica, right side: Secondary | ICD-10-CM | POA: Insufficient documentation

## 2020-05-19 DIAGNOSIS — M170 Bilateral primary osteoarthritis of knee: Secondary | ICD-10-CM | POA: Insufficient documentation

## 2020-05-19 DIAGNOSIS — M533 Sacrococcygeal disorders, not elsewhere classified: Secondary | ICD-10-CM | POA: Insufficient documentation

## 2020-05-19 DIAGNOSIS — M25562 Pain in left knee: Secondary | ICD-10-CM

## 2020-05-19 DIAGNOSIS — Z79899 Other long term (current) drug therapy: Secondary | ICD-10-CM | POA: Insufficient documentation

## 2020-05-19 DIAGNOSIS — M25552 Pain in left hip: Secondary | ICD-10-CM

## 2020-05-19 DIAGNOSIS — M4726 Other spondylosis with radiculopathy, lumbar region: Secondary | ICD-10-CM | POA: Diagnosis not present

## 2020-05-19 DIAGNOSIS — M51379 Other intervertebral disc degeneration, lumbosacral region without mention of lumbar back pain or lower extremity pain: Secondary | ICD-10-CM

## 2020-05-19 DIAGNOSIS — M79605 Pain in left leg: Secondary | ICD-10-CM

## 2020-05-19 DIAGNOSIS — M899 Disorder of bone, unspecified: Secondary | ICD-10-CM | POA: Insufficient documentation

## 2020-05-19 DIAGNOSIS — M5127 Other intervertebral disc displacement, lumbosacral region: Secondary | ICD-10-CM | POA: Insufficient documentation

## 2020-05-19 DIAGNOSIS — M25561 Pain in right knee: Secondary | ICD-10-CM | POA: Insufficient documentation

## 2020-05-19 DIAGNOSIS — F1920 Other psychoactive substance dependence, uncomplicated: Secondary | ICD-10-CM | POA: Insufficient documentation

## 2020-05-19 DIAGNOSIS — M47816 Spondylosis without myelopathy or radiculopathy, lumbar region: Secondary | ICD-10-CM | POA: Insufficient documentation

## 2020-05-19 DIAGNOSIS — M25551 Pain in right hip: Secondary | ICD-10-CM

## 2020-05-19 DIAGNOSIS — R937 Abnormal findings on diagnostic imaging of other parts of musculoskeletal system: Secondary | ICD-10-CM

## 2020-05-19 DIAGNOSIS — M79604 Pain in right leg: Secondary | ICD-10-CM | POA: Insufficient documentation

## 2020-05-19 DIAGNOSIS — Z87898 Personal history of other specified conditions: Secondary | ICD-10-CM

## 2020-05-19 DIAGNOSIS — N289 Disorder of kidney and ureter, unspecified: Secondary | ICD-10-CM | POA: Insufficient documentation

## 2020-05-19 DIAGNOSIS — G894 Chronic pain syndrome: Secondary | ICD-10-CM | POA: Insufficient documentation

## 2020-05-19 DIAGNOSIS — M160 Bilateral primary osteoarthritis of hip: Secondary | ICD-10-CM | POA: Diagnosis not present

## 2020-05-19 DIAGNOSIS — M48061 Spinal stenosis, lumbar region without neurogenic claudication: Secondary | ICD-10-CM | POA: Insufficient documentation

## 2020-05-19 DIAGNOSIS — M5137 Other intervertebral disc degeneration, lumbosacral region: Secondary | ICD-10-CM | POA: Insufficient documentation

## 2020-05-19 DIAGNOSIS — M5442 Lumbago with sciatica, left side: Secondary | ICD-10-CM | POA: Insufficient documentation

## 2020-05-19 DIAGNOSIS — G8928 Other chronic postprocedural pain: Secondary | ICD-10-CM

## 2020-05-19 DIAGNOSIS — Z96653 Presence of artificial knee joint, bilateral: Secondary | ICD-10-CM

## 2020-05-19 DIAGNOSIS — G8929 Other chronic pain: Secondary | ICD-10-CM | POA: Insufficient documentation

## 2020-05-19 DIAGNOSIS — B20 Human immunodeficiency virus [HIV] disease: Secondary | ICD-10-CM

## 2020-05-19 DIAGNOSIS — M4310 Spondylolisthesis, site unspecified: Secondary | ICD-10-CM | POA: Insufficient documentation

## 2020-05-19 DIAGNOSIS — Z789 Other specified health status: Secondary | ICD-10-CM | POA: Insufficient documentation

## 2020-05-19 DIAGNOSIS — Z21 Asymptomatic human immunodeficiency virus [HIV] infection status: Secondary | ICD-10-CM

## 2020-05-19 DIAGNOSIS — F1491 Cocaine use, unspecified, in remission: Secondary | ICD-10-CM

## 2020-05-19 HISTORY — DX: Other chronic pain: G89.29

## 2020-05-19 HISTORY — DX: Lumbago with sciatica, left side: M54.42

## 2020-05-19 LAB — MAGNESIUM: Magnesium: 2.1 mg/dL (ref 1.7–2.4)

## 2020-05-19 LAB — COMPREHENSIVE METABOLIC PANEL
ALT: 19 U/L (ref 0–44)
AST: 23 U/L (ref 15–41)
Albumin: 4.1 g/dL (ref 3.5–5.0)
Alkaline Phosphatase: 120 U/L (ref 38–126)
Anion gap: 12 (ref 5–15)
BUN: 19 mg/dL (ref 8–23)
CO2: 25 mmol/L (ref 22–32)
Calcium: 9.2 mg/dL (ref 8.9–10.3)
Chloride: 100 mmol/L (ref 98–111)
Creatinine, Ser: 1.04 mg/dL — ABNORMAL HIGH (ref 0.44–1.00)
GFR calc Af Amer: >60 mL/min (ref >60)
GFR calc non Af Amer: 56 mL/min — ABNORMAL LOW (ref >60)
Glucose, Bld: 100 mg/dL — ABNORMAL HIGH (ref 70–99)
Potassium: 3.6 mmol/L (ref 3.5–5.1)
Sodium: 137 mmol/L (ref 135–145)
Total Bilirubin: 0.4 mg/dL (ref 0.3–1.2)
Total Protein: 7.8 g/dL (ref 6.5–8.1)

## 2020-05-19 LAB — C-REACTIVE PROTEIN: CRP: 1.4 mg/dL — ABNORMAL HIGH (ref <1.0)

## 2020-05-19 LAB — VITAMIN D 25 HYDROXY (VIT D DEFICIENCY, FRACTURES): Vit D, 25-Hydroxy: 8.66 ng/mL — ABNORMAL LOW (ref 30–100)

## 2020-05-19 LAB — VITAMIN B12: Vitamin B-12: 358 pg/mL (ref 180–914)

## 2020-05-19 LAB — SEDIMENTATION RATE: Sed Rate: 59 mm/hr — ABNORMAL HIGH (ref 0–30)

## 2020-05-19 MED ORDER — ORPHENADRINE CITRATE 30 MG/ML IJ SOLN
60.0000 mg | Freq: Once | INTRAMUSCULAR | Status: AC
  Administered 2020-05-19: 60 mg via INTRAMUSCULAR
  Filled 2020-05-19: qty 2

## 2020-05-19 MED ORDER — KETOROLAC TROMETHAMINE 60 MG/2ML IM SOLN
60.0000 mg | Freq: Once | INTRAMUSCULAR | Status: AC
  Administered 2020-05-19: 60 mg via INTRAMUSCULAR
  Filled 2020-05-19: qty 2

## 2020-05-19 NOTE — Patient Instructions (Signed)
____________________________________________________________________________________________  General Risks and Possible Complications  Patient Responsibilities: It is important that you read this as it is part of your informed consent. It is our duty to inform you of the risks and possible complications associated with treatments offered to you. It is your responsibility as a patient to read this and to ask questions about anything that is not clear or that you believe was not covered in this document.  Patient's Rights: You have the right to refuse treatment. You also have the right to change your mind, even after initially having agreed to have the treatment done. However, under this last option, if you wait until the last second to change your mind, you may be charged for the materials used up to that point.  Introduction: Medicine is not an exact science. Everything in Medicine, including the lack of treatment(s), carries the potential for danger, harm, or loss (which is by definition: Risk). In Medicine, a complication is a secondary problem, condition, or disease that can aggravate an already existing one. All treatments carry the risk of possible complications. The fact that a side effects or complications occurs, does not imply that the treatment was conducted incorrectly. It must be clearly understood that these can happen even when everything is done following the highest safety standards.  No treatment: You can choose not to proceed with the proposed treatment alternative. The "PRO(s)" would include: avoiding the risk of complications associated with the therapy. The "CON(s)" would include: not getting any of the treatment benefits. These benefits fall under one of three categories: diagnostic; therapeutic; and/or palliative. Diagnostic benefits include: getting information which can ultimately lead to improvement of the disease or symptom(s). Therapeutic benefits are those associated with the  successful treatment of the disease. Finally, palliative benefits are those related to the decrease of the primary symptoms, without necessarily curing the condition (example: decreasing the pain from a flare-up of a chronic condition, such as incurable terminal cancer).  General Risks and Complications: These are associated to most interventional treatments. They can occur alone, or in combination. They fall under one of the following six (6) categories: no benefit or worsening of symptoms; bleeding; infection; nerve damage; allergic reactions; and/or death. 1. No benefits or worsening of symptoms: In Medicine there are no guarantees, only probabilities. No healthcare provider can ever guarantee that a medical treatment will work, they can only state the probability that it may. Furthermore, there is always the possibility that the condition may worsen, either directly, or indirectly, as a consequence of the treatment. 2. Bleeding: This is more common if the patient is taking a blood thinner, either prescription or over the counter (example: Goody Powders, Fish oil, Aspirin, Garlic, etc.), or if suffering a condition associated with impaired coagulation (example: Hemophilia, cirrhosis of the liver, low platelet counts, etc.). However, even if you do not have one on these, it can still happen. If you have any of these conditions, or take one of these drugs, make sure to notify your treating physician. 3. Infection: This is more common in patients with a compromised immune system, either due to disease (example: diabetes, cancer, human immunodeficiency virus [HIV], etc.), or due to medications or treatments (example: therapies used to treat cancer and rheumatological diseases). However, even if you do not have one on these, it can still happen. If you have any of these conditions, or take one of these drugs, make sure to notify your treating physician. 4. Nerve Damage: This is more common when the   treatment is  an invasive one, but it can also happen with the use of medications, such as those used in the treatment of cancer. The damage can occur to small secondary nerves, or to large primary ones, such as those in the spinal cord and brain. This damage may be temporary or permanent and it may lead to impairments that can range from temporary numbness to permanent paralysis and/or brain death. 5. Allergic Reactions: Any time a substance or material comes in contact with our body, there is the possibility of an allergic reaction. These can range from a mild skin rash (contact dermatitis) to a severe systemic reaction (anaphylactic reaction), which can result in death. 6. Death: In general, any medical intervention can result in death, most of the time due to an unforeseen complication. ____________________________________________________________________________________________   

## 2020-05-19 NOTE — Progress Notes (Signed)
Safety precautions to be maintained throughout the outpatient stay will include: orient to surroundings, keep bed in low position, maintain call bell within reach at all times, provide assistance with transfer out of bed and ambulation.  

## 2020-05-23 LAB — COMPLIANCE DRUG ANALYSIS, UR

## 2020-06-08 DIAGNOSIS — R700 Elevated erythrocyte sedimentation rate: Secondary | ICD-10-CM | POA: Insufficient documentation

## 2020-06-08 DIAGNOSIS — Z6841 Body Mass Index (BMI) 40.0 and over, adult: Secondary | ICD-10-CM | POA: Insufficient documentation

## 2020-06-08 DIAGNOSIS — R7982 Elevated C-reactive protein (CRP): Secondary | ICD-10-CM | POA: Insufficient documentation

## 2020-06-08 NOTE — Progress Notes (Deleted)
Patient is no show

## 2020-06-09 ENCOUNTER — Ambulatory Visit: Payer: Medicare HMO | Admitting: Pain Medicine

## 2020-06-17 DIAGNOSIS — F331 Major depressive disorder, recurrent, moderate: Secondary | ICD-10-CM | POA: Diagnosis not present

## 2020-06-17 DIAGNOSIS — F332 Major depressive disorder, recurrent severe without psychotic features: Secondary | ICD-10-CM | POA: Diagnosis not present

## 2020-06-17 DIAGNOSIS — E669 Obesity, unspecified: Secondary | ICD-10-CM | POA: Diagnosis not present

## 2020-06-17 DIAGNOSIS — I10 Essential (primary) hypertension: Secondary | ICD-10-CM | POA: Diagnosis not present

## 2020-06-17 DIAGNOSIS — B20 Human immunodeficiency virus [HIV] disease: Secondary | ICD-10-CM | POA: Diagnosis not present

## 2020-06-27 ENCOUNTER — Other Ambulatory Visit: Payer: Self-pay

## 2020-07-01 DIAGNOSIS — E669 Obesity, unspecified: Secondary | ICD-10-CM | POA: Diagnosis not present

## 2020-07-01 DIAGNOSIS — I10 Essential (primary) hypertension: Secondary | ICD-10-CM | POA: Diagnosis not present

## 2020-07-01 DIAGNOSIS — F332 Major depressive disorder, recurrent severe without psychotic features: Secondary | ICD-10-CM | POA: Diagnosis not present

## 2020-07-03 DIAGNOSIS — Z23 Encounter for immunization: Secondary | ICD-10-CM | POA: Diagnosis not present

## 2020-07-03 DIAGNOSIS — B20 Human immunodeficiency virus [HIV] disease: Secondary | ICD-10-CM | POA: Diagnosis not present

## 2020-07-03 DIAGNOSIS — F1410 Cocaine abuse, uncomplicated: Secondary | ICD-10-CM | POA: Diagnosis not present

## 2020-07-03 DIAGNOSIS — A6000 Herpesviral infection of urogenital system, unspecified: Secondary | ICD-10-CM | POA: Diagnosis not present

## 2020-07-03 DIAGNOSIS — E669 Obesity, unspecified: Secondary | ICD-10-CM | POA: Diagnosis not present

## 2020-07-03 DIAGNOSIS — R7309 Other abnormal glucose: Secondary | ICD-10-CM | POA: Diagnosis not present

## 2020-07-03 DIAGNOSIS — I10 Essential (primary) hypertension: Secondary | ICD-10-CM | POA: Diagnosis not present

## 2020-07-03 DIAGNOSIS — Z8619 Personal history of other infectious and parasitic diseases: Secondary | ICD-10-CM | POA: Diagnosis not present

## 2020-07-03 DIAGNOSIS — Z1389 Encounter for screening for other disorder: Secondary | ICD-10-CM | POA: Diagnosis not present

## 2020-07-03 DIAGNOSIS — F332 Major depressive disorder, recurrent severe without psychotic features: Secondary | ICD-10-CM | POA: Diagnosis not present

## 2020-07-03 DIAGNOSIS — K7460 Unspecified cirrhosis of liver: Secondary | ICD-10-CM | POA: Diagnosis not present

## 2020-07-03 DIAGNOSIS — F17200 Nicotine dependence, unspecified, uncomplicated: Secondary | ICD-10-CM | POA: Diagnosis not present

## 2020-07-11 ENCOUNTER — Other Ambulatory Visit: Payer: Self-pay | Admitting: Registered Nurse

## 2020-07-11 DIAGNOSIS — Z13820 Encounter for screening for osteoporosis: Secondary | ICD-10-CM

## 2020-07-16 ENCOUNTER — Other Ambulatory Visit: Payer: Self-pay | Admitting: Registered Nurse

## 2020-07-16 DIAGNOSIS — M79671 Pain in right foot: Secondary | ICD-10-CM | POA: Diagnosis not present

## 2020-07-16 DIAGNOSIS — F332 Major depressive disorder, recurrent severe without psychotic features: Secondary | ICD-10-CM | POA: Diagnosis not present

## 2020-07-16 DIAGNOSIS — K7460 Unspecified cirrhosis of liver: Secondary | ICD-10-CM

## 2020-07-22 ENCOUNTER — Ambulatory Visit: Admission: RE | Admit: 2020-07-22 | Payer: Medicare HMO | Source: Ambulatory Visit

## 2020-07-22 DIAGNOSIS — E669 Obesity, unspecified: Secondary | ICD-10-CM | POA: Diagnosis not present

## 2020-07-22 DIAGNOSIS — R7309 Other abnormal glucose: Secondary | ICD-10-CM | POA: Diagnosis not present

## 2020-07-29 DIAGNOSIS — F332 Major depressive disorder, recurrent severe without psychotic features: Secondary | ICD-10-CM | POA: Diagnosis not present

## 2020-07-29 DIAGNOSIS — R7309 Other abnormal glucose: Secondary | ICD-10-CM | POA: Diagnosis not present

## 2020-07-30 DIAGNOSIS — E669 Obesity, unspecified: Secondary | ICD-10-CM | POA: Diagnosis not present

## 2020-07-30 DIAGNOSIS — R7309 Other abnormal glucose: Secondary | ICD-10-CM | POA: Diagnosis not present

## 2020-07-30 DIAGNOSIS — F332 Major depressive disorder, recurrent severe without psychotic features: Secondary | ICD-10-CM | POA: Diagnosis not present

## 2020-08-13 DIAGNOSIS — M7090 Unspecified soft tissue disorder related to use, overuse and pressure of unspecified site: Secondary | ICD-10-CM | POA: Diagnosis not present

## 2020-08-13 DIAGNOSIS — G43909 Migraine, unspecified, not intractable, without status migrainosus: Secondary | ICD-10-CM | POA: Diagnosis not present

## 2020-08-13 DIAGNOSIS — M5417 Radiculopathy, lumbosacral region: Secondary | ICD-10-CM | POA: Diagnosis not present

## 2020-08-13 DIAGNOSIS — E669 Obesity, unspecified: Secondary | ICD-10-CM | POA: Diagnosis not present

## 2020-08-22 DIAGNOSIS — F332 Major depressive disorder, recurrent severe without psychotic features: Secondary | ICD-10-CM | POA: Diagnosis not present

## 2020-08-22 DIAGNOSIS — F1410 Cocaine abuse, uncomplicated: Secondary | ICD-10-CM | POA: Diagnosis not present

## 2020-08-22 DIAGNOSIS — I10 Essential (primary) hypertension: Secondary | ICD-10-CM | POA: Diagnosis not present

## 2020-08-22 DIAGNOSIS — Z23 Encounter for immunization: Secondary | ICD-10-CM | POA: Diagnosis not present

## 2020-08-22 DIAGNOSIS — K7460 Unspecified cirrhosis of liver: Secondary | ICD-10-CM | POA: Diagnosis not present

## 2020-08-22 DIAGNOSIS — F17200 Nicotine dependence, unspecified, uncomplicated: Secondary | ICD-10-CM | POA: Diagnosis not present

## 2020-08-22 DIAGNOSIS — N3000 Acute cystitis without hematuria: Secondary | ICD-10-CM | POA: Diagnosis not present

## 2020-08-22 DIAGNOSIS — Z1389 Encounter for screening for other disorder: Secondary | ICD-10-CM | POA: Diagnosis not present

## 2020-08-25 DIAGNOSIS — E669 Obesity, unspecified: Secondary | ICD-10-CM | POA: Diagnosis not present

## 2020-08-25 DIAGNOSIS — R7309 Other abnormal glucose: Secondary | ICD-10-CM | POA: Diagnosis not present

## 2020-09-18 DIAGNOSIS — R7309 Other abnormal glucose: Secondary | ICD-10-CM | POA: Diagnosis not present

## 2020-09-18 DIAGNOSIS — E669 Obesity, unspecified: Secondary | ICD-10-CM | POA: Diagnosis not present

## 2020-09-18 DIAGNOSIS — F332 Major depressive disorder, recurrent severe without psychotic features: Secondary | ICD-10-CM | POA: Diagnosis not present

## 2020-10-06 DIAGNOSIS — K219 Gastro-esophageal reflux disease without esophagitis: Secondary | ICD-10-CM | POA: Diagnosis not present

## 2020-10-06 DIAGNOSIS — I10 Essential (primary) hypertension: Secondary | ICD-10-CM | POA: Diagnosis not present

## 2020-10-27 ENCOUNTER — Ambulatory Visit
Admission: RE | Admit: 2020-10-27 | Discharge: 2020-10-27 | Disposition: A | Discharge status: Home / Self Care | Payer: Medicare HMO | Source: Ambulatory Visit | Attending: Family Medicine | Admitting: Family Medicine

## 2020-10-27 ENCOUNTER — Other Ambulatory Visit: Payer: Self-pay

## 2020-10-27 ENCOUNTER — Ambulatory Visit (INDEPENDENT_AMBULATORY_CARE_PROVIDER_SITE_OTHER): Payer: Medicare HMO | Admitting: Gastroenterology

## 2020-10-27 ENCOUNTER — Other Ambulatory Visit: Payer: Self-pay | Admitting: Family Medicine

## 2020-10-27 VITALS — BP 156/90 | HR 109 | Temp 98.2°F | Ht 63.0 in | Wt 231.0 lb

## 2020-10-27 DIAGNOSIS — S99921A Unspecified injury of right foot, initial encounter: Secondary | ICD-10-CM | POA: Diagnosis not present

## 2020-10-27 DIAGNOSIS — M7731 Calcaneal spur, right foot: Secondary | ICD-10-CM | POA: Diagnosis not present

## 2020-10-27 DIAGNOSIS — M79671 Pain in right foot: Secondary | ICD-10-CM

## 2020-10-27 DIAGNOSIS — K625 Hemorrhage of anus and rectum: Secondary | ICD-10-CM

## 2020-10-27 DIAGNOSIS — M25571 Pain in right ankle and joints of right foot: Secondary | ICD-10-CM | POA: Diagnosis not present

## 2020-10-27 DIAGNOSIS — R159 Full incontinence of feces: Secondary | ICD-10-CM | POA: Diagnosis not present

## 2020-10-27 DIAGNOSIS — R32 Unspecified urinary incontinence: Secondary | ICD-10-CM

## 2020-10-27 DIAGNOSIS — K5900 Constipation, unspecified: Secondary | ICD-10-CM | POA: Diagnosis not present

## 2020-10-27 DIAGNOSIS — G629 Polyneuropathy, unspecified: Secondary | ICD-10-CM | POA: Diagnosis not present

## 2020-10-27 DIAGNOSIS — M19071 Primary osteoarthritis, right ankle and foot: Secondary | ICD-10-CM | POA: Diagnosis not present

## 2020-10-27 MED ORDER — NA SULFATE-K SULFATE-MG SULF 17.5-3.13-1.6 GM/177ML PO SOLN: 1.0000 | Freq: Once | ORAL | 0 refills | Status: AC

## 2020-10-27 NOTE — Progress Notes (Signed)
Lisa Bellows MD, MRCP(U.K) Lisa  Hawkins, [place] [address]  Main: [phone]  Fax: [phone]   Gastroenterology Consultation  Referring Provider:     Donnie Coffin, MD Primary Care Physician:  Donnie Coffin, MD Primary Gastroenterologist:  Dr. Jonathon Hawkins  Reason for Consultation: Fecal incontinence        HPI:   Lisa Hawkins is a 69 y.o. y/o female has been referred for fecal incontinence.  Last colonoscopy in June 2018 performed at [facility] performed for colon cancer screening preparation was fair but appearance of the colon was normal.  Suggested repeat in 5 years time.  02/04/2020 MRI of the lumbar spine without contrast shows disc bulge with superimposed left central disc extrusion at the L5-S1 level abutting the transversing left S1 nerve root.  Moderate bilateral neural foraminal narrowing at L4-L5 and L5-S1.  05/19/2020: B12 normal at 358 but truly it is indeterminant CRP 1.4 CMP normal except a creatinine of 1.04  She states that she has had issues with bowel incontinence since the age of 27 but is gradually got worse.  She also has urinary incontinence.  She can make it to the restroom on time in terms of passing her urine but in terms of her bowel incontinence does not have any recollection of when she would do so.  She has a bowel movement once every 5 days which is very hard in nature occasionally mixed with blood.  Takes 1 tablespoon of cooking oil daily for constipation.  Not tried any other medications.  She has a history of HIV.  She also complains of decreased sensation in both her feet.  Past Medical History:  Diagnosis Date  . Anxiety   . Arthritis   . Bronchitis   . Chronic bilateral low back pain with bilateral sciatica 05/19/2020  . Complication of anesthesia    vomitting  . Dysrhythmia    hx of flutter, no longer present  . Elevated lipids   . GERD (gastroesophageal reflux disease)    does not take meds but has gerd pretty bad   . Hepatitis    Hep C - treated with Harvoni  . HIV (human immunodeficiency virus infection) ([place])   . HOH (hard of hearing)   . Hypertension   . Myocardial infarction ([place]) 1993  . Neuropathy   . Seizures ([place])    not considered seizures but does "black out" and unsure of what happened. happens when she overheats. last episode 4 days ago.  . Stroke ([place])    no defecits    Past Surgical History:  Procedure Laterality Date  . ABDOMINAL HYSTERECTOMY  1970  . BACK SURGERY     no metal  . CATARACT EXTRACTION W/PHACO Right 06/21/2017   Procedure: CATARACT EXTRACTION PHACO AND INTRAOCULAR LENS PLACEMENT (IOC);  Surgeon: Birder Robson, MD;  Location: [facility] ORS;  Service: Ophthalmology;  Laterality: Right;  [place] 00:30.9 AP% 12.7 CDE 3.94 Fluid Pack Lot # [id number]  . CATARACT EXTRACTION W/PHACO Left 07/12/2017   Procedure: CATARACT EXTRACTION PHACO AND INTRAOCULAR LENS PLACEMENT ([place]);  Surgeon: Birder Robson, MD;  Location: [facility] ORS;  Service: Ophthalmology;  Laterality: Left;  [place]  00:42 AP% 15.2 CDE 6.38 Fluid pack lot # [id number]  . DIAGNOSTIC LAPAROSCOPY    . FRACTURE SURGERY Left    wrist  . HERNIA REPAIR  1655   umbilical  . JOINT REPLACEMENT Bilateral   . KNEE CLOSED REDUCTION Left 12/30/2016   Procedure: CLOSED MANIPULATION  KNEE;  Surgeon: Hessie Knows, MD;  Location: [facility] ORS;  Service: Orthopedics;  Laterality: Left;  . LAPAROTOMY    . TONSILLECTOMY    . TOTAL KNEE ARTHROPLASTY Right 07/15/2016   Procedure: TOTAL KNEE ARTHROPLASTY;  Surgeon: Hessie Knows, MD;  Location: [facility] ORS;  Service: Orthopedics;  Laterality: Right;  . TOTAL KNEE ARTHROPLASTY Left 10/26/2016   Procedure: TOTAL KNEE ARTHROPLASTY;  Surgeon: Hessie Knows, MD;  Location: [facility] ORS;  Service: Orthopedics;  Laterality: Left;    Prior to Admission medications   Medication Sig Start Date End Date Taking? Authorizing Provider  amitriptyline (ELAVIL) 25 MG tablet Take 75 mg by mouth at bedtime.  05/25/16    [provider]  amLODipine (NORVASC) 10 MG tablet Take 10 mg by mouth at bedtime. 05/25/16   [provider]  aspirin EC 81 MG tablet Take 81 mg by mouth at bedtime.    [provider]  citalopram (CELEXA) 10 MG tablet Take 10 mg by mouth at bedtime. 04/02/16   [provider]  gabapentin (NEURONTIN) 300 MG capsule Take 900 mg by mouth 2 (two) times daily. 03/29/16   [provider]  hydrochlorothiazide (HYDRODIURIL) 25 MG tablet Take 25 mg by mouth at bedtime. 05/25/16   [provider]  mirtazapine (REMERON) 30 MG tablet Take 30 mg by mouth at bedtime. 03/30/16   [provider]  oxyCODONE-acetaminophen (PERCOCET/ROXICET) 5-325 MG tablet Take 1 tablet by mouth every 4 (four) hours as needed for severe pain. Patient not taking: Reported on 05/19/2020 04/12/18   Versie Starks, PA-C  paroxetine mesylate (PEXEVA) 40 MG tablet Take 40 mg by mouth at bedtime.    [provider]  potassium chloride SA (K-DUR,KLOR-CON) 20 MEQ tablet Take 1 tablet (20 mEq total) by mouth once for 1 dose. 04/12/18 04/12/18  Caryn Section, Linden Dolin, PA-C  TRIUMEQ 600-50-300 MG tablet Take 1 tablet by mouth at bedtime. 04/28/16   [provider]    Family History  Problem Relation Age of Onset  . Hypertension Father      Social History   Tobacco Use  . Smoking status: Current Every Day Smoker    Packs/day: 0.50    Years: 45.00    Pack years: 22.50    Types: Cigarettes  . Smokeless tobacco: Never Used  Vaping Use  . Vaping Use: Never used  Substance Use Topics  . Alcohol use: No  . Drug use: No    Allergies as of 10/27/2020 - Review Complete 05/19/2020  Allergen Reaction Noted  . Penicillins Swelling and Rash 06/15/2016    Review of Systems:    All systems reviewed and negative except where noted in HPI.   Physical Exam:  There were no vitals taken for this visit. No LMP recorded. Patient has had a hysterectomy. Psych:  Alert and  cooperative. Normal mood and affect. General:   Alert,  Well-developed, well-nourished, pleasant and cooperative in NAD Head:  Normocephalic and atraumatic. Eyes:  Sclera clear, no icterus.   Conjunctiva pink. Ears:  Normal auditory acuity. Lungs:  Respirations even and unlabored.  Clear throughout to auscultation.   No wheezes, crackles, or rhonchi. No acute distress. Heart:  Regular rate and rhythm; no murmurs, clicks, rubs, or gallops. Abdomen:  Normal bowel sounds.  No bruits.  Soft, non-tender and non-distended without masses, hepatosplenomegaly or hernias noted.  No guarding or rebound tenderness.    Extremities:  No clubbing or edema.  No cyanosis. Neurologic:  Alert and oriented x3;  grossly normal neurologically except for decreased sensation in the right lower extremity. Psych:  Alert and cooperative. Normal mood and affect.  Imaging Studies: No results found.  Assessment and Plan:   MELLANIE BEJARANO is a 69 y.o. y/o female has been referred for fecal incontinence.  Foraminal impingement at L4-L5, L5-S1 seen on MRI back in March 2021.  She has a history of bowel and bladder incontinence ongoing for many years but gradually worsening.  She also has constipation.  Differentials include neurogenic incontinence secondary to nerve entrapment at L4-L5/L5-S1 level.  In addition constipation may be playing a role with overflow diarrhea.  This also could be due to nerve entrapment.  Due to the history of rectal bleeding I would suggest we proceed with an endoscopy and commence her on Linzess to treat any underlying constipation.  I will perform a detailed rectal exam on the day of her procedure.  She would probably benefit from seeing a Armed forces technical officer.  She would also probably benefit from evaluation of other forms of neuropathy with Aycock, Ngwe A, MD .  Her B12 levels is borderline as it is less than 400  and she probably would benefit from having her methylmalonic acid and  homocysteine levels checked by Aycock, Ngwe A, MD  and if her B12 levels are proven to be low could have it replaced.   I have discussed alternative options, risks & benefits,  which include, but are not limited to, bleeding, infection, perforation,respiratory complication & drug reaction.  The patient agrees with this plan & written consent will be obtained.     Follow up in 3 months   Dr Lisa Bellows MD,MRCP(U.K)

## 2020-10-29 ENCOUNTER — Other Ambulatory Visit: Payer: Self-pay

## 2020-10-29 NOTE — Progress Notes (Signed)
Procedure rescheduled and updated instructions mailed out.

## 2020-11-18 ENCOUNTER — Telehealth: Payer: Self-pay

## 2020-11-18 NOTE — Telephone Encounter (Signed)
Returned patients call. Pt had questions regarding prep and covid test date. Answered her questions and pt verbalized understanding.

## 2020-12-05 ENCOUNTER — Other Ambulatory Visit: Payer: Self-pay

## 2020-12-05 ENCOUNTER — Other Ambulatory Visit
Admission: RE | Admit: 2020-12-05 | Discharge: 2020-12-05 | Disposition: A | Discharge status: Home / Self Care | Payer: Medicare HMO | Source: Ambulatory Visit | Attending: Gastroenterology | Admitting: Gastroenterology

## 2020-12-05 DIAGNOSIS — Z20822 Contact with and (suspected) exposure to covid-19: Secondary | ICD-10-CM | POA: Insufficient documentation

## 2020-12-05 DIAGNOSIS — Z01812 Encounter for preprocedural laboratory examination: Secondary | ICD-10-CM | POA: Diagnosis present

## 2020-12-05 LAB — SARS CORONAVIRUS 2 (TAT 6-24 HRS): SARS Coronavirus 2: NEGATIVE

## 2020-12-09 ENCOUNTER — Ambulatory Visit: Payer: Medicare HMO | Admitting: Registered Nurse

## 2020-12-09 ENCOUNTER — Encounter: Payer: Self-pay | Admitting: Gastroenterology

## 2020-12-09 ENCOUNTER — Encounter
Admission: RE | Disposition: A | Discharge status: Home / Self Care | Payer: Self-pay | Source: Home / Self Care | Attending: Gastroenterology

## 2020-12-09 ENCOUNTER — Other Ambulatory Visit: Payer: Self-pay

## 2020-12-09 ENCOUNTER — Ambulatory Visit
Admission: RE | Admit: 2020-12-09 | Discharge: 2020-12-09 | Disposition: A | Discharge status: Home / Self Care | Payer: Medicare HMO | Attending: Gastroenterology | Admitting: Gastroenterology

## 2020-12-09 DIAGNOSIS — I252 Old myocardial infarction: Secondary | ICD-10-CM | POA: Diagnosis not present

## 2020-12-09 DIAGNOSIS — R194 Change in bowel habit: Secondary | ICD-10-CM

## 2020-12-09 DIAGNOSIS — Z8249 Family history of ischemic heart disease and other diseases of the circulatory system: Secondary | ICD-10-CM | POA: Diagnosis not present

## 2020-12-09 DIAGNOSIS — Z96653 Presence of artificial knee joint, bilateral: Secondary | ICD-10-CM | POA: Insufficient documentation

## 2020-12-09 DIAGNOSIS — Z9841 Cataract extraction status, right eye: Secondary | ICD-10-CM | POA: Diagnosis not present

## 2020-12-09 DIAGNOSIS — F17210 Nicotine dependence, cigarettes, uncomplicated: Secondary | ICD-10-CM | POA: Diagnosis not present

## 2020-12-09 DIAGNOSIS — Z8673 Personal history of transient ischemic attack (TIA), and cerebral infarction without residual deficits: Secondary | ICD-10-CM | POA: Diagnosis not present

## 2020-12-09 DIAGNOSIS — R159 Full incontinence of feces: Secondary | ICD-10-CM

## 2020-12-09 DIAGNOSIS — Z7982 Long term (current) use of aspirin: Secondary | ICD-10-CM | POA: Diagnosis not present

## 2020-12-09 DIAGNOSIS — Z961 Presence of intraocular lens: Secondary | ICD-10-CM | POA: Diagnosis not present

## 2020-12-09 DIAGNOSIS — Z9842 Cataract extraction status, left eye: Secondary | ICD-10-CM | POA: Insufficient documentation

## 2020-12-09 DIAGNOSIS — Z880 Allergy status to penicillin: Secondary | ICD-10-CM | POA: Diagnosis not present

## 2020-12-09 DIAGNOSIS — K625 Hemorrhage of anus and rectum: Secondary | ICD-10-CM

## 2020-12-09 DIAGNOSIS — Z79899 Other long term (current) drug therapy: Secondary | ICD-10-CM | POA: Insufficient documentation

## 2020-12-09 DIAGNOSIS — R32 Unspecified urinary incontinence: Secondary | ICD-10-CM

## 2020-12-09 HISTORY — PX: COLONOSCOPY WITH PROPOFOL: SHX5780

## 2020-12-09 SURGERY — COLONOSCOPY WITH PROPOFOL
Anesthesia: General

## 2020-12-09 MED ORDER — PROPOFOL 10 MG/ML IV BOLUS
INTRAVENOUS | Status: DC | PRN
  Administered 2020-12-09: 80 mg via INTRAVENOUS
  Administered 2020-12-09: 20 mg via INTRAVENOUS

## 2020-12-09 MED ORDER — PROPOFOL 500 MG/50ML IV EMUL
INTRAVENOUS | Status: DC | PRN
  Administered 2020-12-09: 140 ug/kg/min via INTRAVENOUS

## 2020-12-09 MED ORDER — SODIUM CHLORIDE 0.9 % IV SOLN: INTRAVENOUS | Status: DC

## 2020-12-09 NOTE — Transfer of Care (Signed)
Immediate Anesthesia Transfer of Care Note  Patient: Lisa Hawkins  Procedure(s) Performed: COLONOSCOPY WITH PROPOFOL (N/A )  Patient Location: PACU  Anesthesia Type:General  Level of Consciousness: sedated  Airway & Oxygen Therapy: Patient Spontanous Breathing  Post-op Assessment: Report given to RN and Post -op Vital signs reviewed and stable  Post vital signs: Reviewed and stable  Last Vitals:  Vitals Value Taken Time  BP 104/55 12/09/20 1056  Temp    Pulse 102 12/09/20 1056  Resp 15 12/09/20 1056  SpO2 98 % 12/09/20 1056  Vitals shown include unvalidated device data.  Last Pain:  Vitals:   12/09/20 0932  TempSrc: Temporal  PainSc: 0-No pain         Complications: No complications documented.

## 2020-12-09 NOTE — Anesthesia Postprocedure Evaluation (Signed)
Anesthesia Post Note  Patient: Lisa Hawkins  Procedure(s) Performed: COLONOSCOPY WITH PROPOFOL (N/A )  Patient location during evaluation: Endoscopy Anesthesia Type: General Level of consciousness: awake and alert Pain management: pain level controlled Vital Signs Assessment: post-procedure vital signs reviewed and stable Respiratory status: spontaneous breathing and respiratory function stable Cardiovascular status: stable Anesthetic complications: no   No complications documented.   Last Vitals:  Vitals:   12/09/20 1116 12/09/20 1126  BP: 105/87 (!) 138/59  Pulse: 90 88  Resp: (!) 21 (!) 26  Temp:    SpO2: 99% 99%    Last Pain:  Vitals:   12/09/20 1126  TempSrc:   PainSc: 0-No pain                 Veasna Santibanez K

## 2020-12-09 NOTE — H&P (Signed)
Jonathon Bellows, MD [address], [place], [place], [place], [address] [address], [place], [place], [place], [address] Phone: [phone]  Fax: [phone]  Primary Care Physician:  Donnie Coffin, MD   Pre-Procedure History & Physical: HPI:  Lisa Hawkins is a 70 y.o. female is here for an colonoscopy.   Past Medical History:  Diagnosis Date  . Anxiety   . Arthritis   . Bronchitis   . Chronic bilateral low back pain with bilateral sciatica 05/19/2020  . Complication of anesthesia    vomitting  . Dysrhythmia    hx of flutter, no longer present  . Elevated lipids   . GERD (gastroesophageal reflux disease)    does not take meds but has gerd pretty bad  . Hepatitis    Hep C - treated with Harvoni  . HIV (human immunodeficiency virus infection) ([place])   . HOH (hard of hearing)   . Hypertension   . Myocardial infarction ([place]) 1993  . Neuropathy   . Seizures ([place])    not considered seizures but does "black out" and unsure of what happened. happens when she overheats. last episode 4 days ago.  . Stroke ([place])    no defecits    Past Surgical History:  Procedure Laterality Date  . ABDOMINAL HYSTERECTOMY  1970  . BACK SURGERY     no metal  . CATARACT EXTRACTION W/PHACO Right 06/21/2017   Procedure: CATARACT EXTRACTION PHACO AND INTRAOCULAR LENS PLACEMENT (IOC);  Surgeon: Birder Robson, MD;  Location: [facility] ORS;  Service: Ophthalmology;  Laterality: Right;  [place] 00:30.9 AP% 12.7 CDE 3.94 Fluid Pack Lot # [id number]  . CATARACT EXTRACTION W/PHACO Left 07/12/2017   Procedure: CATARACT EXTRACTION PHACO AND INTRAOCULAR LENS PLACEMENT ([place]);  Surgeon: Birder Robson, MD;  Location: [facility] ORS;  Service: Ophthalmology;  Laterality: Left;  [place]  00:42 AP% 15.2 CDE 6.38 Fluid pack lot # [id number]  . DIAGNOSTIC LAPAROSCOPY    . FRACTURE SURGERY Left    wrist  . HERNIA REPAIR  5176   umbilical  . JOINT REPLACEMENT Bilateral   . KNEE CLOSED REDUCTION Left 12/30/2016    Procedure: CLOSED MANIPULATION KNEE;  Surgeon: Hessie Knows, MD;  Location: [facility] ORS;  Service: Orthopedics;  Laterality: Left;  . LAPAROTOMY    . TONSILLECTOMY    . TOTAL KNEE ARTHROPLASTY Right 07/15/2016   Procedure: TOTAL KNEE ARTHROPLASTY;  Surgeon: Hessie Knows, MD;  Location: [facility] ORS;  Service: Orthopedics;  Laterality: Right;  . TOTAL KNEE ARTHROPLASTY Left 10/26/2016   Procedure: TOTAL KNEE ARTHROPLASTY;  Surgeon: Hessie Knows, MD;  Location: [facility] ORS;  Service: Orthopedics;  Laterality: Left;    Prior to Admission medications   Medication Sig Start Date End Date Taking? Authorizing Provider  amitriptyline (ELAVIL) 25 MG tablet Take 75 mg by mouth at bedtime.  05/25/16  Yes [provider]  amLODipine (NORVASC) 10 MG tablet Take 10 mg by mouth at bedtime. 05/25/16  Yes [provider]  aspirin EC 81 MG tablet Take 81 mg by mouth at bedtime.   Yes [provider]  citalopram (CELEXA) 10 MG tablet Take 10 mg by mouth at bedtime. 04/02/16  Yes [provider]  hydrochlorothiazide (HYDRODIURIL) 25 MG tablet Take 25 mg by mouth at bedtime. 05/25/16  Yes [provider]  mirtazapine (REMERON) 30 MG tablet Take 30 mg by mouth at bedtime. 03/30/16  Yes [provider]  paroxetine mesylate (PEXEVA) 40 MG tablet Take 40 mg by mouth at  bedtime.   Yes [provider]  TRIUMEQ 600-50-300 MG tablet Take 1 tablet by mouth at bedtime. 04/28/16  Yes [provider]  gabapentin (NEURONTIN) 300 MG capsule Take 900 mg by mouth 2 (two) times daily. Patient not taking: Reported on 10/27/2020 03/29/16   [provider]  oxyCODONE-acetaminophen (PERCOCET/ROXICET) 5-325 MG tablet Take 1 tablet by mouth every 4 (four) hours as needed for severe pain. Patient not taking: Reported on 05/19/2020 04/12/18   Versie Starks, PA-C  potassium chloride SA (K-DUR,KLOR-CON) 20 MEQ tablet Take 1 tablet (20 mEq total) by mouth once for 1 dose. 04/12/18  04/12/18  Versie Starks, PA-C    Allergies as of 10/27/2020 - Review Complete 10/27/2020  Allergen Reaction Noted  . Penicillins Swelling and Rash 06/15/2016    Family History  Problem Relation Age of Onset  . Hypertension Father     Social History   Socioeconomic History  . Marital status: Widowed    Spouse name: Not on file  . Number of children: Not on file  . Years of education: Not on file  . Highest education level: Not on file  Occupational History  . Not on file  Tobacco Use  . Smoking status: Current Every Day Smoker    Packs/day: 0.50    Years: 45.00    Pack years: 22.50    Types: Cigarettes  . Smokeless tobacco: Never Used  Vaping Use  . Vaping Use: Never used  Substance and Sexual Activity  . Alcohol use: No  . Drug use: No  . Sexual activity: Yes  Other Topics Concern  . Not on file  Social History Narrative  . Not on file   Social Determinants of Health   Financial Resource Strain: Not on file  Food Insecurity: Not on file  Transportation Needs: Not on file  Physical Activity: Not on file  Stress: Not on file  Social Connections: Not on file  Intimate Partner Violence: Not on file    Review of Systems: See HPI, otherwise negative ROS  Physical Exam: BP (!) 128/91   Pulse (!) 104   Temp (!) 97.1 F (36.2 C) (Temporal)   Resp 15   Ht 5\' 3"  (1.6 m)   Wt 104.3 kg   SpO2 98%   BMI 40.74 kg/m  General:   Alert,  pleasant and cooperative in NAD Head:  Normocephalic and atraumatic. Neck:  Supple; no masses or thyromegaly. Lungs:  Clear throughout to auscultation, normal respiratory effort.    Heart:  +S1, +S2, Regular rate and rhythm, No edema. Abdomen:  Soft, nontender and nondistended. Normal bowel sounds, without guarding, and without rebound.   Neurologic:  Alert and  oriented x4;  grossly normal neurologically.  Impression/Plan: Lisa Hawkins is here for an colonoscopy to be performed for fecal incontience.Risks, benefits,  limitations, and alternatives regarding  colonoscopy have been reviewed with the patient.  Questions have been answered.  All parties agreeable.   Jonathon Bellows, MD  12/09/2020, 10:36 AM

## 2020-12-09 NOTE — Anesthesia Preprocedure Evaluation (Signed)
Anesthesia Evaluation  Patient identified by MRN, date of birth, ID band Patient awake    Reviewed: Allergy & Precautions, NPO status , Patient's Chart, lab work & pertinent test results  History of Anesthesia Complications Negative for: history of anesthetic complications  Airway Mallampati: III       Dental  (+) Upper Dentures, Lower Dentures   Pulmonary neg sleep apnea, neg COPD, Current Smoker and Patient abstained from smoking.,           Cardiovascular hypertension, Pt. on medications + Past MI  (-) CHF (-) dysrhythmias (-) Valvular Problems/Murmurs     Neuro/Psych Seizures - (last seizure 1 week ago), Poorly Controlled,  Anxiety Depression CVA, No Residual Symptoms    GI/Hepatic GERD  Medicated and Controlled,(+) Hepatitis -, C  Endo/Other  neg diabetes  Renal/GU Renal InsufficiencyRenal disease     Musculoskeletal   Abdominal   Peds  Hematology   Anesthesia Other Findings   Reproductive/Obstetrics                             Anesthesia Physical Anesthesia Plan  ASA: III  Anesthesia Plan: General   Post-op Pain Management:    Induction: Intravenous  PONV Risk Score and Plan: 2 and Propofol infusion and TIVA  Airway Management Planned: Nasal Cannula  Additional Equipment:   Intra-op Plan:   Post-operative Plan:   Informed Consent: I have reviewed the patients History and Physical, chart, labs and discussed the procedure including the risks, benefits and alternatives for the proposed anesthesia with the patient or authorized representative who has indicated his/her understanding and acceptance.       Plan Discussed with:   Anesthesia Plan Comments:         Anesthesia Quick Evaluation

## 2020-12-09 NOTE — Op Note (Signed)
[facility] Gastroenterology Patient Name: Lisa Hawkins Procedure Date: 12/09/2020 9:39 AM MRN: [id number] Account #: [id number] Date of Birth: 1951/02/12 Admit Type: Outpatient Age: 70 Room: [facility] ENDO ROOM 2 Gender: Female Note Status: Finalized Procedure:             Colonoscopy Indications:           Change in bowel habits Providers:             Jonathon Bellows MD, MD Referring MD:          Lisa Lynch. Aycock MD (Referring MD) Medicines:             Monitored Anesthesia Care Complications:         No immediate complications. Procedure:             Pre-Anesthesia Assessment:                        - Prior to the procedure, a History and Physical was                         performed, and patient medications, allergies and                         sensitivities were reviewed. The patient's tolerance                         of previous anesthesia was reviewed.                        - The risks and benefits of the procedure and the                         sedation options and risks were discussed with the                         patient. All questions were answered and informed                         consent was obtained.                        - ASA Grade Assessment: III - A patient with severe                         systemic disease.                        - After reviewing the risks and benefits, the patient                         was deemed in satisfactory condition to undergo the                         procedure.                        After obtaining informed consent, the colonoscope was                         passed under direct vision. Throughout the procedure,  the patient's blood pressure, pulse, and oxygen                         saturations were monitored continuously. The                         Colonoscope was introduced through the anus and                         advanced to the the cecum, identified by the                          appendiceal orifice. The colonoscopy was performed                         with ease. The patient tolerated the procedure well.                         The quality of the bowel preparation was poor. Findings:      The perianal and digital rectal examinations were normal.      A large amount of semi-liquid stool was found in the entire colon,       precluding visualization.      no large lumen obstructing lesions, prep inadequaqte for colon polyp       detection . Likely incontinence from constipation Impression:            - Preparation of the colon was poor.                        - Stool in the entire examined colon.                        - No specimens collected. Recommendation:        - Discharge patient to home (with escort).                        - Resume previous diet.                        - Continue present medications.                        - Return to my office as previously scheduled. Procedure Code(s):     --- Professional ---                        [phone], Colonoscopy, flexible; diagnostic, including                         collection of specimen(s) by brushing or washing, when                         performed (separate procedure) Diagnosis Code(s):     --- Professional ---                        R19.4, Change in bowel habit CPT copyright 2019 American Medical Association. All rights reserved. The codes documented in this report are preliminary and upon coder review may  be revised to meet current compliance requirements. Bailey Mech  Vicente Males, MD Jonathon Bellows MD, MD 12/09/2020 10:55:00 AM This report has been signed electronically. Number of Addenda: 0 Note Initiated On: 12/09/2020 9:39 AM Scope Withdrawal Time: 0 hours 3 minutes 19 seconds  Total Procedure Duration: 0 hours 12 minutes 23 seconds  Estimated Blood Loss:  Estimated blood loss: none.      [facility]

## 2020-12-10 ENCOUNTER — Encounter: Payer: Self-pay | Admitting: Gastroenterology

## 2021-04-09 ENCOUNTER — Encounter: Payer: Self-pay | Admitting: Gastroenterology

## 2021-04-09 ENCOUNTER — Ambulatory Visit: Payer: Medicare HMO | Admitting: Gastroenterology

## 2021-04-09 NOTE — Progress Notes (Deleted)
Jonathon Bellows MD, MRCP(U.K) [address]  [place]  [place], [place] [address]  Main: [phone]  Fax: [phone]   Primary Care Physician: Donnie Coffin, MD  Primary Gastroenterologist:  Dr. Jonathon Bellows   Summary of history :  Previously seen at the office in November 2021 for fecal incontinence. 02/04/2020 MRI of the lumbar spine without contrast shows disc bulge with superimposed left central disc extrusion at the L5-S1 level abutting the transversing left S1 nerve root.  Moderate bilateral neural foraminal narrowing at L4-L5 and L5-S1.  05/19/2020: B12 normal   She states that she has had issues with bowel incontinence since the age of 37 but is gradually got worse.  She also has urinary incontinence.  She can make it to the restroom on time in terms of passing her urine but in terms of her bowel incontinence does not have any recollection of when she would do so.  She has a bowel movement once every 5 days which is very hard in nature occasionally mixed with blood.  Takes 1 tablespoon of cooking oil daily for constipation.  Not tried any other medications.  She has a history of HIV.  She also complains of decreased sensation in both her feet   Interval history 10/27/2020-04/09/2021  12/09/2020: Colonoscopy: Large amount of semisolid stool seen throughout the entire colon.  HPI: Lisa Hawkins is a 70 y.o. female  Current Outpatient Medications  Medication Sig Dispense Refill  . amitriptyline (ELAVIL) 25 MG tablet Take 75 mg by mouth at bedtime.   0  . amLODipine (NORVASC) 10 MG tablet Take 10 mg by mouth at bedtime.  0  . aspirin EC 81 MG tablet Take 81 mg by mouth at bedtime.    . citalopram (CELEXA) 10 MG tablet Take 10 mg by mouth at bedtime.  0  . gabapentin (NEURONTIN) 300 MG capsule Take 900 mg by mouth 2 (two) times daily. (Patient not taking: Reported on 10/27/2020)  0  . hydrochlorothiazide (HYDRODIURIL) 25 MG tablet Take 25 mg by mouth at bedtime.  0  .  mirtazapine (REMERON) 30 MG tablet Take 30 mg by mouth at bedtime.  0  . oxyCODONE-acetaminophen (PERCOCET/ROXICET) 5-325 MG tablet Take 1 tablet by mouth every 4 (four) hours as needed for severe pain. (Patient not taking: Reported on 05/19/2020) 30 tablet 0  . paroxetine mesylate (PEXEVA) 40 MG tablet Take 40 mg by mouth at bedtime.    . potassium chloride SA (K-DUR,KLOR-CON) 20 MEQ tablet Take 1 tablet (20 mEq total) by mouth once for 1 dose. 30 tablet 0  . TRIUMEQ 600-50-300 MG tablet Take 1 tablet by mouth at bedtime.  1   No current facility-administered medications for this visit.    Allergies as of 04/09/2021 - Review Complete 12/09/2020  Allergen Reaction Noted  . Penicillins Swelling and Rash 06/15/2016    ROS:  General: Negative for anorexia, weight loss, fever, chills, fatigue, weakness. ENT: Negative for hoarseness, difficulty swallowing , nasal congestion. CV: Negative for chest pain, angina, palpitations, dyspnea on exertion, peripheral edema.  Respiratory: Negative for dyspnea at rest, dyspnea on exertion, cough, sputum, wheezing.  GI: See history of present illness. GU:  Negative for dysuria, hematuria, urinary incontinence, urinary frequency, nocturnal urination.  Endo: Negative for unusual weight change.    Physical Examination:   There were no vitals taken for this visit.  General: Well-nourished, well-developed in no acute distress.  Eyes: No icterus. Conjunctivae pink. Mouth: Oropharyngeal mucosa moist and pink ,  no lesions erythema or exudate. Lungs: Clear to auscultation bilaterally. Non-labored. Heart: Regular rate and rhythm, no murmurs rubs or gallops.  Abdomen: Bowel sounds are normal, nontender, nondistended, no hepatosplenomegaly or masses, no abdominal bruits or hernia , no rebound or guarding.   Extremities: No lower extremity edema. No clubbing or deformities. Neuro: Alert and oriented x 3.  Grossly intact. Skin: Warm and dry, no jaundice.   Psych:  Alert and cooperative, normal mood and affect.   Imaging Studies: No results found.  Assessment and Plan:   Lisa Hawkins is a 70 y.o. y/o female ***  Foraminal impingement at L4-L5, L5-S1 seen on MRI back in March 2021.  She has a history of bowel and bladder incontinence ongoing for many years but gradually worsening.  She also has constipation.  Differentials include neurogenic incontinence secondary to nerve entrapment at L4-L5/L5-S1 level.  In addition constipation may be playing a role with overflow diarrhea.  This also could be due to nerve entrapment.  Due to the history of rectal bleeding I would suggest we proceed with an endoscopy and commence her on Linzess to treat any underlying constipation.  I will perform a detailed rectal exam on the day of her procedure.  She would probably benefit from seeing a Armed forces technical officer.  She would also probably benefit from evaluation of other forms of neuropathy with Aycock, Ngwe A, MD .  Her B12 levels is borderline as it is less than 400  and she probably would benefit from having her methylmalonic acid and homocysteine levels checked by Aycock, Ngwe A, MD  and if her B12 levels are proven to be low could have it replaced.  Dr Jonathon Bellows  MD,MRCP [facility]) Follow up in ***

## 2021-07-29 IMAGING — CR DG LUMBAR SPINE COMPLETE W/ BEND
1 series · 6 of 6 positions shown · non-contrast
Comparison: MR lumbar spine, 02/04/2020, lumbar spine radiographs,
02/22/2009

CLINICAL DATA: Low back pain for 9 years, radiating down legs, no
known injury

EXAM:
LUMBAR SPINE - COMPLETE WITH BENDING VIEWS

[Series 1: dg lumbar spine complete w/bend 6+v · 0.14mm/px · 6 of 6 slices shown]
[im 1/6]
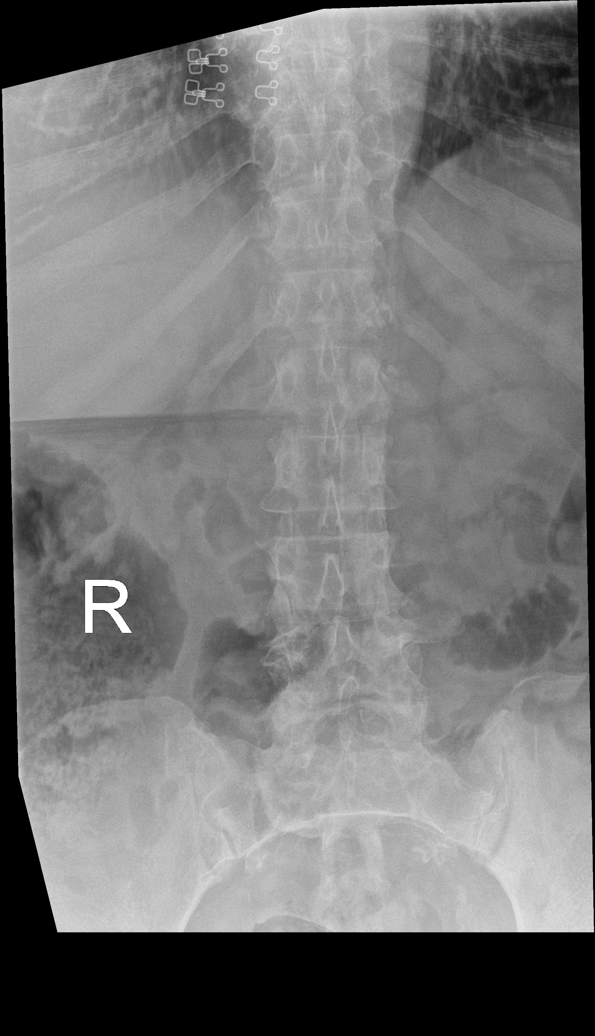
[im 2/6]
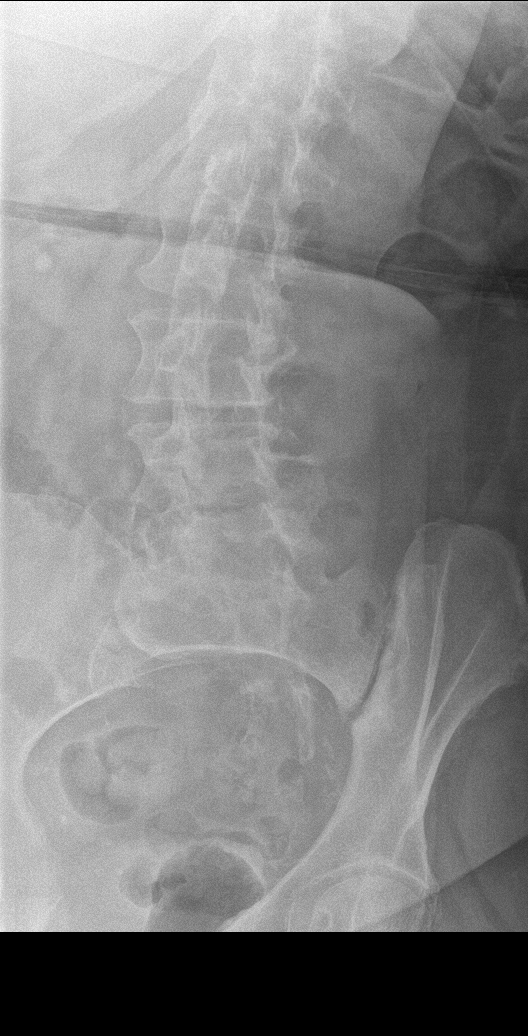
[im 3/6]
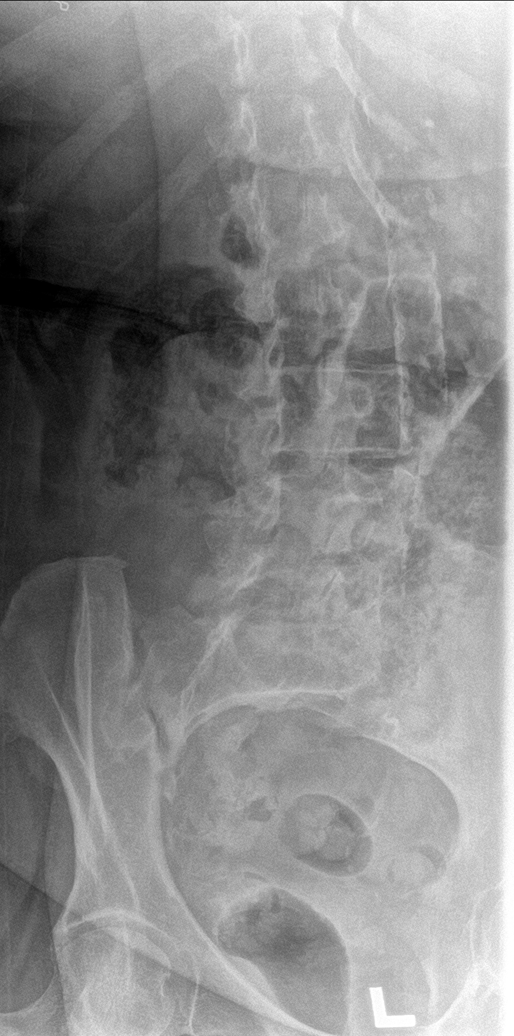
[im 4/6]
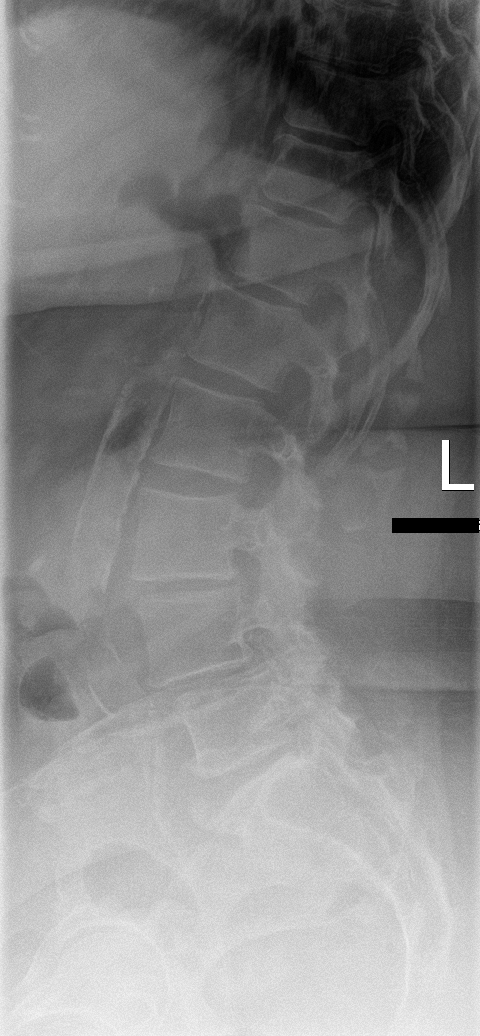
[im 5/6]
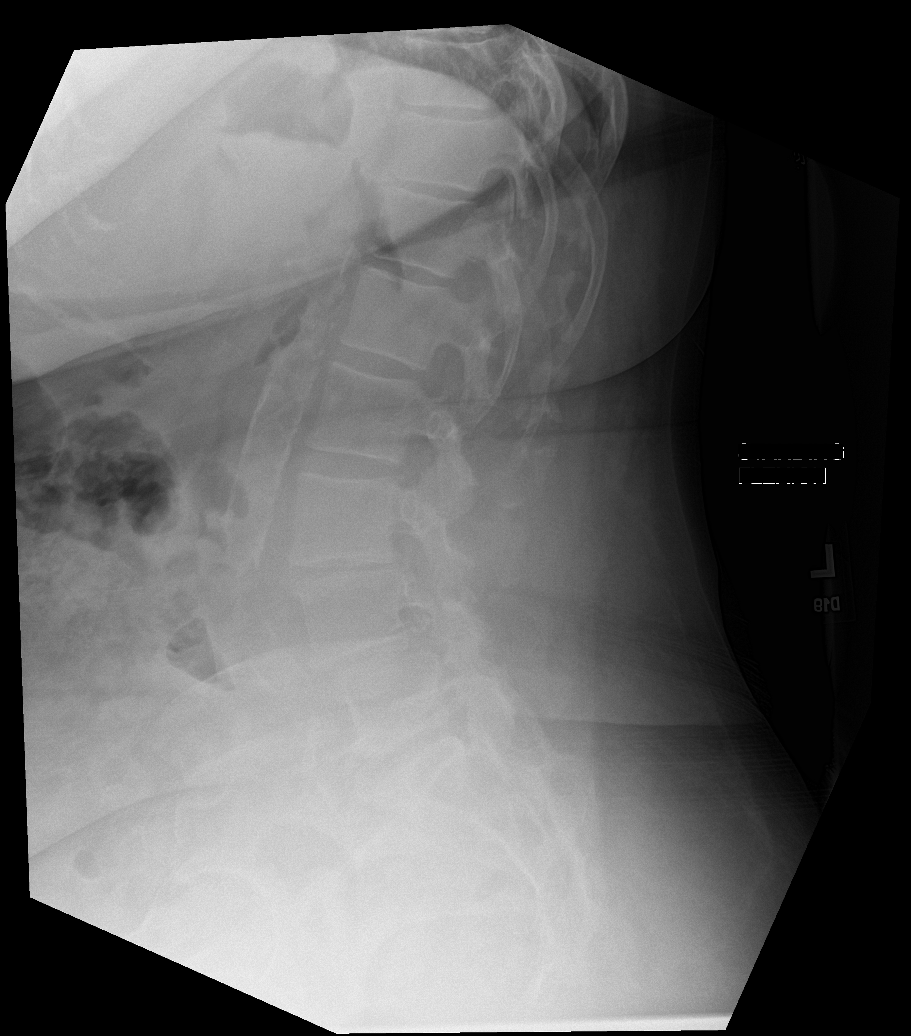
[im 6/6]
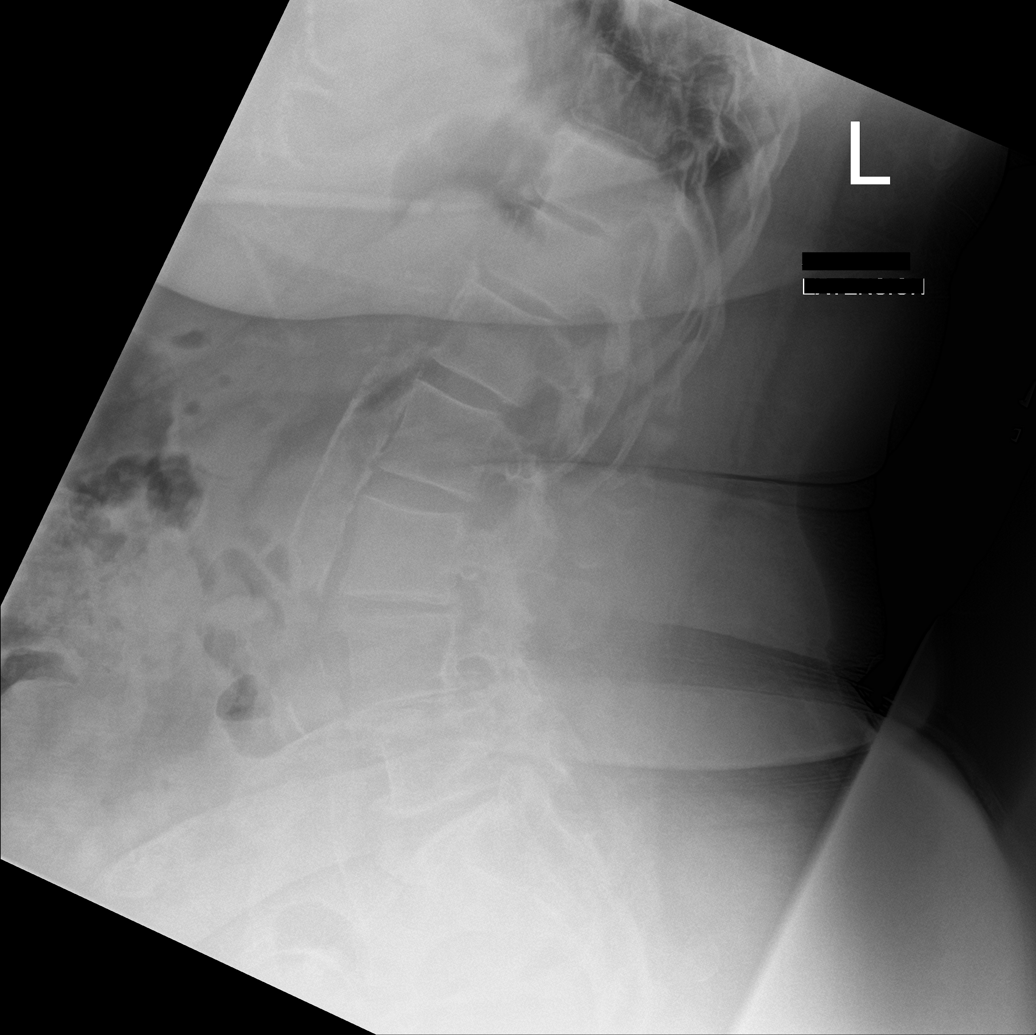

[6 of 6 positions shown; findings below may reference images not displayed]

FINDINGS: No fracture or static subluxation of the lumbar spine. There is
approximately 8 mm, less than 25% degenerative anterolisthesis of L4
on L5. There is moderate disc space height loss and osteophytosis at
L3-L4 and L4-L5, with relatively preserved disc spaces elsewhere.
Moderate to severe facet degenerative change of the lower lumbar
levels. Degenerative findings are overall substantially worsened in
comparison to prior radiographs dated 02/22/2009. There is no
dynamic listhesis noted, however there is essentially no excursion
on flexion and extension views submitted for review. Aortic
atherosclerosis.
IMPRESSION: 1. There is approximately 8 mm, less than 25% degenerative
anterolisthesis of L4 on L5.
2. Moderate disc space height loss and osteophytosis at L3-L4 and
L4-L5, with relatively preserved disc spaces elsewhere.
3. Moderate to severe facet degenerative change of the lower lumbar
levels.
4. Degenerative findings are substantially worsened in comparison to
prior radiographs dated 02/22/2009.
5. There is no dynamic listhesis noted, however there is essentially
no excursion on flexion and extension views submitted for review.
6.  Aortic Atherosclerosis ([id number]).

## 2021-07-29 IMAGING — CR DG KNEE 1-2V*L*
1 series · 2 of 2 positions shown · non-contrast
Comparison: 10/26/2016

CLINICAL DATA: Left knee pain, no known injury, initial encounter

EXAM:
LEFT KNEE - 1-2 VIEW

[Series 1: dg knee 1-2 views left · 0.14mm/px · 2 of 2 slices shown]
[im 1/2]
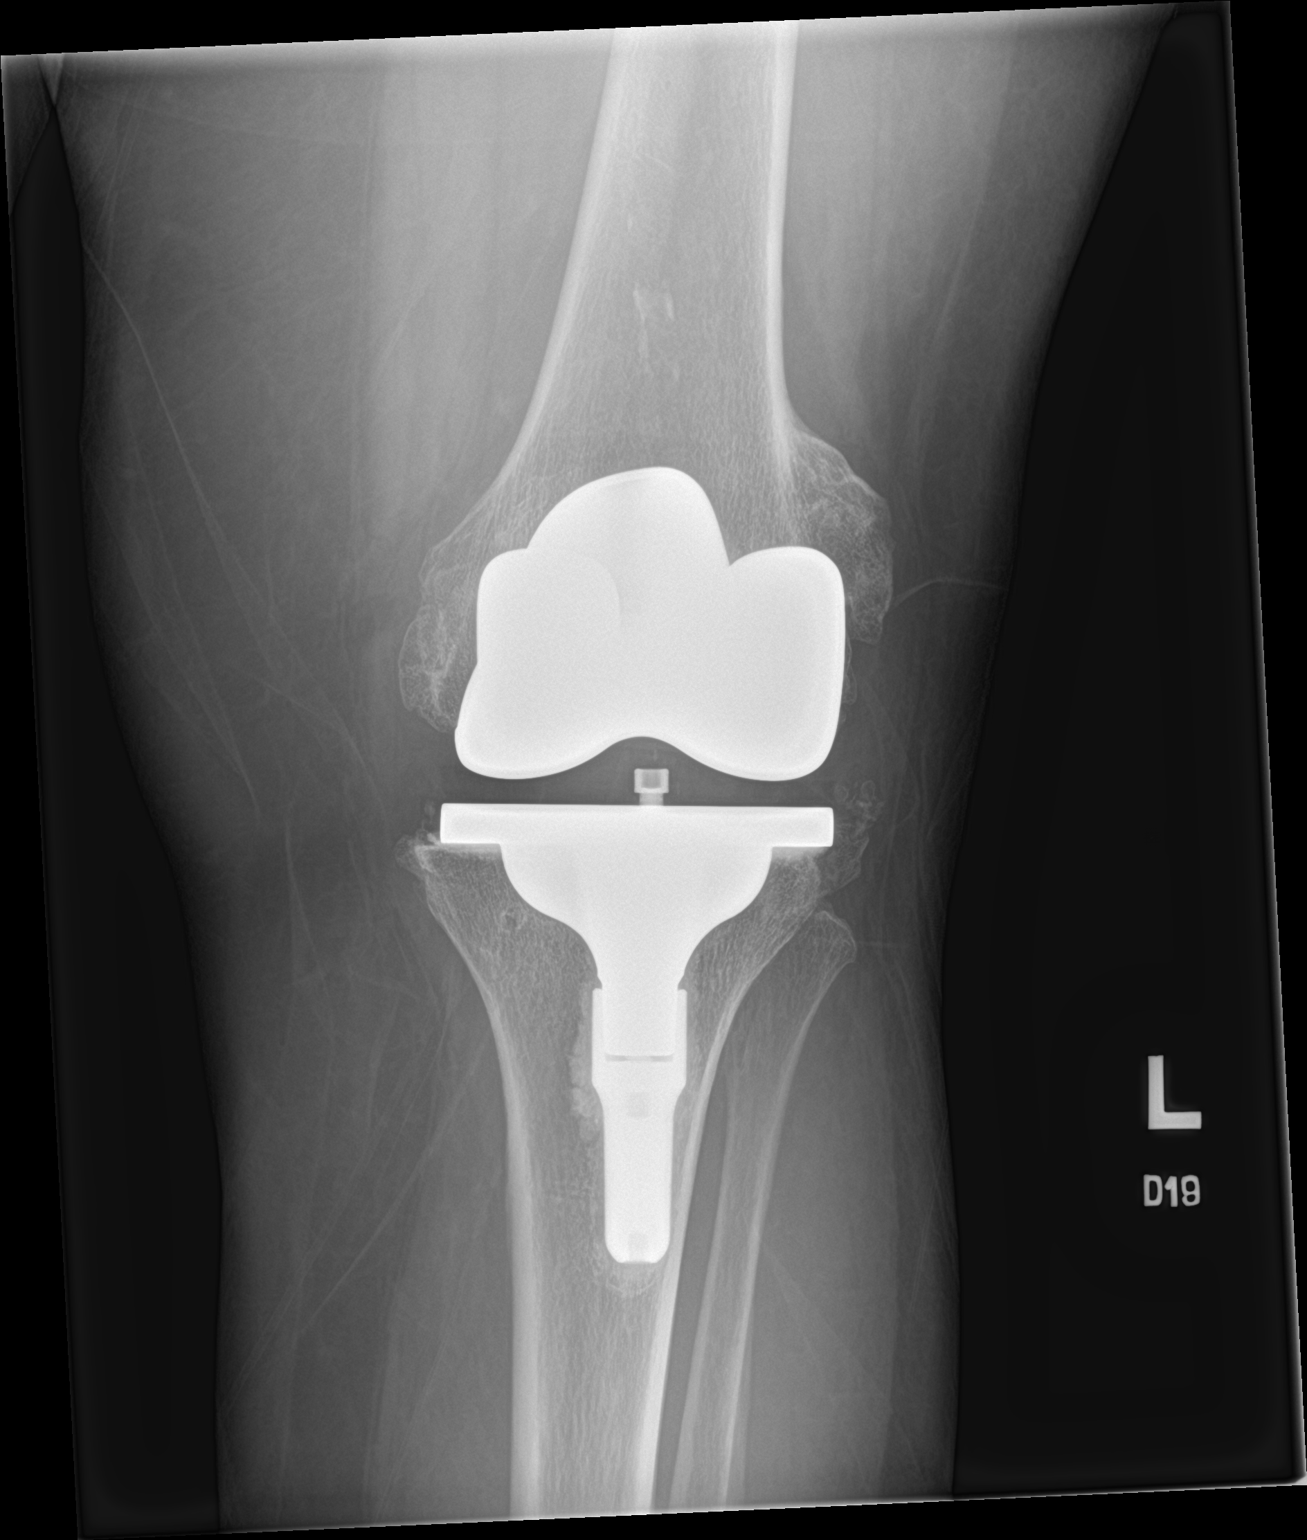
[im 2/2]
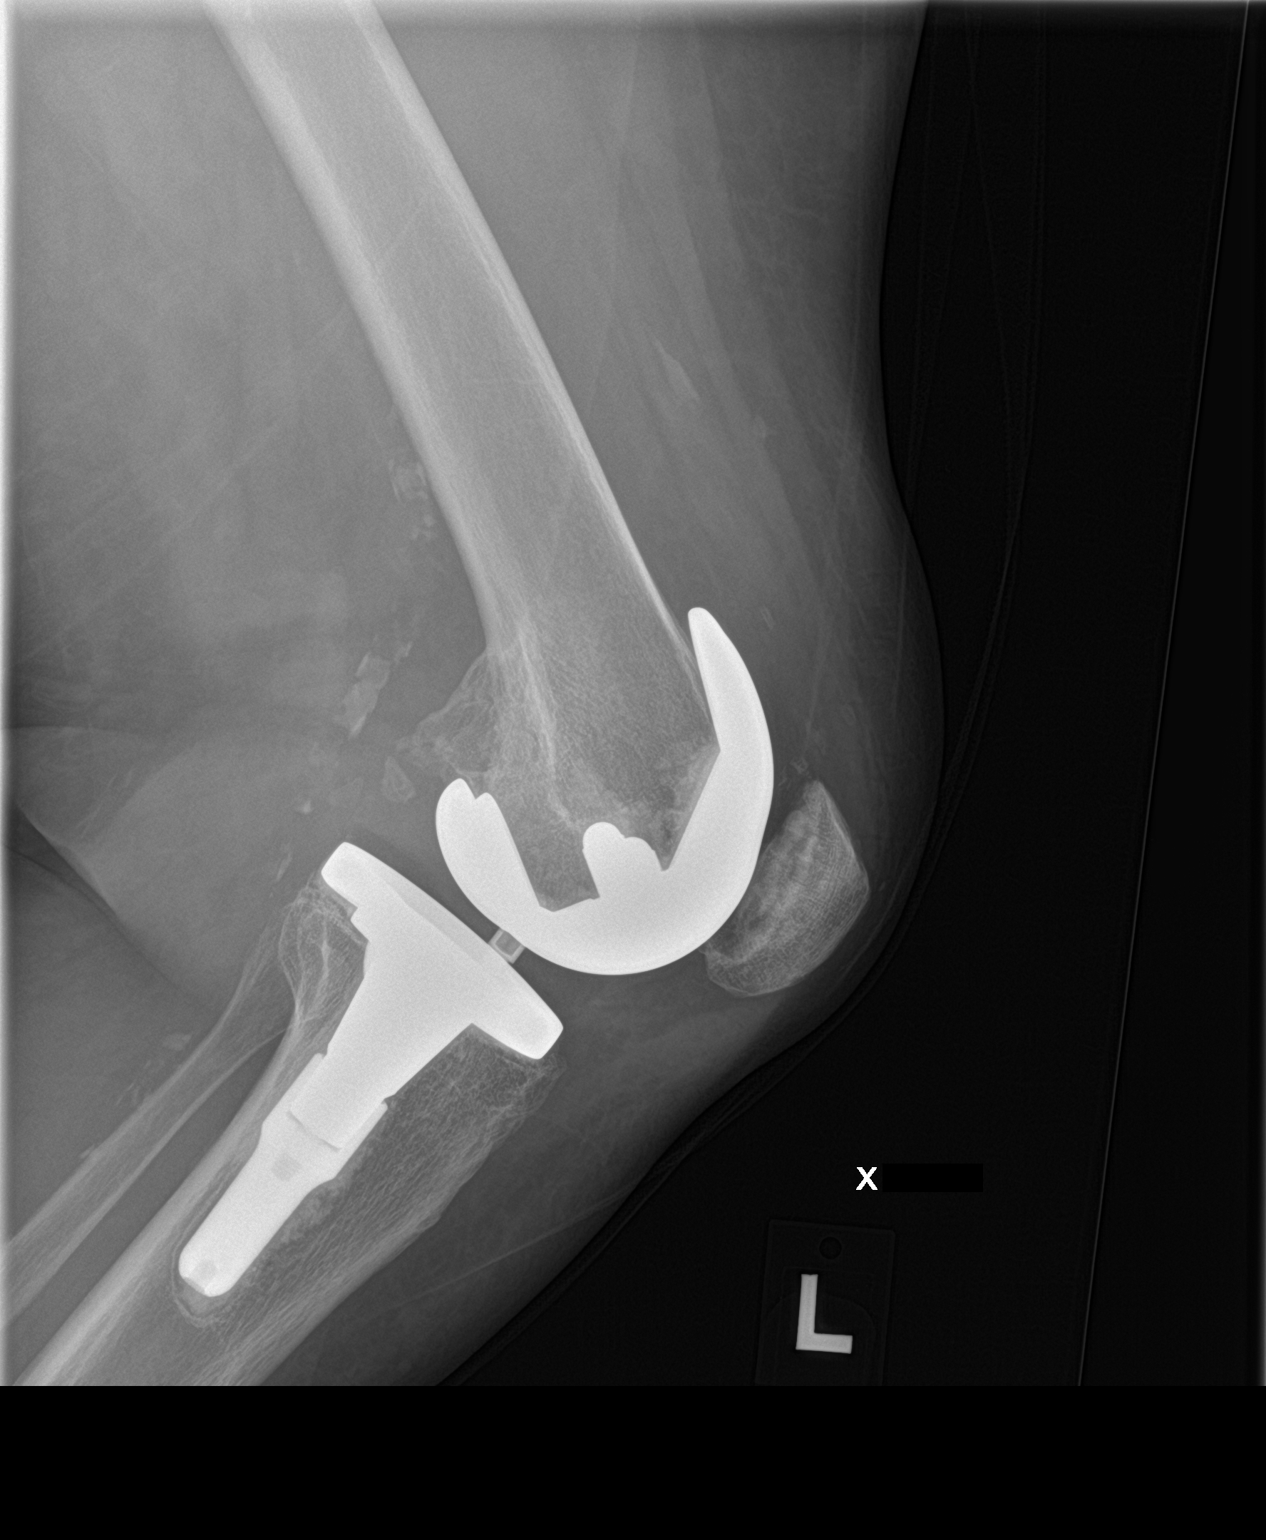

[2 of 2 positions shown; findings below may reference images not displayed]

FINDINGS: Left knee replacement is noted and stable. No joint effusion is
seen. No acute fracture or dislocation is noted.
IMPRESSION: No acute abnormality noted.

## 2021-07-29 IMAGING — CR DG KNEE 1-2V*R*
1 series · 2 of 2 positions shown · non-contrast
Comparison: 07/15/2016

CLINICAL DATA: Right knee pain

EXAM:
RIGHT KNEE - 1-2 VIEW

[Series 1: dg knee 1-2 views right · 0.14mm/px · 2 of 2 slices shown]
[im 1/2]
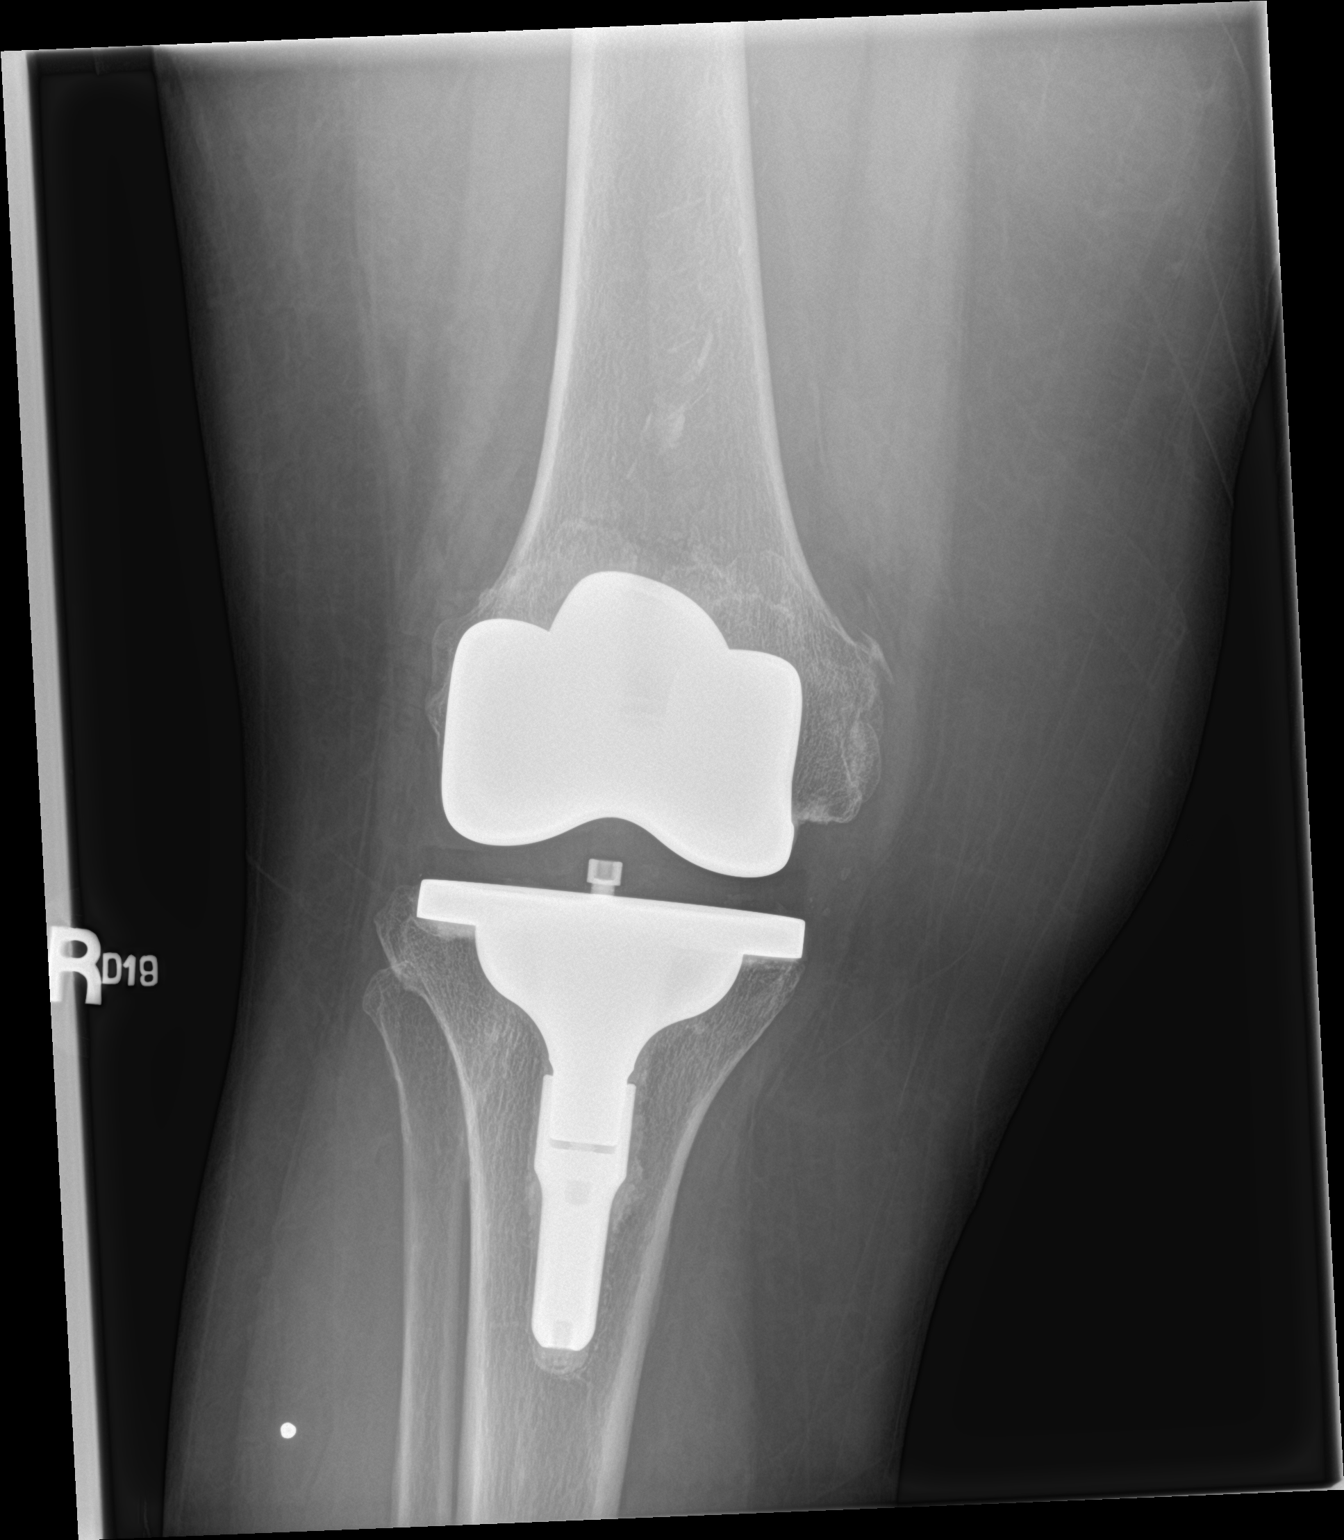
[im 2/2]
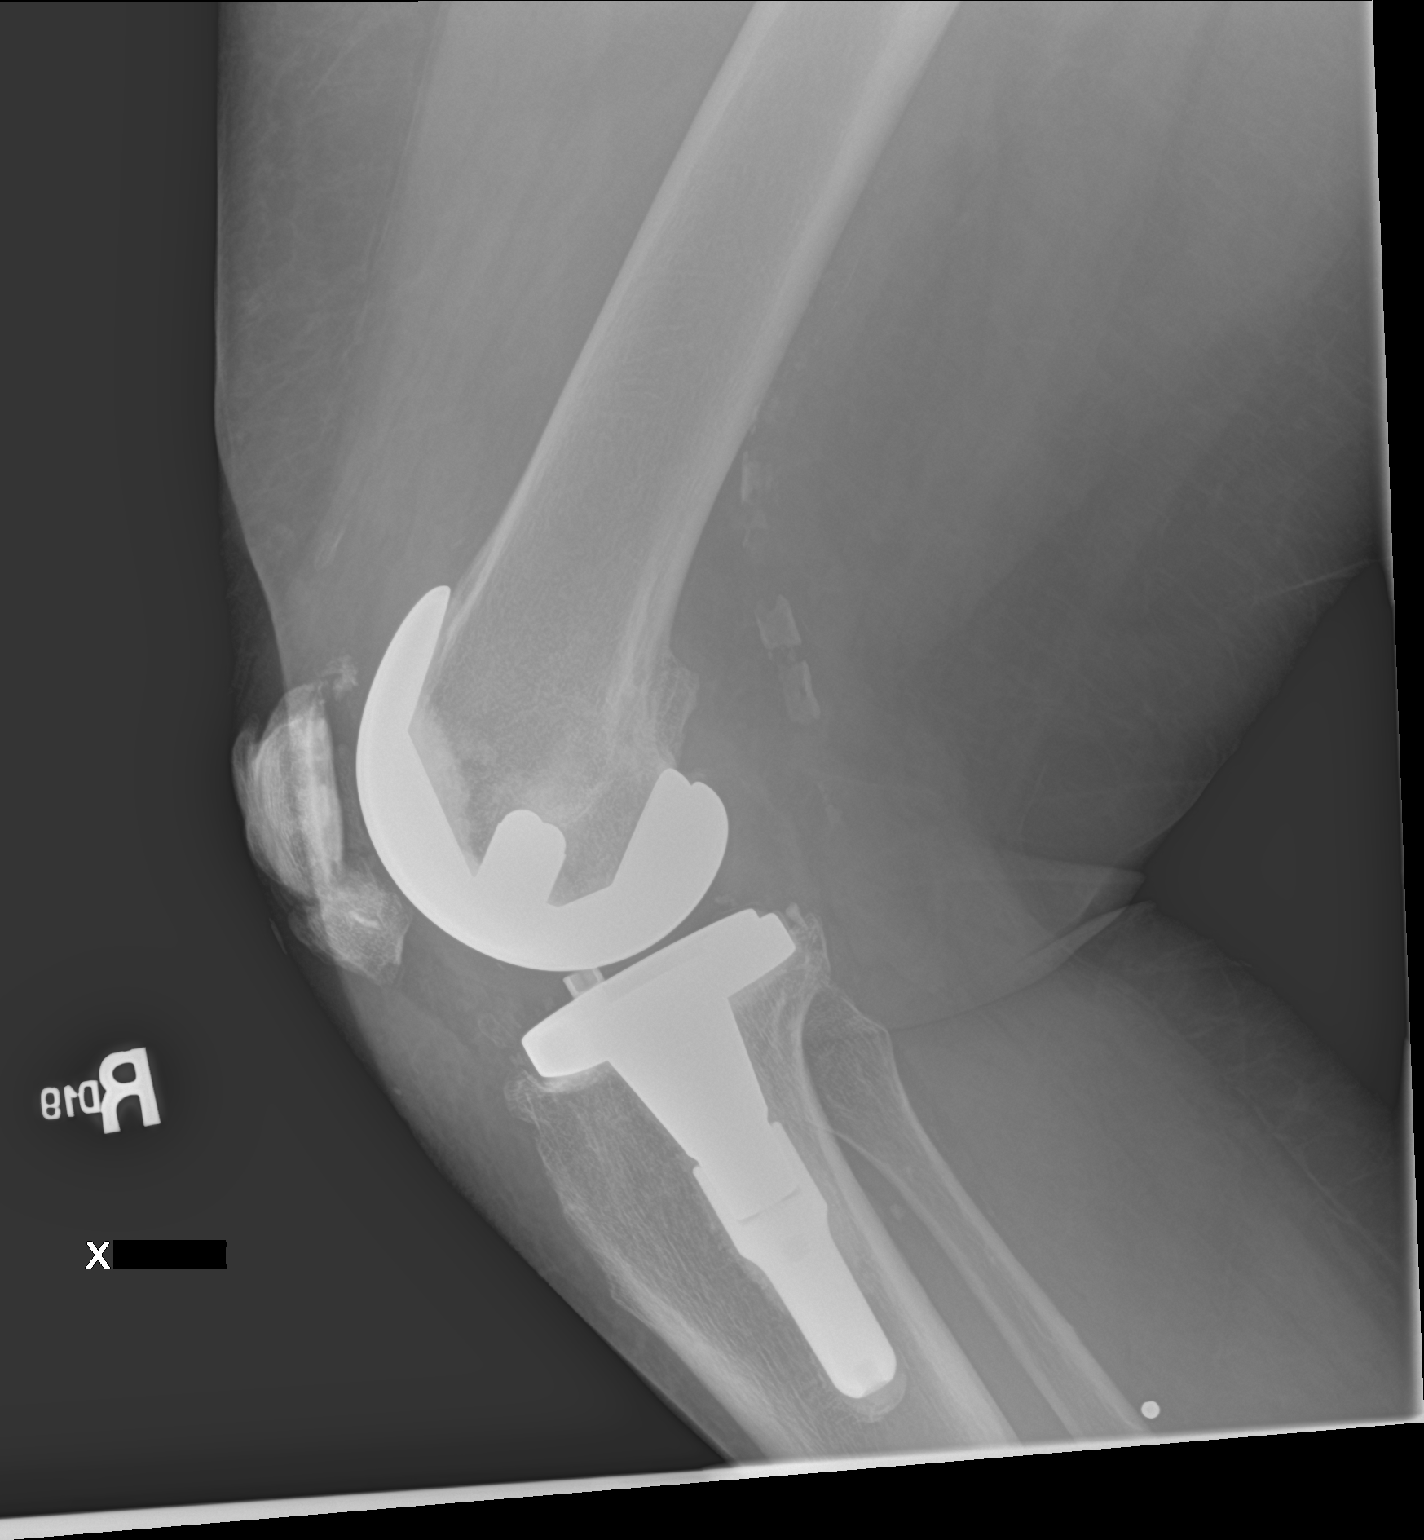

[2 of 2 positions shown; findings below may reference images not displayed]

FINDINGS: Postsurgical changes are again seen. No findings to suggest
loosening are noted. Some dystrophic calcification along the
inferior aspect of the patella is seen new from the prior exam.
Vascular calcifications are noted. No joint effusion is seen.
IMPRESSION: New dystrophic calcification along the inferior aspect of the
patella.

Stable appearing knee replacement.

## 2021-09-24 ENCOUNTER — Emergency Department: Payer: Medicare HMO

## 2021-09-24 ENCOUNTER — Emergency Department
Admission: EM | Admit: 2021-09-24 | Discharge: 2021-09-24 | Disposition: A | Discharge status: Home / Self Care | Payer: Medicare HMO | Attending: Emergency Medicine | Admitting: Emergency Medicine

## 2021-09-24 ENCOUNTER — Other Ambulatory Visit: Payer: Self-pay

## 2021-09-24 DIAGNOSIS — F17210 Nicotine dependence, cigarettes, uncomplicated: Secondary | ICD-10-CM | POA: Diagnosis not present

## 2021-09-24 DIAGNOSIS — Z96653 Presence of artificial knee joint, bilateral: Secondary | ICD-10-CM | POA: Insufficient documentation

## 2021-09-24 DIAGNOSIS — R0602 Shortness of breath: Secondary | ICD-10-CM | POA: Diagnosis not present

## 2021-09-24 DIAGNOSIS — M79602 Pain in left arm: Secondary | ICD-10-CM | POA: Insufficient documentation

## 2021-09-24 DIAGNOSIS — Z21 Asymptomatic human immunodeficiency virus [HIV] infection status: Secondary | ICD-10-CM | POA: Diagnosis not present

## 2021-09-24 DIAGNOSIS — I10 Essential (primary) hypertension: Secondary | ICD-10-CM | POA: Insufficient documentation

## 2021-09-24 DIAGNOSIS — Z7982 Long term (current) use of aspirin: Secondary | ICD-10-CM | POA: Insufficient documentation

## 2021-09-24 DIAGNOSIS — Z79899 Other long term (current) drug therapy: Secondary | ICD-10-CM | POA: Diagnosis not present

## 2021-09-24 LAB — CBC
HCT: 40.7 % (ref 36.0–46.0)
Hemoglobin: 14.3 g/dL (ref 12.0–15.0)
MCH: 33.7 pg (ref 26.0–34.0)
MCHC: 35.1 g/dL (ref 30.0–36.0)
MCV: 96 fL (ref 80.0–100.0)
Platelets: 371 10*3/uL (ref 150–400)
RBC: 4.24 MIL/uL (ref 3.87–5.11)
RDW: 14.9 % (ref 11.5–15.5)
WBC: 9 10*3/uL (ref 4.0–10.5)
nRBC: 0 % (ref 0.0–0.2)

## 2021-09-24 LAB — BASIC METABOLIC PANEL
Anion gap: 8 (ref 5–15)
BUN: 10 mg/dL (ref 8–23)
CO2: 29 mmol/L (ref 22–32)
Calcium: 9.4 mg/dL (ref 8.9–10.3)
Chloride: 101 mmol/L (ref 98–111)
Creatinine, Ser: 0.8 mg/dL (ref 0.44–1.00)
GFR, Estimated: >60 mL/min (ref >60)
Glucose, Bld: 89 mg/dL (ref 70–99)
Potassium: 3.7 mmol/L (ref 3.5–5.1)
Sodium: 138 mmol/L (ref 135–145)

## 2021-09-24 LAB — TROPONIN I (HIGH SENSITIVITY)
Troponin I (High Sensitivity): 5 ng/L (ref <18)
Troponin I (High Sensitivity): 4 ng/L (ref <18)

## 2021-09-24 MED ORDER — GABAPENTIN 300 MG PO CAPS
300.0000 mg | ORAL_CAPSULE | Freq: Once | ORAL | Status: AC
  Administered 2021-09-24: 300 mg via ORAL
  Filled 2021-09-24: qty 1

## 2021-09-24 NOTE — Discharge Instructions (Signed)
Please seek medical attention for any high fevers, chest pain, shortness of breath, change in behavior, persistent vomiting, bloody stool or any other new or concerning symptoms.  

## 2021-09-24 NOTE — ED Provider Notes (Signed)
Emergency Medicine Provider Triage Evaluation Note  Lisa Hawkins, a 70 y.o. female  was evaluated in triage.  Pt complains of left arm pain and some associated shortness of breath.  Patient presents via EMS from [facility] health services with now resolved left arm pain patient did endorse at the time of onset, some central chest pain or shortness of breath.  She presents now with symptoms resolved for evaluation of her initial complaint.  Review of Systems  Positive: LUE pain, CP Negative: Weakness, paralysis  Physical Exam  There were no vitals taken for this visit. Gen:   Awake, no distress  NAD Resp:  Normal effort CTA MSK:   Moves extremities without difficulty  Other:  CVS: RRR  Medical Decision Making  Medically screening exam initiated at 3:49 PM.  Appropriate orders placed.  [name] was informed that the remainder of the evaluation will be completed by another provider, this initial triage assessment does not replace that evaluation, and the importance of remaining in the ED until their evaluation is complete.  Geriatric patient ED evaluation of central chest pain and left arm pain.   Melvenia Needles, PA-C 09/24/21 1551    Vanessa [place], MD 09/24/21 1730

## 2021-09-24 NOTE — ED Notes (Signed)
Pt given sandwich tray, water and PO meds at this time.

## 2021-09-24 NOTE — ED Provider Notes (Signed)
[facility] Emergency Department Provider Note   ____________________________________________   I have reviewed the triage vital signs and the nursing notes.   HISTORY  Chief Complaint Chest Pain (Left arm pain )   History limited by: Not Limited   HPI Lisa Hawkins is a 70 y.o. female who presents to the emergency department today from [place] because of concerns for left arm pain and possible cardiac etiology.  Patient states that she has been having issues with left arm pain for months.  She states it starts in the shoulder and goes down her hand.  She denies any chest pain to myself.  She has had some shortness of breath with this.  She states she has a history of heart attack a couple of decades ago.  She did have arm pain at that time.  Additionally the patient states that she is addicted to cocaine and use some recently.    Records reviewed. Per medical record review patient has a history of MI, neuropathy.  Past Medical History:  Diagnosis Date   Anxiety    Arthritis    Bronchitis    Chronic bilateral low back pain with bilateral sciatica 5/73/2202   Complication of anesthesia    vomitting   Dysrhythmia    hx of flutter, no longer present   Elevated lipids    GERD (gastroesophageal reflux disease)    does not take meds but has gerd pretty bad   Hepatitis    Hep C - treated with Harvoni   HIV (human immunodeficiency virus infection) ([place])    HOH (hard of hearing)    Hypertension    Myocardial infarction ([place]) 1993   Neuropathy    Seizures ([place])    not considered seizures but does "black out" and unsure of what happened. happens when she overheats. last episode 4 days ago.   Stroke [facility])    no defecits    Patient Active Problem List   Diagnosis Date Noted   Class 3 severe obesity with serious comorbidity and body mass index (BMI) of 45.0 to 49.9 in adult ([place]) 06/08/2020   Elevated sed rate 06/08/2020   Elevated C-reactive  protein (CRP) 06/08/2020   Abnormal MRI, lumbar spine (02/04/2020) 05/19/2020   Kidney lesion (Left) 05/19/2020   Osteoarthritis of knee (Bilateral) 05/19/2020   Lumbar Grade 1 Anterolisthesis of L4/L5 05/19/2020   Lumbar facet arthropathy (Multilevel) (Bilateral) 05/19/2020   Lumbar foraminal stenosis (Multilevel) (Bilateral) 05/19/2020   Lumbar lateral recess stenosis (L3-4) (Right) 05/19/2020   Lumbar central spinal stenosis w/o neurogenic claudication (L4-5) 05/19/2020   DDD (degenerative disc disease), lumbosacral 05/19/2020   Lumbosacral IVDD (intervertebral disc displacement) 05/19/2020   History of cocaine use 05/19/2020   Chronic low back pain (1ry area of Pain) (Bilateral) (R>L) w/ sciatica (Bilateral) 05/19/2020   Chronic lower extremity pain (2ry area of Pain) (Bilateral) (R>L) 05/19/2020   Chronic knee pain after total knee replacement (Bilateral) (L>R) 05/19/2020   Chronic hip pain (3ry area of Pain) (Bilateral) (R>L) 05/19/2020   Chronic sacroiliac joint pain (Bilateral) (R>L) 05/19/2020   Lumbar facet syndrome (Bilateral) (R>L) 05/19/2020   Chronic pain syndrome 05/18/2020   Pharmacologic therapy 05/18/2020   Disorder of skeletal system 05/18/2020   Problems influencing health status 05/18/2020   Trigger ring finger of left hand 01/25/2017   Altered mental status 10/28/2016   Wheezing 10/28/2016   Dyspnea 10/28/2016   Acute respiratory failure with hypoxia ([place]) 10/28/2016   Hyponatremia 10/28/2016   Hepatomegaly  10/28/2016   Osteoarthritis of knee (Left) 10/26/2016   Osteoarthritis of knee (Right) 07/15/2016   Abnormal ECG 05/17/2016   Chronic knee pain (Bilateral) 05/17/2016   Exertional shortness of breath 03/30/2016   Compensated cirrhosis related to hepatitis C virus (HCV) ([place]) 12/22/2015   Kidney disease 06/18/2014   Tinea 03/07/2014   Routine adult health maintenance 11/26/2013   Tobacco use disorder 09/30/2013   Dizziness 09/28/2013   Hypertension  09/28/2013   Obesity 09/28/2013   Onychomycosis 09/28/2013   Osteoarthritis, knee (Left) 09/28/2013   Calcaneal spur 02/26/2013   Drug addiction syndrome ([place]) 10/05/2011   Displacement of lumbar intervertebral disc 07/11/2006   Cerebral artery occlusion with cerebral infarction ([place]) 01/11/2006   PPD positive 02/08/2004   Depressive disorder 08/11/1999   HIV (human immunodeficiency virus infection) ([place]) 05/06/1999    Past Surgical History:  Procedure Laterality Date   ABDOMINAL HYSTERECTOMY  1970   BACK SURGERY     no metal   CATARACT EXTRACTION W/PHACO Right 06/21/2017   Procedure: CATARACT EXTRACTION PHACO AND INTRAOCULAR LENS PLACEMENT (IOC);  Surgeon: Birder Robson, MD;  Location: [facility] ORS;  Service: Ophthalmology;  Laterality: Right;  [place] 00:30.9 AP% 12.7 CDE 3.94 Fluid Pack Lot # [id number]   CATARACT EXTRACTION W/PHACO Left 07/12/2017   Procedure: CATARACT EXTRACTION PHACO AND INTRAOCULAR LENS PLACEMENT (IOC);  Surgeon: Birder Robson, MD;  Location: [facility] ORS;  Service: Ophthalmology;  Laterality: Left;  [place]  00:42 AP% 15.2 CDE 6.38 Fluid pack lot # [id number]   COLONOSCOPY WITH PROPOFOL N/A 12/09/2020   Procedure: COLONOSCOPY WITH PROPOFOL;  Surgeon: Jonathon Bellows, MD;  Location: [facility] ENDOSCOPY;  Service: Gastroenterology;  Laterality: N/A;   DIAGNOSTIC LAPAROSCOPY     FRACTURE SURGERY Left    wrist   HERNIA REPAIR  4332   umbilical   JOINT REPLACEMENT Bilateral    KNEE CLOSED REDUCTION Left 12/30/2016   Procedure: CLOSED MANIPULATION KNEE;  Surgeon: Hessie Knows, MD;  Location: [facility] ORS;  Service: Orthopedics;  Laterality: Left;   LAPAROTOMY     TONSILLECTOMY     TOTAL KNEE ARTHROPLASTY Right 07/15/2016   Procedure: TOTAL KNEE ARTHROPLASTY;  Surgeon: Hessie Knows, MD;  Location: [facility] ORS;  Service: Orthopedics;  Laterality: Right;   TOTAL KNEE ARTHROPLASTY Left 10/26/2016   Procedure: TOTAL KNEE ARTHROPLASTY;  Surgeon: Hessie Knows, MD;  Location: [facility] ORS;  Service:  Orthopedics;  Laterality: Left;    Prior to Admission medications   Medication Sig Start Date End Date Taking? Authorizing Provider  amitriptyline (ELAVIL) 25 MG tablet Take 75 mg by mouth at bedtime.  05/25/16   [provider]  amLODipine (NORVASC) 10 MG tablet Take 10 mg by mouth at bedtime. 05/25/16   [provider]  aspirin EC 81 MG tablet Take 81 mg by mouth at bedtime.    [provider]  citalopram (CELEXA) 10 MG tablet Take 10 mg by mouth at bedtime. 04/02/16   [provider]  gabapentin (NEURONTIN) 300 MG capsule Take 900 mg by mouth 2 (two) times daily. Patient not taking: Reported on 10/27/2020 03/29/16   [provider]  hydrochlorothiazide (HYDRODIURIL) 25 MG tablet Take 25 mg by mouth at bedtime. 05/25/16   [provider]  mirtazapine (REMERON) 30 MG tablet Take 30 mg by mouth at bedtime. 03/30/16   [provider]  oxyCODONE-acetaminophen (PERCOCET/ROXICET) 5-325 MG tablet Take 1 tablet by mouth every 4 (four) hours as needed for severe pain. Patient not taking: Reported on 05/19/2020 04/12/18   Caryn Section,  Linden Dolin, PA-C  paroxetine mesylate (PEXEVA) 40 MG tablet Take 40 mg by mouth at bedtime.    [provider]  potassium chloride SA (K-DUR,KLOR-CON) 20 MEQ tablet Take 1 tablet (20 mEq total) by mouth once for 1 dose. 04/12/18 04/12/18  Caryn Section, Linden Dolin, PA-C  TRIUMEQ 600-50-300 MG tablet Take 1 tablet by mouth at bedtime. 04/28/16   [provider]    Allergies Penicillins  Family History  Problem Relation Age of Onset   Hypertension Father     Social History Social History   Tobacco Use   Smoking status: Every Day    Packs/day: 0.50    Years: 45.00    Pack years: 22.50    Types: Cigarettes   Smokeless tobacco: Never  Vaping Use   Vaping Use: Never used  Substance Use Topics   Alcohol use: No   Drug use: No    Review of Systems Constitutional: No fever/chills Eyes: No visual  changes. ENT: No sore throat. Cardiovascular: Denies chest pain. Respiratory: Denies shortness of breath. Gastrointestinal: No abdominal pain.  No nausea, no vomiting.  No diarrhea.   Genitourinary: Negative for dysuria. Musculoskeletal: Positive for left arm.  Skin: Negative for rash. Neurological: Negative for headaches, focal weakness or numbness.  ____________________________________________   PHYSICAL EXAM:  VITAL SIGNS: ED Triage Vitals [09/24/21 1550]  Enc Vitals Group     BP (!) 153/85     Pulse Rate (!) 107     Resp 18     Temp 98.1 F (36.7 C)     Temp Source Oral     SpO2 98 %     Weight 225 lb (102.1 kg)     Height 5\' 3"  (1.6 m)     Head Circumference      Peak Flow      Pain Score 4   Constitutional: Alert and oriented.  Eyes: Conjunctivae are normal.  ENT      Head: Normocephalic and atraumatic.      Nose: No congestion/rhinnorhea.      Mouth/Throat: Mucous membranes are moist.      Neck: No stridor. Hematological/Lymphatic/Immunilogical: No cervical lymphadenopathy. Cardiovascular: Normal rate, regular rhythm.  No murmurs, rubs, or gallops.  Respiratory: Normal respiratory effort without tachypnea nor retractions. Breath sounds are clear and equal bilaterally. No wheezes/rales/rhonchi. Gastrointestinal: Soft and non tender. No rebound. No guarding.  Genitourinary: Deferred Musculoskeletal: Normal range of motion in all extremities. No lower extremity edema. Left arm radial pulse 2+.  Neurologic:  Normal speech and language. No gross focal neurologic deficits are appreciated.  Skin:  Skin is warm, dry and intact. No rash noted. Psychiatric: Mood and affect are normal. Speech and behavior are normal. Patient exhibits appropriate insight and judgment.  ____________________________________________    LABS (pertinent positives/negatives)  Trop hs 5 CBC wbc 9.0, hgb 14.3, plt 371 BMP wnl  ____________________________________________   EKG  I,  Nance Pear, attending physician, personally viewed and interpreted this EKG  EKG Time: 1546 Rate: 109 Rhythm: sinus tachycardia Axis: left axis deviation Intervals: qtc 444 QRS: narrow, q waves v1, v2 ST changes: no st elevation Impression: abnormal ekg ____________________________________________    RADIOLOGY  CXR Mild opacities in left lower lung, atelectasis vs scarring  ____________________________________________   PROCEDURES  Procedures  ____________________________________________   INITIAL IMPRESSION / ASSESSMENT AND PLAN / ED COURSE  Pertinent labs & imaging results that were available during my care of the patient were reviewed by me and considered in my medical decision  making (see chart for details).  Patient presented to the emergency department today because of concerns for left arm pain.  She does state that this has been going on for months.  Patient does have a history of heart disease and does use cocaine this did have concerns for possible cardiac etiology.  Troponin was negative x2.  EKG without any concerning arrhythmias.  Additionally I have low suspicion for PE at this time.  Doubt dissection given length of time she has had the symptoms.  I do wonder if patient could be suffering from cervical radiculopathy.  I discussed this with the patient.  Will plan on discharging to follow-up with primary care.  ____________________________________________   FINAL CLINICAL IMPRESSION(S) / ED DIAGNOSES  Final diagnoses:  Left arm pain     Note: This dictation was prepared with Dragon dictation. Any transcriptional errors that result from this process are unintentional     Nance Pear, MD 09/24/21 2314

## 2021-09-24 NOTE — ED Notes (Signed)
IV removed from left hand that was placed PTA by EMS.  Catheter intact, gauze placed and bleeding controlled at this time.

## 2021-09-24 NOTE — ED Triage Notes (Signed)
Pt to ED via [facility] from [facility]. Pt stating left arm pain, dizziness and SOB for 2 wks. Pt seen at PCP and was sent over for evaluation for MI. Pt stating pain has increased. Pt stating cocaine use last night. 20g LH.

## 2021-09-24 NOTE — ED Triage Notes (Signed)
Pt in via EMS from [place] with c/p CP and left arm pain for 2 weeks, 12-lead negative at facility. Pt was given 1 nitro spray and 324mg  of asa. Pt admits to using cocaine last pm. Pt with #20g to left hand. 158/95, 96% RA, HR 109

## 2021-10-28 ENCOUNTER — Other Ambulatory Visit: Payer: Self-pay

## 2021-10-28 ENCOUNTER — Ambulatory Visit: Payer: Medicare HMO | Admitting: Gastroenterology

## 2021-10-28 ENCOUNTER — Encounter: Payer: Self-pay | Admitting: Gastroenterology

## 2021-11-29 DIAGNOSIS — R45851 Suicidal ideations: Secondary | ICD-10-CM

## 2021-11-29 HISTORY — DX: Suicidal ideations: R45.851

## 2022-01-01 ENCOUNTER — Other Ambulatory Visit: Payer: Self-pay | Admitting: Nurse Practitioner

## 2022-01-01 DIAGNOSIS — R52 Pain, unspecified: Secondary | ICD-10-CM

## 2022-02-28 ENCOUNTER — Other Ambulatory Visit: Payer: Self-pay

## 2022-02-28 ENCOUNTER — Inpatient Hospital Stay
Admission: EM | Admit: 2022-02-28 | Discharge: 2022-03-02 | DRG: 918 | Disposition: A | Discharge status: Other Acute Inpatient Hospital | Payer: Medicare HMO | Attending: Internal Medicine | Admitting: Internal Medicine

## 2022-02-28 ENCOUNTER — Observation Stay: Payer: Medicare HMO

## 2022-02-28 DIAGNOSIS — B20 Human immunodeficiency virus [HIV] disease: Secondary | ICD-10-CM | POA: Diagnosis present

## 2022-02-28 DIAGNOSIS — T600X2A Toxic effect of organophosphate and carbamate insecticides, intentional self-harm, initial encounter: Principal | ICD-10-CM | POA: Diagnosis present

## 2022-02-28 DIAGNOSIS — R1310 Dysphagia, unspecified: Secondary | ICD-10-CM | POA: Diagnosis present

## 2022-02-28 DIAGNOSIS — F1491 Cocaine use, unspecified, in remission: Secondary | ICD-10-CM | POA: Diagnosis present

## 2022-02-28 DIAGNOSIS — B182 Chronic viral hepatitis C: Secondary | ICD-10-CM | POA: Diagnosis present

## 2022-02-28 DIAGNOSIS — E876 Hypokalemia: Secondary | ICD-10-CM | POA: Diagnosis present

## 2022-02-28 DIAGNOSIS — F17210 Nicotine dependence, cigarettes, uncomplicated: Secondary | ICD-10-CM | POA: Diagnosis present

## 2022-02-28 DIAGNOSIS — K219 Gastro-esophageal reflux disease without esophagitis: Secondary | ICD-10-CM | POA: Diagnosis present

## 2022-02-28 DIAGNOSIS — I10 Essential (primary) hypertension: Secondary | ICD-10-CM | POA: Diagnosis present

## 2022-02-28 DIAGNOSIS — Z90710 Acquired absence of both cervix and uterus: Secondary | ICD-10-CM

## 2022-02-28 DIAGNOSIS — T50901A Poisoning by unspecified drugs, medicaments and biological substances, accidental (unintentional), initial encounter: Secondary | ICD-10-CM | POA: Diagnosis present

## 2022-02-28 DIAGNOSIS — F1920 Other psychoactive substance dependence, uncomplicated: Secondary | ICD-10-CM | POA: Diagnosis present

## 2022-02-28 DIAGNOSIS — G894 Chronic pain syndrome: Secondary | ICD-10-CM | POA: Diagnosis present

## 2022-02-28 DIAGNOSIS — Z79899 Other long term (current) drug therapy: Secondary | ICD-10-CM

## 2022-02-28 DIAGNOSIS — Z21 Asymptomatic human immunodeficiency virus [HIV] infection status: Secondary | ICD-10-CM | POA: Diagnosis present

## 2022-02-28 DIAGNOSIS — F32A Depression, unspecified: Secondary | ICD-10-CM | POA: Diagnosis present

## 2022-02-28 DIAGNOSIS — F419 Anxiety disorder, unspecified: Secondary | ICD-10-CM | POA: Diagnosis present

## 2022-02-28 DIAGNOSIS — I4430 Unspecified atrioventricular block: Secondary | ICD-10-CM | POA: Diagnosis present

## 2022-02-28 DIAGNOSIS — Z6836 Body mass index (BMI) 36.0-36.9, adult: Secondary | ICD-10-CM | POA: Diagnosis not present

## 2022-02-28 DIAGNOSIS — Z9841 Cataract extraction status, right eye: Secondary | ICD-10-CM

## 2022-02-28 DIAGNOSIS — T50902A Poisoning by unspecified drugs, medicaments and biological substances, intentional self-harm, initial encounter: Secondary | ICD-10-CM | POA: Diagnosis not present

## 2022-02-28 DIAGNOSIS — M5441 Lumbago with sciatica, right side: Secondary | ICD-10-CM | POA: Diagnosis present

## 2022-02-28 DIAGNOSIS — M48061 Spinal stenosis, lumbar region without neurogenic claudication: Secondary | ICD-10-CM | POA: Diagnosis present

## 2022-02-28 DIAGNOSIS — T6092XA Toxic effect of unspecified pesticide, intentional self-harm, initial encounter: Secondary | ICD-10-CM | POA: Diagnosis present

## 2022-02-28 DIAGNOSIS — T1491XA Suicide attempt, initial encounter: Secondary | ICD-10-CM | POA: Diagnosis present

## 2022-02-28 DIAGNOSIS — I2510 Atherosclerotic heart disease of native coronary artery without angina pectoris: Secondary | ICD-10-CM | POA: Diagnosis present

## 2022-02-28 DIAGNOSIS — Z96653 Presence of artificial knee joint, bilateral: Secondary | ICD-10-CM | POA: Diagnosis present

## 2022-02-28 DIAGNOSIS — E119 Type 2 diabetes mellitus without complications: Secondary | ICD-10-CM | POA: Diagnosis present

## 2022-02-28 DIAGNOSIS — Z7982 Long term (current) use of aspirin: Secondary | ICD-10-CM

## 2022-02-28 DIAGNOSIS — Z20822 Contact with and (suspected) exposure to covid-19: Secondary | ICD-10-CM | POA: Diagnosis present

## 2022-02-28 DIAGNOSIS — E669 Obesity, unspecified: Secondary | ICD-10-CM | POA: Diagnosis present

## 2022-02-28 DIAGNOSIS — Z8249 Family history of ischemic heart disease and other diseases of the circulatory system: Secondary | ICD-10-CM

## 2022-02-28 DIAGNOSIS — I252 Old myocardial infarction: Secondary | ICD-10-CM

## 2022-02-28 DIAGNOSIS — F1410 Cocaine abuse, uncomplicated: Secondary | ICD-10-CM | POA: Diagnosis present

## 2022-02-28 DIAGNOSIS — Z8673 Personal history of transient ischemic attack (TIA), and cerebral infarction without residual deficits: Secondary | ICD-10-CM

## 2022-02-28 DIAGNOSIS — Z9842 Cataract extraction status, left eye: Secondary | ICD-10-CM

## 2022-02-28 DIAGNOSIS — R45851 Suicidal ideations: Principal | ICD-10-CM

## 2022-02-28 DIAGNOSIS — M5442 Lumbago with sciatica, left side: Secondary | ICD-10-CM | POA: Diagnosis present

## 2022-02-28 DIAGNOSIS — F17200 Nicotine dependence, unspecified, uncomplicated: Secondary | ICD-10-CM | POA: Diagnosis present

## 2022-02-28 DIAGNOSIS — Z880 Allergy status to penicillin: Secondary | ICD-10-CM

## 2022-02-28 LAB — CBC WITH DIFFERENTIAL/PLATELET
Abs Immature Granulocytes: 0.05 10*3/uL (ref 0.00–0.07)
Basophils Absolute: 0 10*3/uL (ref 0.0–0.1)
Basophils Relative: 0 %
Eosinophils Absolute: 0 10*3/uL (ref 0.0–0.5)
Eosinophils Relative: 0 %
HCT: 44.9 % (ref 36.0–46.0)
Hemoglobin: 14.7 g/dL (ref 12.0–15.0)
Immature Granulocytes: 0 %
Lymphocytes Relative: 13 %
Lymphs Abs: 1.5 10*3/uL (ref 0.7–4.0)
MCH: 30.4 pg (ref 26.0–34.0)
MCHC: 32.7 g/dL (ref 30.0–36.0)
MCV: 93 fL (ref 80.0–100.0)
Monocytes Absolute: 0.7 10*3/uL (ref 0.1–1.0)
Monocytes Relative: 6 %
Neutro Abs: 9.1 10*3/uL — ABNORMAL HIGH (ref 1.7–7.7)
Neutrophils Relative %: 81 %
Platelets: 323 10*3/uL (ref 150–400)
RBC: 4.83 MIL/uL (ref 3.87–5.11)
RDW: 13.9 % (ref 11.5–15.5)
WBC: 11.3 10*3/uL — ABNORMAL HIGH (ref 4.0–10.5)
nRBC: 0 % (ref 0.0–0.2)

## 2022-02-28 LAB — COMPREHENSIVE METABOLIC PANEL
ALT: 15 U/L (ref 0–44)
AST: 23 U/L (ref 15–41)
Albumin: 4.2 g/dL (ref 3.5–5.0)
Alkaline Phosphatase: 123 U/L (ref 38–126)
Anion gap: 15 (ref 5–15)
BUN: 14 mg/dL (ref 8–23)
CO2: 26 mmol/L (ref 22–32)
Calcium: 9.7 mg/dL (ref 8.9–10.3)
Chloride: 97 mmol/L — ABNORMAL LOW (ref 98–111)
Creatinine, Ser: 0.85 mg/dL (ref 0.44–1.00)
GFR, Estimated: >60 mL/min (ref >60)
Glucose, Bld: 115 mg/dL — ABNORMAL HIGH (ref 70–99)
Potassium: 2.9 mmol/L — ABNORMAL LOW (ref 3.5–5.1)
Sodium: 138 mmol/L (ref 135–145)
Total Bilirubin: 0.6 mg/dL (ref 0.3–1.2)
Total Protein: 8.9 g/dL — ABNORMAL HIGH (ref 6.5–8.1)

## 2022-02-28 LAB — ACETAMINOPHEN LEVEL
Acetaminophen (Tylenol), Serum: <10 ug/mL — ABNORMAL LOW (ref 10–30)
Acetaminophen (Tylenol), Serum: <10 ug/mL — ABNORMAL LOW (ref 10–30)

## 2022-02-28 LAB — MAGNESIUM: Magnesium: 1.7 mg/dL (ref 1.7–2.4)

## 2022-02-28 LAB — ETHANOL: Alcohol, Ethyl (B): <10 mg/dL (ref <10)

## 2022-02-28 LAB — SALICYLATE LEVEL: Salicylate Lvl: <7 mg/dL — ABNORMAL LOW (ref 7.0–30.0)

## 2022-02-28 MED ORDER — NICOTINE 21 MG/24HR TD PT24
21.0000 mg | MEDICATED_PATCH | Freq: Every day | TRANSDERMAL | Status: DC
  Administered 2022-02-28 – 2022-03-02 (×3): 21 mg via TRANSDERMAL
  Filled 2022-02-28 (×3): qty 1

## 2022-02-28 MED ORDER — POTASSIUM CHLORIDE 10 MEQ/100ML IV SOLN
10.0000 meq | INTRAVENOUS | Status: AC
  Administered 2022-02-28 (×2): 10 meq via INTRAVENOUS
  Filled 2022-02-28 (×2): qty 100

## 2022-02-28 MED ORDER — SODIUM CHLORIDE 0.9 % IV SOLN: INTRAVENOUS | Status: DC

## 2022-02-28 MED ORDER — ONDANSETRON HCL 4 MG/2ML IJ SOLN
4.0000 mg | Freq: Once | INTRAMUSCULAR | Status: AC
  Administered 2022-02-28: 4 mg via INTRAVENOUS
  Filled 2022-02-28: qty 2

## 2022-02-28 MED ORDER — LORAZEPAM 2 MG/ML IJ SOLN
2.0000 mg | INTRAMUSCULAR | Status: DC | PRN
  Administered 2022-02-28: 2 mg via INTRAVENOUS
  Filled 2022-02-28: qty 1

## 2022-02-28 MED ORDER — ONDANSETRON HCL 4 MG PO TABS: 4.0000 mg | ORAL_TABLET | Freq: Four times a day (QID) | ORAL | Status: DC | PRN

## 2022-02-28 MED ORDER — ONDANSETRON HCL 4 MG/2ML IJ SOLN: 4.0000 mg | Freq: Four times a day (QID) | INTRAMUSCULAR | Status: DC | PRN

## 2022-02-28 MED ORDER — POTASSIUM CHLORIDE IN NACL 40-0.9 MEQ/L-% IV SOLN
INTRAVENOUS | Status: DC
Start: 2022-02-28 — End: 2022-03-02
  Filled 2022-02-28 (×5): qty 1000

## 2022-02-28 MED ORDER — PANTOPRAZOLE SODIUM 40 MG IV SOLR
40.0000 mg | INTRAVENOUS | Status: DC
Start: 2022-02-28 — End: 2022-03-02
  Administered 2022-02-28 – 2022-03-01 (×2): 40 mg via INTRAVENOUS
  Filled 2022-02-28 (×2): qty 10

## 2022-02-28 MED ORDER — IOHEXOL 300 MG/ML  SOLN
100.0000 mL | Freq: Once | INTRAMUSCULAR | Status: AC | PRN
  Administered 2022-02-28: 100 mL via INTRAVENOUS

## 2022-02-28 MED ORDER — SODIUM CHLORIDE 0.9 % IV BOLUS
1000.0000 mL | Freq: Once | INTRAVENOUS | Status: AC
  Administered 2022-02-28: 1000 mL via INTRAVENOUS

## 2022-02-28 MED ORDER — MAGNESIUM SULFATE 2 GM/50ML IV SOLN
2.0000 g | Freq: Once | INTRAVENOUS | Status: AC
Start: 2022-02-28 — End: 2022-03-01
  Administered 2022-02-28: 2 g via INTRAVENOUS
  Filled 2022-02-28: qty 50

## 2022-02-28 NOTE — Assessment & Plan Note (Signed)
Patient admits to polysubstance abuse ?Patient has been counseled on the need to abstain from illicit drug use ?

## 2022-02-28 NOTE — Assessment & Plan Note (Addendum)
-   Patient presents to the ER for evaluation following an intentional drug overdose. She admits to taking a large spoonful of orthene fire ant killer and one cup of scrub 3 with oxy clean ?-She had odynophagia on admission which has improved some.  This morning she is having some right-sided abdominal pain but also endorsing feeling hungry and wishing to eat.  Denies any nausea or vomiting ?-She does have some increased salivation but denied any other symptoms consistent with organophosphate poisoning ?-GI following, appreciate assistance ?- Okay to start clear liquid diet this morning and observe tolerance; discussed with GI ?

## 2022-02-28 NOTE — Consult Note (Signed)
? ? ?[facility] GI Inpatient Consult Note ? ? ?Kathline Magic, M.D. ? ?Reason for Consult: Caustic ingestion of material, odynophagia ?  ?Attending Requesting Consult: Collier Bullock, M.D. ? ?History of Present Illness: ?Lisa Hawkins is a 71 y.o. female with history of substance abuse, HIV, chronic hepatitis C treated with Harvoni (possibly in sustained remission), obesity, myocardial infarction and GERD presenting to the emergency room earlier today 1 hour after a suicide attempt via ingesting a combination of 1 cup of Ajax bleach, few tablespoons of Oxy clean bathroom cleaner headlined as "scrub free", as well as approximately 1 teaspoon of Orthene fire ant killer,(an organophosphate cholinesterase inhibitor). ?The patient is tearful and very anxious.  She is accompanied by her domestic partner with whom she lives, Lisa Hawkins.  Lisa Hawkins gives most of the history due to patient's agitated and anxious state.  Patient describes mild diffuse abdominal discomfort.  She was able to tolerate 1 glass of water about an hour ago given to her by the nurse.  No vomiting.  She does complain of a sore throat related to her caustic ingestion and some mild difficulty swallowing although this has not resulted in drooling or lack of holding down oral secretions.  Chest x-ray obtained in the emergency room revealed no active disease.  CT to grade the extent of inflammation of the upper GI tract and intestinal tract has not yet been ordered. ? ?Past Medical History:  ?Past Medical History:  ?Diagnosis Date  ? Anxiety   ? Arthritis   ? Bronchitis   ? Chronic bilateral low back pain with bilateral sciatica 05/19/2020  ? Complication of anesthesia   ? vomitting  ? Dysrhythmia   ? hx of flutter, no longer present  ? Elevated lipids   ? GERD (gastroesophageal reflux disease)   ? does not take meds but has gerd pretty bad  ? Hepatitis   ? Hep C - treated with Harvoni  ? HIV (human immunodeficiency virus infection) ([place])   ? HOH  (hard of hearing)   ? Hypertension   ? Myocardial infarction [facility]) 1993  ? Neuropathy   ? Seizures ([place])   ? not considered seizures but does "black out" and unsure of what happened. happens when she overheats. last episode 4 days ago.  ? Stroke [facility])   ? no defecits  ?  ?Problem List: ?Patient Active Problem List  ? Diagnosis Date Noted  ? Drug overdose, intentional ([place]) 02/28/2022  ? Suicide attempt ([place]) 02/28/2022  ? Hypokalemia 02/28/2022  ? Hypomagnesemia 02/28/2022  ? Class 3 severe obesity with serious comorbidity and body mass index (BMI) of 45.0 to 49.9 in adult [facility]) 06/08/2020  ? Elevated sed rate 06/08/2020  ? Elevated C-reactive protein (CRP) 06/08/2020  ? Abnormal MRI, lumbar spine (02/04/2020) 05/19/2020  ? Kidney lesion (Left) 05/19/2020  ? Osteoarthritis of knee (Bilateral) 05/19/2020  ? Lumbar Grade 1 Anterolisthesis of L4/L5 05/19/2020  ? Lumbar facet arthropathy (Multilevel) (Bilateral) 05/19/2020  ? Lumbar foraminal stenosis (Multilevel) (Bilateral) 05/19/2020  ? Lumbar lateral recess stenosis (L3-4) (Right) 05/19/2020  ? Lumbar central spinal stenosis w/o neurogenic claudication (L4-5) 05/19/2020  ? DDD (degenerative disc disease), lumbosacral 05/19/2020  ? Lumbosacral IVDD (intervertebral disc displacement) 05/19/2020  ? History of cocaine use 05/19/2020  ? Chronic low back pain (1ry area of Pain) (Bilateral) (R>L) w/ sciatica (Bilateral) 05/19/2020  ? Chronic lower extremity pain (2ry area of Pain) (Bilateral) (R>L) 05/19/2020  ? Chronic knee pain after total knee replacement (Bilateral) (L>R) 05/19/2020  ?  Chronic hip pain (3ry area of Pain) (Bilateral) (R>L) 05/19/2020  ? Chronic sacroiliac joint pain (Bilateral) (R>L) 05/19/2020  ? Lumbar facet syndrome (Bilateral) (R>L) 05/19/2020  ? Chronic pain syndrome 05/18/2020  ? Pharmacologic therapy 05/18/2020  ? Disorder of skeletal system 05/18/2020  ? Problems influencing health status 05/18/2020  ? Trigger ring finger of left hand 01/25/2017   ? Altered mental status 10/28/2016  ? Wheezing 10/28/2016  ? Dyspnea 10/28/2016  ? Acute respiratory failure with hypoxia ([place]) 10/28/2016  ? Hyponatremia 10/28/2016  ? Hepatomegaly 10/28/2016  ? Osteoarthritis of knee (Left) 10/26/2016  ? Osteoarthritis of knee (Right) 07/15/2016  ? Abnormal ECG 05/17/2016  ? Chronic knee pain (Bilateral) 05/17/2016  ? Exertional shortness of breath 03/30/2016  ? Compensated cirrhosis related to hepatitis C virus (HCV) ([place]) 12/22/2015  ? Kidney disease 06/18/2014  ? Tinea 03/07/2014  ? Routine adult health maintenance 11/26/2013  ? Tobacco use disorder 09/30/2013  ? Dizziness 09/28/2013  ? Hypertension 09/28/2013  ? Obesity 09/28/2013  ? Onychomycosis 09/28/2013  ? Osteoarthritis, knee (Left) 09/28/2013  ? Calcaneal spur 02/26/2013  ? Drug addiction syndrome ([place]) 10/05/2011  ? Displacement of lumbar intervertebral disc 07/11/2006  ? Cerebral artery occlusion with cerebral infarction ([place]) 01/11/2006  ? PPD positive 02/08/2004  ? Depressive disorder 08/11/1999  ? HIV (human immunodeficiency virus infection) ([place]) 05/06/1999  ?  ?Past Surgical History: ?Past Surgical History:  ?Procedure Laterality Date  ? ABDOMINAL HYSTERECTOMY  1970  ? BACK SURGERY    ? no metal  ? CATARACT EXTRACTION W/PHACO Right 06/21/2017  ? Procedure: CATARACT EXTRACTION PHACO AND INTRAOCULAR LENS PLACEMENT (IOC);  Surgeon: Birder Robson, MD;  Location: [facility] ORS;  Service: Ophthalmology;  Laterality: Right;  [place] 00:30.9 ?AP% 12.7 ?CDE 3.94 ?Fluid Pack Lot # [id number]  ? CATARACT EXTRACTION W/PHACO Left 07/12/2017  ? Procedure: CATARACT EXTRACTION PHACO AND INTRAOCULAR LENS PLACEMENT (IOC);  Surgeon: Birder Robson, MD;  Location: [facility] ORS;  Service: Ophthalmology;  Laterality: Left;  [place]  00:42 ?AP% 15.2 ?CDE 6.38 ?Fluid pack lot # [id number]  ? COLONOSCOPY WITH PROPOFOL N/A 12/09/2020  ? Procedure: COLONOSCOPY WITH PROPOFOL;  Surgeon: Jonathon Bellows, MD;  Location[facility] ENDOSCOPY;  Service: Gastroenterology;   Laterality: N/A;  ? DIAGNOSTIC LAPAROSCOPY    ? FRACTURE SURGERY Left   ? wrist  ? HERNIA REPAIR  [id number]  ? umbilical  ? JOINT REPLACEMENT Bilateral   ? KNEE CLOSED REDUCTION Left 12/30/2016  ? Procedure: CLOSED MANIPULATION KNEE;  Surgeon: Hessie Knows, MD;  Location: [facility] ORS;  Service: Orthopedics;  Laterality: Left;  ? LAPAROTOMY    ? TONSILLECTOMY    ? TOTAL KNEE ARTHROPLASTY Right 07/15/2016  ? Procedure: TOTAL KNEE ARTHROPLASTY;  Surgeon: Hessie Knows, MD;  Location: [facility] ORS;  Service: Orthopedics;  Laterality: Right;  ? TOTAL KNEE ARTHROPLASTY Left 10/26/2016  ? Procedure: TOTAL KNEE ARTHROPLASTY;  Surgeon: Hessie Knows, MD;  Location: [facility] ORS;  Service: Orthopedics;  Laterality: Left;  ?  ?Allergies: ?Allergies  ?Allergen Reactions  ? Penicillins Swelling and Rash  ?  Has patient had a PCN reaction causing immediate rash, facial/tongue/throat swelling, SOB or lightheadedness with hypotension: Yes ?Has patient had a PCN reaction causing severe rash involving mucus membranes or skin necrosis: No ?Has patient had a PCN reaction that required hospitalization Yes ?Has patient had a PCN reaction occurring within the last 10 years: No ?If all of the above answers are "NO", then may proceed with Cephalosporin use. ?  ?  ?Home Medications: ?(Not in a  hospital admission) ? ?Home medication reconciliation was completed with the patient.  ? ?Scheduled Inpatient Medications: ?  ? nicotine  21 mg Transdermal Daily  ? pantoprazole (PROTONIX) IV  40 mg Intravenous Q24H  ? ? ?Continuous Inpatient Infusions: ?  ? 0.9 % NaCl with KCl 40 mEq / L    ? magnesium sulfate bolus IVPB    ? ? ?PRN Inpatient Medications:  ?iohexol, ondansetron **OR** ondansetron (ZOFRAN) IV ? ?Family History: ?family history includes Hypertension in her father.  ? ?GI Family History: Negative. ? ?Social History:  ? reports that she has been smoking cigarettes. She has a 22.50 pack-year smoking history. She has never used smokeless tobacco. She reports  that she does not drink alcohol and does not use drugs. The patient denies ETOH, tobacco, or drug use.  ? ? ?Review of Systems: Review of Systems - Negative except HPI ? ?Physical Examination: ?BP (!) 112

## 2022-02-28 NOTE — H&P (Signed)
?History and Physical  ? ? ?Patient: SADAF PRZYBYSZ [id number]:[id number] DOB: 1951-07-03 ?DOA: 02/28/2022 ?DOS: the patient was seen and examined on 02/28/2022 ?PCP: Donnie Coffin, MD  ?Patient coming from: Home ? ?Chief Complaint:  ?Chief Complaint  ?Patient presents with  ? Poisoning  ? ?HPI: ALAIJAH GIBLER is a 71 y.o. female with medical history significant for obesity, depression, anxiety, substance abuse, hypertension, coronary artery disease, hepatitis C treated with Harvoni who was brought into the ER for evaluation after she told him she had taken one cup of scrubs free with Oxy clean bathroom cleaner and a large spoonful of orthene fire ant killer.  Patient states she took these items because she wanted to kill herself.  She denies having any homicidal ideations. ?She complains of burning pain in the epigastrium and periumbilical area as well as pain with swallowing but denies having any nausea or vomiting.  ?She denies having any chest pain, no shortness of breath, no dizziness, no lightheadedness, no urinary frequency, no changes in her bowel habits, no headache, no blurred vision, no increased lacrimation, no increased salivation or focal deficit. ?Poison control was contacted by the emergency room staff and they advised that patient be monitored for a minimum of 8 hours.  She needs to be monitored closely for symptoms of SLUDGE/DUMBELS (symptoms related to organophosphate poisoning) ?Review of Systems: As mentioned in the history of present illness. All other systems reviewed and are negative. ?Past Medical History:  ?Diagnosis Date  ? Anxiety   ? Arthritis   ? Bronchitis   ? Chronic bilateral low back pain with bilateral sciatica 05/19/2020  ? Complication of anesthesia   ? vomitting  ? Dysrhythmia   ? hx of flutter, no longer present  ? Elevated lipids   ? GERD (gastroesophageal reflux disease)   ? does not take meds but has gerd pretty bad  ? Hepatitis   ? Hep C - treated with Harvoni  ? HIV (human  immunodeficiency virus infection) ([place])   ? HOH (hard of hearing)   ? Hypertension   ? Myocardial infarction [facility] [place]) 1993  ? Neuropathy   ? Seizures ([place])   ? not considered seizures but does "black out" and unsure of what happened. happens when she overheats. last episode 4 days ago.  ? Stroke [facility])   ? no defecits  ? ?Past Surgical History:  ?Procedure Laterality Date  ? ABDOMINAL HYSTERECTOMY  1970  ? BACK SURGERY    ? no metal  ? CATARACT EXTRACTION W/PHACO Right 06/21/2017  ? Procedure: CATARACT EXTRACTION PHACO AND INTRAOCULAR LENS PLACEMENT (IOC);  Surgeon: Birder Robson, MD;  Location: [facility] ORS;  Service: Ophthalmology;  Laterality: Right;  [place] 00:30.9 ?AP% 12.7 ?CDE 3.94 ?Fluid Pack Lot # [id number]  ? CATARACT EXTRACTION W/PHACO Left 07/12/2017  ? Procedure: CATARACT EXTRACTION PHACO AND INTRAOCULAR LENS PLACEMENT (IOC);  Surgeon: Birder Robson, MD;  Location: [facility] ORS;  Service: Ophthalmology;  Laterality: Left;  [place]  00:42 ?AP% 15.2 ?CDE 6.38 ?Fluid pack lot # [id number]  ? COLONOSCOPY WITH PROPOFOL N/A 12/09/2020  ? Procedure: COLONOSCOPY WITH PROPOFOL;  Surgeon: Jonathon Bellows, MD;  Location: [facility] ENDOSCOPY;  Service: Gastroenterology;  Laterality: N/A;  ? DIAGNOSTIC LAPAROSCOPY    ? FRACTURE SURGERY Left   ? wrist  ? HERNIA REPAIR  [id number]  ? umbilical  ? JOINT REPLACEMENT Bilateral   ? KNEE CLOSED REDUCTION Left 12/30/2016  ? Procedure: CLOSED MANIPULATION KNEE;  Surgeon: Hessie Knows, MD;  Location: [facility] ORS;  Service:  Orthopedics;  Laterality: Left;  ? LAPAROTOMY    ? TONSILLECTOMY    ? TOTAL KNEE ARTHROPLASTY Right 07/15/2016  ? Procedure: TOTAL KNEE ARTHROPLASTY;  Surgeon: Hessie Knows, MD;  Location: [facility] ORS;  Service: Orthopedics;  Laterality: Right;  ? TOTAL KNEE ARTHROPLASTY Left 10/26/2016  ? Procedure: TOTAL KNEE ARTHROPLASTY;  Surgeon: Hessie Knows, MD;  Location: [facility] ORS;  Service: Orthopedics;  Laterality: Left;  ? ?Social History:  reports that she has been smoking cigarettes. She has a 22.50  pack-year smoking history. She has never used smokeless tobacco. She reports that she does not drink alcohol and does not use drugs. ? ?Allergies  ?Allergen Reactions  ? Penicillins Swelling and Rash  ?  Has patient had a PCN reaction causing immediate rash, facial/tongue/throat swelling, SOB or lightheadedness with hypotension: Yes ?Has patient had a PCN reaction causing severe rash involving mucus membranes or skin necrosis: No ?Has patient had a PCN reaction that required hospitalization Yes ?Has patient had a PCN reaction occurring within the last 10 years: No ?If all of the above answers are "NO", then may proceed with Cephalosporin use. ?  ? ? ?Family History  ?Problem Relation Age of Onset  ? Hypertension Father   ? ? ?Prior to Admission medications   ?Medication Sig Start Date End Date Taking? Authorizing Provider  ?amitriptyline (ELAVIL) 100 MG tablet Take 100 mg by mouth at bedtime. 09/04/21  Yes [provider]  ?Aspirin 81 MG CAPS Take 1 capsule by mouth daily. 02/02/18  Yes [provider]  ?atorvastatin (LIPITOR) 40 MG tablet Take 40 mg by mouth at bedtime. 06/30/21  Yes [provider]  ?famotidine (PEPCID) 40 MG tablet Take 1 tablet by mouth as needed. 06/01/19  Yes [provider]  ?hydrochlorothiazide (HYDRODIURIL) 25 MG tablet Take 25 mg by mouth at bedtime. 05/25/16  Yes [provider]  ?naproxen (NAPROSYN) 500 MG tablet Take 500 mg by mouth 2 (two) times daily. 08/06/21  Yes [provider]  ?omeprazole (PRILOSEC) 20 MG capsule Take 20 mg by mouth daily. 09/30/21  Yes [provider]  ?oxyCODONE-acetaminophen (PERCOCET/ROXICET) 5-325 MG tablet Take 1 tablet by mouth every 4 (four) hours as needed for severe pain. 04/12/18  Yes Versie Starks, PA-C  ?PARoxetine (PAXIL) 40 MG tablet  05/29/19  Yes [provider]  ?amLODipine (NORVASC) 10 MG tablet Take 1 tablet by mouth daily. ?Patient not taking: Reported on 02/28/2022 05/29/19    [provider]  ?Blood Pressure Monitoring (ADULT BLOOD PRESSURE CUFF LG) KIT use daily as needed, Dc code I10 07/21/21   [provider]  ?CALCIUM 600+D 600-10 MG-MCG TABS Take 1 tablet by mouth 2 (two) times daily. ?Patient not taking: Reported on 02/28/2022 09/30/21   [provider]  ?chlorhexidine (PERIDEX) 0.12 % solution RINSE WITH 15 MLS FOR 30 SECONDS IN EVERY MORNING AND EVERY EVENING AFTER TOOTHBRUSHING THEN SPIT AFTER RINSING. DO NOT SWALLOW ?Patient not taking: Reported on 02/28/2022 12/10/19   [provider]  ?gabapentin (NEURONTIN) 400 MG capsule Take 1 capsule by mouth 1 day or 1 dose. ?Patient not taking: Reported on 02/28/2022 05/29/19   [provider]  ?NICODERM CQ 21 MG/24HR patch 21 mg daily. 09/24/21   [provider]  ?OZEMPIC, 1 MG/DOSE, 4 MG/3ML SOPN Inject into the skin. ?Patient not taking: Reported on 02/28/2022 09/07/21   [provider]  ?paroxetine mesylate (PEXEVA) 40 MG tablet Take 40 mg by mouth at bedtime.    [provider]  ?potassium chloride SA (K-DUR,KLOR-CON) 20 MEQ tablet Take 1 tablet (20 mEq total) by mouth once for 1 dose. ?Patient not taking: Reported on 02/28/2022 04/12/18 04/12/18  Caryn Section Linden Dolin, PA-C  ?TRIUMEQ 600-50-300 MG tablet Take 1 tablet by mouth at bedtime. ?Patient not taking: Reported on 02/28/2022 04/28/16   [provider]  ? ? ?Physical Exam: ?Vitals:  ? 02/28/22 1232 02/28/22 1306 02/28/22 1342 02/28/22 1400  ?BP: (!) 114/43 129/77 107/84 (!) 112/98  ?Pulse: (!) 104 (!) 102 (!) 101 96  ?Resp: 15 19 (!) 24 19  ?Temp:      ?TempSrc:      ?SpO2: 97% 100% 97% 100%  ?Weight:      ?Height:      ? ?Physical Exam ?Vitals and nursing note reviewed.  ?Constitutional:   ?   Appearance: She is obese.  ?HENT:  ?   Head: Normocephalic and atraumatic.  ?   Nose: Nose normal.  ?   Mouth/Throat:  ?   Mouth: Mucous membranes are dry.  ?Eyes:  ?   Conjunctiva/sclera: Conjunctivae normal.  ?Cardiovascular:   ?   Rate and Rhythm: Regular rhythm.  ?Pulmonary:  ?   Effort: Pulmonary effort is normal.  ?   Breath sounds: Normal breath sounds.  ?Abdominal:  ?   General: Bowel sounds are normal.  ?   Palpations: Abdomen is soft.

## 2022-02-28 NOTE — ED Notes (Signed)
RN to bedside to assess pt. Tech called and advised pt had seizure like activity. Pt is CAOx4 at this time in no acute distress. Cold rag given to patient at her request.  ?

## 2022-02-28 NOTE — Consult Note (Signed)
This provider went to assess the client with her family at the bedside.  The family requested to see her when she is more stable as "she just had a seizure and does not remember why she's here."  Psych will continue to follow and assess when she is clear and coherent after poison control has cleared her.   ? ?Waylan Boga, PMHNP ?

## 2022-02-28 NOTE — ED Notes (Signed)
PO challenge. Pt kept down 8 oz of water ?

## 2022-02-28 NOTE — Assessment & Plan Note (Addendum)
Patient not on HAART ?-Check HIV ?

## 2022-02-28 NOTE — Assessment & Plan Note (Signed)
-   Supplement magnesium 

## 2022-02-28 NOTE — Progress Notes (Signed)
Patient with persistent small volume emesis, mostly spit but some bile. Also with loose stools x 2. Poison control contacted for further recommendations. Per poison control, will continue to watch for blood in emesis or stool. Will also offer ice chips for discomfort in throat. MD made aware and orders changed to allow ice chips ?

## 2022-02-28 NOTE — Assessment & Plan Note (Signed)
Most likely related to hydrochlorothiazide use ?Supplement potassium ?

## 2022-02-28 NOTE — ED Provider Notes (Signed)
? ?[facility] ?Provider Note ? ? ? Event Date/Time  ? First MD Initiated Contact with Patient 02/28/22 1127   ?  (approximate) ? ? ?History  ? ?Poisoning ? ? ?HPI ? ?Lisa Hawkins is a 71 y.o. female with history of diabetes , depression who comes in for concern for overdose.  Patient reports that she was having SI and drink 1 cup of scrub 3 with Oxy clean and a large spoonful of Orthene fire ant killer and 1 cup of Mr clean all about 1 hour prior to arrival.  She reports that she took them with an attempt to try to kill herself.  She reports a little bit of nausea, vomiting and abdominal discomfort after the ingestion. ? ? ?Physical Exam  ? ?Triage Vital Signs: ?Blood pressure (!) 135/98, pulse (!) 107, temperature 98.2 ?F (36.8 ?C), temperature source Oral, resp. rate 16, height '5\' 3"'$  (1.6 m), weight 93 kg, SpO2 98 %. ? ? ?Most recent vital signs: ?Vitals:  ? 02/28/22 1131 02/28/22 1135  ?BP: (!) 135/98   ?Pulse: (!) 107   ?Resp: 16   ?Temp:  98.2 ?F (36.8 ?C)  ?SpO2: 98%   ? ? ? ?General: Awake, no distress.  ?CV:  Good peripheral perfusion.  ?Resp:  Normal effort.  ?Abd:  No distention.  Soft non tender  ?Other:  + SI  ? ? ?ED Results / Procedures / Treatments  ? ?Labs ?(all labs ordered are listed, but only abnormal results are displayed) ?Labs Reviewed  ?CBC WITH DIFFERENTIAL/PLATELET - Abnormal; Notable for the following components:  ?    Result Value  ? WBC 11.3 (*)   ? Neutro Abs 9.1 (*)   ? All other components within normal limits  ?COMPREHENSIVE METABOLIC PANEL - Abnormal; Notable for the following components:  ? Potassium 2.9 (*)   ? Chloride 97 (*)   ? Glucose, Bld 115 (*)   ? Total Protein 8.9 (*)   ? All other components within normal limits  ?SALICYLATE LEVEL - Abnormal; Notable for the following components:  ? Salicylate Lvl <4.6 (*)   ? All other components within normal limits  ?ACETAMINOPHEN LEVEL - Abnormal; Notable for the following components:  ? Acetaminophen  (Tylenol), Serum <10 (*)   ? All other components within normal limits  ?MAGNESIUM  ?URINE DRUG SCREEN, QUALITATIVE ([facility] ONLY)  ?ETHANOL  ?ACETAMINOPHEN LEVEL  ? ? ? ?EKG ? ?My interpretation of EKG: ? ?Normal sinus rate 97 without any ST elevation or T wave inversions, type I AV block with some left anterior fascicular block ? ?RADIOLOGY ?I have reviewed the xray personally  and negative for pneumonia  ? ? ?PROCEDURES: ? ?Critical Care performed: Yes, see critical care procedure note(s) ? ?.1-3 Lead EKG Interpretation ?Performed by: Vanessa [place], MD ?Authorized by: Vanessa [place], MD  ? ?  Interpretation: abnormal   ?  ECG rate:  100 ?  ECG rate assessment: tachycardic   ?  Rhythm: sinus tachycardia   ?  Ectopy: none   ?  Conduction: normal   ?.Critical Care ?Performed by: Vanessa Dubberly, MD ?Authorized by: Vanessa [place], MD  ? ?Critical care provider statement:  ?  Critical care time (minutes):  30 ?  Critical care was necessary to treat or prevent imminent or life-threatening deterioration of the following conditions:  [place]oxid[place] ?  Critical care was time spent personally by me on the following activities:  Development of treatment plan with patient  or surrogate, discussions with consultants, evaluation of patient's response to treatment, examination of patient, ordering and review of laboratory studies, ordering and review of radiographic studies, ordering and performing treatments and interventions, pulse oximetry, re-evaluation of patient's condition and review of old charts ? ? ?MEDICATIONS ORDERED IN ED: ?Medications  ?ondansetron (ZOFRAN) injection 4 mg (4 mg Intravenous Given 02/28/22 1220)  ?sodium chloride 0.9 % bolus 1,000 mL (1,000 mLs Intravenous Bolus 02/28/22 1219)  ?potassium chloride 10 mEq in 100 mL IVPB (0 mEq Intravenous Stopped 02/28/22 1330)  ? ? ? ?IMPRESSION / MDM / ASSESSMENT AND PLAN / ED COURSE  ?I reviewed the triage vital signs and the nursing notes. ? ? ?Patient comes in with overdose  attempt.  IVC was placed consult for psychiatry was ordered.  Patient is actively vomiting.  Patient given 1 L of fluid and some 4 of Zofran. ? ? ? Discussed with poison control who recommends 8 hours of observation due to concerns for everything that she took.  To monitor for organophosphate poisoning.  To attempt p.o. challenge after 2 hours and if not tolerated probably will not need endoscopy.  ? ? CBC easily elevated white count.  CMP low potassium given some IV.  Magnesium reassuring.  I did add on 4-hour Tylenol level ? ? ?  Given the prolonged observation.  I have discussed the case with the hospitalist. ? ?The patient is on the cardiac monitor to evaluate for evidence of arrhythmia and/or significant heart rate changes. ? ? ?FINAL CLINICAL IMPRESSION(S) / ED DIAGNOSES  ? ?Final diagnoses:  ?Suicide ideation  ?Intentional overdose, initial encounter [facility])  ? ? ? ?Rx / DC Orders  ? ?ED Discharge Orders   ? ? None  ? ?  ? ? ? ?Note:  This document was prepared using Dragon voice recognition software and may include unintentional dictation errors. ?  ?Vanessa [place], MD ?02/28/22 1439 ? ?

## 2022-02-28 NOTE — ED Notes (Signed)
This RN called poison control to open case. Poison control advised to monitor pt for minimum of 8 hours. Watch for SLUDGE/DUMBELLS symptoms. In 2 hours PO challenge pt with water. If pt can keep water down without GI upset good, if causes GI upset or vomiting consult GI for possible scope.  ?

## 2022-02-28 NOTE — Assessment & Plan Note (Signed)
Complicates overall prognosis and care ?Lifestyle modification and exercise has been discussed with patient in detail ?

## 2022-02-28 NOTE — ED Notes (Signed)
This tech observed pt shaking. The friend with the pt said she has seizures and he splashes water on her face to snap her out of it. This tech pushed the assist button on the call bell and asked the secretary to send in [employer] or any RN.  The pt's shaking lasted about 15 seconds and was symmetrical. RN Crystal, came into the room and assessed the pt. After this even pt said she couldn't remember where she was or why she was at the hospital. Pt has continued to have tremors and be emotionally upset since seizure event.  ?

## 2022-02-28 NOTE — Assessment & Plan Note (Addendum)
-   Patient has a history of depression and substance abuse and presents to the ER for evaluation following an intentional overdose. ?-Hospitalized 5 years ago for similar in rehab but began using again once she got out ?-Unfortunately home situation does not appear to be safe or a good influence to keep her sober ?-Landscape architect for now.  If tries to leave would need IVC ?- Psychiatry consulted, follow-up recommendations ?

## 2022-02-28 NOTE — Plan of Care (Signed)

## 2022-02-28 NOTE — ED Triage Notes (Signed)
Per pt and family pt ingested one cup of scrub free with oxiclean bathroom cleaner and large spoonful of orthene fire ant killer. 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 Pt states she has tried to kill herself before. MD in room.  ?

## 2022-02-28 NOTE — Assessment & Plan Note (Signed)
Smoking cessation has been discussed with patient in detail ?We will place patient on nicotine transdermal patch 21 mg daily ?

## 2022-03-01 DIAGNOSIS — F1920 Other psychoactive substance dependence, uncomplicated: Secondary | ICD-10-CM | POA: Diagnosis not present

## 2022-03-01 DIAGNOSIS — T50902A Poisoning by unspecified drugs, medicaments and biological substances, intentional self-harm, initial encounter: Secondary | ICD-10-CM

## 2022-03-01 DIAGNOSIS — T1491XA Suicide attempt, initial encounter: Secondary | ICD-10-CM

## 2022-03-01 LAB — BASIC METABOLIC PANEL
Anion gap: 10 (ref 5–15)
BUN: 10 mg/dL (ref 8–23)
CO2: 25 mmol/L (ref 22–32)
Calcium: 8.5 mg/dL — ABNORMAL LOW (ref 8.9–10.3)
Chloride: 104 mmol/L (ref 98–111)
Creatinine, Ser: 0.72 mg/dL (ref 0.44–1.00)
GFR, Estimated: >60 mL/min (ref >60)
Glucose, Bld: 102 mg/dL — ABNORMAL HIGH (ref 70–99)
Potassium: 3.2 mmol/L — ABNORMAL LOW (ref 3.5–5.1)
Sodium: 139 mmol/L (ref 135–145)

## 2022-03-01 LAB — CBC
HCT: 39.9 % (ref 36.0–46.0)
Hemoglobin: 13.3 g/dL (ref 12.0–15.0)
MCH: 31.1 pg (ref 26.0–34.0)
MCHC: 33.3 g/dL (ref 30.0–36.0)
MCV: 93.2 fL (ref 80.0–100.0)
Platelets: 270 10*3/uL (ref 150–400)
RBC: 4.28 MIL/uL (ref 3.87–5.11)
RDW: 14.1 % (ref 11.5–15.5)
WBC: 10.7 10*3/uL — ABNORMAL HIGH (ref 4.0–10.5)
nRBC: 0 % (ref 0.0–0.2)

## 2022-03-01 NOTE — Progress Notes (Signed)
Patient requesting food and water at this time. Patient reminded to ask her doctor when he rounds. Informed patient she may have ice chips but not a cup of water. ?

## 2022-03-01 NOTE — Hospital Course (Signed)
Ms. Leap is a 71 yo female with PMH substance abuse (cocaine), obesity, depression/anxiety, HTN, CAD, HCV s/p treatment with [facility] who was brought to the ER after intentional overdose of one cup of scrubs free with Oxy clean bathroom cleaner and a large spoonful of orthene fire ant killer (organophosphate).  ?She states that she was tired of living the life she had been living full of addiction to cocaine.  She has been unable to quit despite wanting to.  She says she was in rehab approximately 5 years ago and the day she got out she began using again.  Her significant other is also a substance abuser with cocaine.  She does not feel that she can remain sober at home however she has nowhere else to go. ? ?She underwent CT neck as well as chest/abdomen/pelvis on admission.  She was found to have soft tissue swelling in the supraglottic airway which was poorly evaluated.  Remainder of CT imaging did not show any other concern for mucosal damage. ?Poison control was made aware and she was recommended to be monitored for organophosphate poisoning for at least 8 hours. ?Psychiatry was also consulted on admission. ?

## 2022-03-01 NOTE — Progress Notes (Signed)
Spoke with Almyra Free at [facility] control who states she will close the case on their end and we can reach out if anything changes. ?

## 2022-03-01 NOTE — Consult Note (Signed)
[facility] Face-to-Face Psychiatry Consult  ? ?Reason for Consult:  depression ?Referring Physician:  Girguis ?Patient Identification: Lisa Hawkins ?MRN:  [id number] ?Principal Diagnosis: Drug overdose, intentional ([facility]) ?Diagnosis:  Principal Problem: ?  Drug overdose, intentional ([place]) ?Active Problems: ?  Depressive disorder ?  Drug addiction syndrome ([place]) ?  HIV (human immunodeficiency virus infection) ([place]) ?  Obesity ?  Tobacco use disorder ?  History of cocaine use ?  Suicide attempt [facility]) ?  Hypokalemia ?  Hypomagnesemia ?  Overdose ? ? ?Total Time spent with patient: 45 minutes ? ?Subjective:   ?Lisa Hawkins is a 71 y.o. female patient admitted with intentional ingestion of cleaning substances. ? ?HPI: Patient was seen in consultation on the medical floor.  She ingested cleaning substances in what is reported in the ED triage notes as a suicide attempt.  On evaluation today, patient is denying that this was a suicide attempt and states that she did it because "she was high."  She states that she has used illicit substances, mostly cocaine, starting in the early 1980s.  She states that she quit for a while, and then started using heroin when and crack cocaine in the 1990s. ?Patient states, "I am 71 years old, I do not need to be doing this silliness, using drugs," she states that she has not been in rehab before but is interested in stopping the use of all illicit substances.  She states that she is spending $900 a month on crack cocaine.  Patient reports that she was on Paxil for about a year but has recently stopped. Feels it may have been helpful.  She does report that sometimes she has auditory or visual hallucinations even when she is not using crack cocaine. ? ?Past Psychiatric History: Depressive disorder; intentional ingestion of cleaning substances; polysubstance abuse ? ?Risk to Self:   ?Risk to Others:   ?Prior Inpatient Therapy:   ?Prior Outpatient Therapy:   ? ?Past Medical History:  ?Past  Medical History:  ?Diagnosis Date  ? Anxiety   ? Arthritis   ? Bronchitis   ? Chronic bilateral low back pain with bilateral sciatica 05/19/2020  ? Complication of anesthesia   ? vomitting  ? Dysrhythmia   ? hx of flutter, no longer present  ? Elevated lipids   ? GERD (gastroesophageal reflux disease)   ? does not take meds but has gerd pretty bad  ? Hepatitis   ? Hep C - treated with [facility]  ? HIV (human immunodeficiency virus infection) ([place])   ? HOH (hard of hearing)   ? Hypertension   ? Myocardial infarction [facility]) 1993  ? Neuropathy   ? Seizures ([place])   ? not considered seizures but does "black out" and unsure of what happened. happens when she overheats. last episode 4 days ago.  ? Stroke [facility])   ? no defecits  ?  ?Past Surgical History:  ?Procedure Laterality Date  ? ABDOMINAL HYSTERECTOMY  1970  ? BACK SURGERY    ? no metal  ? CATARACT EXTRACTION W/PHACO Right 06/21/2017  ? Procedure: CATARACT EXTRACTION PHACO AND INTRAOCULAR LENS PLACEMENT (IOC);  Surgeon: Birder Robson, MD;  Location: [facility] ORS;  Service: Ophthalmology;  Laterality: Right;  [place] 00:30.9 ?AP% 12.7 ?CDE 3.94 ?Fluid Pack Lot # [id number]  ? CATARACT EXTRACTION W/PHACO Left 07/12/2017  ? Procedure: CATARACT EXTRACTION PHACO AND INTRAOCULAR LENS PLACEMENT (IOC);  Surgeon: Birder Robson, MD;  Location: [facility] ORS;  Service: Ophthalmology;  Laterality: Left;  [place]  00:42 ?AP%  15.2 ?CDE 6.38 ?Fluid pack lot # [id number]  ? COLONOSCOPY WITH PROPOFOL N/A 12/09/2020  ? Procedure: COLONOSCOPY WITH PROPOFOL;  Surgeon: Jonathon Bellows, MD;  Location: [facility] ENDOSCOPY;  Service: Gastroenterology;  Laterality: N/A;  ? DIAGNOSTIC LAPAROSCOPY    ? FRACTURE SURGERY Left   ? wrist  ? HERNIA REPAIR  [id number]  ? umbilical  ? JOINT REPLACEMENT Bilateral   ? KNEE CLOSED REDUCTION Left 12/30/2016  ? Procedure: CLOSED MANIPULATION KNEE;  Surgeon: Hessie Knows, MD;  Location: [facility] ORS;  Service: Orthopedics;  Laterality: Left;  ? LAPAROTOMY    ? TONSILLECTOMY    ? TOTAL KNEE  ARTHROPLASTY Right 07/15/2016  ? Procedure: TOTAL KNEE ARTHROPLASTY;  Surgeon: Hessie Knows, MD;  Location: [facility] ORS;  Service: Orthopedics;  Laterality: Right;  ? TOTAL KNEE ARTHROPLASTY Left 10/26/2016  ? Procedure: TOTAL KNEE ARTHROPLASTY;  Surgeon: Hessie Knows, MD;  Location: [facility] ORS;  Service: Orthopedics;  Laterality: Left;  ? ?Family History:  ?Family History  ?Problem Relation Age of Onset  ? Hypertension Father   ? ?Family Psychiatric  History: Brother and sister with depression ?Social History:  ?Social History  ? ?Substance and Sexual Activity  ?Alcohol Use No  ?   ?Social History  ? ?Substance and Sexual Activity  ?Drug Use No  ?  ?Social History  ? ?Socioeconomic History  ? Marital status: Widowed  ?  Spouse name: Not on file  ? Number of children: Not on file  ? Years of education: Not on file  ? Highest education level: Not on file  ?Occupational History  ? Not on file  ?Tobacco Use  ? Smoking status: Every Day  ?  Packs/day: 0.50  ?  Years: 45.00  ?  Pack years: 22.50  ?  Types: Cigarettes  ? Smokeless tobacco: Never  ?Vaping Use  ? Vaping Use: Never used  ?Substance and Sexual Activity  ? Alcohol use: No  ? Drug use: No  ? Sexual activity: Yes  ?Other Topics Concern  ? Not on file  ?Social History Narrative  ? Not on file  ? ?Social Determinants of Health  ? ?Financial Resource Strain: Not on file  ?Food Insecurity: Not on file  ?Transportation Needs: Not on file  ?Physical Activity: Not on file  ?Stress: Not on file  ?Social Connections: Not on file  ? ?Additional Social History: ?  ? ?Allergies:   ?Allergies  ?Allergen Reactions  ? Penicillins Swelling and Rash  ?  Has patient had a PCN reaction causing immediate rash, facial/tongue/throat swelling, SOB or lightheadedness with hypotension: Yes ?Has patient had a PCN reaction causing severe rash involving mucus membranes or skin necrosis: No ?Has patient had a PCN reaction that required hospitalization Yes ?Has patient had a PCN reaction occurring  within the last 10 years: No ?If all of the above answers are "NO", then may proceed with Cephalosporin use. ?  ? ? ?Labs:  ?Results for orders placed or performed during the hospital encounter of 02/28/22 (from the past 48 hour(s))  ?CBC with Differential     Status: Abnormal  ? Collection Time: 02/28/22 11:40 AM  ?Result Value Ref Range  ? WBC 11.3 (H) 4.0 - 10.5 K/uL  ? RBC 4.83 3.87 - 5.11 MIL/uL  ? Hemoglobin 14.7 12.0 - 15.0 g/dL  ? HCT 44.9 36.0 - 46.0 %  ? MCV 93.0 80.0 - 100.0 fL  ? MCH 30.4 26.0 - 34.0 pg  ? MCHC 32.7 30.0 - 36.0 g/dL  ? RDW  13.9 11.5 - 15.5 %  ? Platelets 323 150 - 400 K/uL  ? nRBC 0.0 0.0 - 0.2 %  ? Neutrophils Relative % 81 %  ? Neutro Abs 9.1 (H) 1.7 - 7.7 K/uL  ? Lymphocytes Relative 13 %  ? Lymphs Abs 1.5 0.7 - 4.0 K/uL  ? Monocytes Relative 6 %  ? Monocytes Absolute 0.7 0.1 - 1.0 K/uL  ? Eosinophils Relative 0 %  ? Eosinophils Absolute 0.0 0.0 - 0.5 K/uL  ? Basophils Relative 0 %  ? Basophils Absolute 0.0 0.0 - 0.1 K/uL  ? Immature Granulocytes 0 %  ? Abs Immature Granulocytes 0.05 0.00 - 0.07 K/uL  ?  Comment: Performed at [facility] [address] [place], [place] [address]  ?Comprehensive metabolic panel     Status: Abnormal  ? Collection Time: 02/28/22 11:40 AM  ?Result Value Ref Range  ? Sodium 138 135 - 145 mmol/L  ? Potassium 2.9 (L) 3.5 - 5.1 mmol/L  ? Chloride 97 (L) 98 - 111 mmol/L  ? CO2 26 22 - 32 mmol/L  ? Glucose, Bld 115 (H) 70 - 99 mg/dL  ?  Comment: Glucose reference range applies only to samples taken after fasting for at least 8 hours.  ? BUN 14 8 - 23 mg/dL  ? Creatinine, Ser 0.85 0.44 - 1.00 mg/dL  ? Calcium 9.7 8.9 - 10.3 mg/dL  ? Total Protein 8.9 (H) 6.5 - 8.1 g/dL  ? Albumin 4.2 3.5 - 5.0 g/dL  ? AST 23 15 - 41 U/L  ? ALT 15 0 - 44 U/L  ? Alkaline Phosphatase 123 38 - 126 U/L  ? Total Bilirubin 0.6 0.3 - 1.2 mg/dL  ? GFR, Estimated >60 >60 mL/min  ?  Comment: (NOTE) ?Calculated using the CKD-EPI Creatinine Equation (2021) ?  ? Anion gap 15 5 -  15  ?  Comment: Performed at [facility], [address], [place], [place] [address]  ?Salicylate level     Status: Abnormal  ? Collection Time: 02/28/22 11:40 AM  ?Result Value Ref Range  ? Salicylate

## 2022-03-01 NOTE — Progress Notes (Signed)
[facility] follow up note ? ?S: Patient seen. In good spirits. Tolerating clears. ?Only very mild lower abdominal soreness. CT showed no significant esophageal inflammation. Mild supraglottic swelling noted, no masses. Some regional lymphadenopathy.  ? ?Vital signs reviewed and stable.  ? ?HEENT: [place]-AT. PERRLA, EOMI. Sclerae anicteric. ?Neck: Supple i could not appreciate significant adenopathy. ?Chest: CTA ?CV: RRR, no g allop. ?Abd: Soft, mild lower quadrant tenderness bilaterally without rebound or guarding. No masses ?Ext: No significant edema.  ?Neuro: Alert and Oriented x 3. Nonfical. No acute distress currently.  ?Musculoskeletal: No atrophy. ? ?Impression: ? ?1. Ingestion of caustic liquids/pesticide- Stable without gross chemjcal damage per Contrasted CT of the Neck/soft tissues, Chest, abdomen and Pelvis. ?2. Substance abuse.< BR>3. Suicidal ideation - Prompting ingestion above. Awaiting psychiatric transfer. ? ?Recommendations: ? ?1. Continue to monitor status as you advance diet. ?2. Psychiatric evaluation and management. ?3. Advance diet as tolerated. ?4. GI will sign off for now. Call back in the interim if needed.  Thank you!  ? ?Raeanna Soberanes K. Alice Reichert, M.D. ?[facility]

## 2022-03-01 NOTE — Progress Notes (Signed)
?Progress Note ? ? ? ?Lisa Hawkins   ?JEH:[id number]  ?DOB: 13-Mar-1951  ?DOA: 02/28/2022     1 ?PCP: Donnie Coffin, MD ? ?Initial CC: Intentional overdose ? ?Hospital Course: ?Lisa Hawkins is a 71 yo female with PMH substance abuse (cocaine), obesity, depression/anxiety, HTN, CAD, HCV s/p treatment with [facility] who was brought to the ER after intentional overdose of one cup of scrubs free with Oxy clean bathroom cleaner and a large spoonful of orthene fire ant killer (organophosphate).  ?She states that she was tired of living the life she had been living full of addiction to cocaine.  She has been unable to quit despite wanting to.  She says she was in rehab approximately 5 years ago and the day she got out she began using again.  Her significant other is also a substance abuser with cocaine.  She does not feel that she can remain sober at home however she has nowhere else to go. ? ?She underwent CT neck as well as chest/abdomen/pelvis on admission.  She was found to have soft tissue swelling in the supraglottic airway which was poorly evaluated.  Remainder of CT imaging did not show any other concern for mucosal damage. ?Poison control was made aware and she was recommended to be monitored for organophosphate poisoning for at least 8 hours. ?Psychiatry was also consulted on admission. ? ?Interval History:  ?Seen this morning in her room with sitter bedside.  She was awake and alert.  She endorsed longstanding history of ongoing addiction to crack cocaine.  She can not go more than 3 days without it approximately.  ?She also endorses that her significant other also uses cocaine and it would be difficult for her to remain sober if she returned to that environment. ? ?Assessment and Plan: ?* Drug overdose, intentional ([place]) ?- Patient presents to the ER for evaluation following an intentional drug overdose. She admits to taking a large spoonful of orthene fire ant killer and one cup of scrub 3 with oxy clean ?-She  had odynophagia on admission which has improved some.  This morning she is having some right-sided abdominal pain but also endorsing feeling hungry and wishing to eat.  Denies any nausea or vomiting ?-She does have some increased salivation but denied any other symptoms consistent with organophosphate poisoning ?-GI following, appreciate assistance ?- Okay to start clear liquid diet this morning and observe tolerance; discussed with GI ? ?Suicide attempt [facility]) ?- Patient has a history of depression and substance abuse and presents to the ER for evaluation following an intentional overdose. ?-Hospitalized 5 years ago for similar in rehab but began using again once she got out ?-Unfortunately home situation does not appear to be safe or a good influence to keep her sober ?-Landscape architect for now.  If tries to leave would need IVC ?- Psychiatry consulted, follow-up recommendations ? ?History of cocaine use ?Patient admits to polysubstance abuse ?Patient has been counseled on the need to abstain from illicit drug use ? ?HIV (human immunodeficiency virus infection) ([place]) ?Patient not on HAART ?-Check HIV ? ?Hypomagnesemia ?Supplement magnesium ? ?Hypokalemia ?Most likely related to hydrochlorothiazide use ?Supplement potassium ? ?Tobacco use disorder ?Smoking cessation has been discussed with patient in detail ?We will place patient on nicotine transdermal patch 21 mg daily ? ?Obesity ?Complicates overall prognosis and care ?Lifestyle modification and exercise has been discussed with patient in detail ? ? ?Old records reviewed in assessment of this patient ? ?Antimicrobials: ? ? ?DVT prophylaxis:  ?  SCDs Start: 02/28/22 1423 ? ? ?Code Status:   Code Status: Full Code ? ?Disposition Plan: To be determined pending psychiatry evaluation ?Status is: Inpatient ? ?Objective: ?Blood pressure (!) 127/59, pulse 99, temperature 98.7 ?F (37.1 ?C), resp. rate 20, height '5\' 3"'$  (1.6 m), weight 93 kg, SpO2 94 %.  ?Examination:   ?Physical Exam ?Constitutional:   ?   Comments: Cooperative pleasant awake and alert  ?HENT:  ?   Head: Normocephalic and atraumatic.  ?   Mouth/Throat:  ?   Mouth: Mucous membranes are moist.  ?Eyes:  ?   Extraocular Movements: Extraocular movements intact.  ?   Comments: Pupils approximately 1 mm and equal  ?Neck:  ?   Comments: Patient denied any odynophagia ?Cardiovascular:  ?   Rate and Rhythm: Normal rate and regular rhythm.  ?Pulmonary:  ?   Effort: Pulmonary effort is normal.  ?   Breath sounds: Normal breath sounds.  ?Abdominal:  ?   General: Bowel sounds are normal. There is no distension.  ?   Palpations: Abdomen is soft.  ?   Comments: Increased tenderness to palpation in right upper and lower quadrants.  No rebound or guarding.  Bowel sounds present  ?Musculoskeletal:     ?   General: Normal range of motion.  ?   Cervical back: Normal range of motion and neck supple.  ?Skin: ?   General: Skin is warm and dry.  ?Neurological:  ?   General: No focal deficit present.  ?Psychiatric:     ?   Mood and Affect: Mood normal.     ?   Behavior: Behavior normal.  ?  ? ?Consultants:  ?GI ?Psychiatry ? ?Procedures:  ? ? ?Data Reviewed: ?Results for orders placed or performed during the hospital encounter of 02/28/22 (from the past 24 hour(s))  ?CBC with Differential     Status: Abnormal  ? Collection Time: 02/28/22 11:40 AM  ?Result Value Ref Range  ? WBC 11.3 (H) 4.0 - 10.5 K/uL  ? RBC 4.83 3.87 - 5.11 MIL/uL  ? Hemoglobin 14.7 12.0 - 15.0 g/dL  ? HCT 44.9 36.0 - 46.0 %  ? MCV 93.0 80.0 - 100.0 fL  ? MCH 30.4 26.0 - 34.0 pg  ? MCHC 32.7 30.0 - 36.0 g/dL  ? RDW 13.9 11.5 - 15.5 %  ? Platelets 323 150 - 400 K/uL  ? nRBC 0.0 0.0 - 0.2 %  ? Neutrophils Relative % 81 %  ? Neutro Abs 9.1 (H) 1.7 - 7.7 K/uL  ? Lymphocytes Relative 13 %  ? Lymphs Abs 1.5 0.7 - 4.0 K/uL  ? Monocytes Relative 6 %  ? Monocytes Absolute 0.7 0.1 - 1.0 K/uL  ? Eosinophils Relative 0 %  ? Eosinophils Absolute 0.0 0.0 - 0.5 K/uL  ? Basophils  Relative 0 %  ? Basophils Absolute 0.0 0.0 - 0.1 K/uL  ? Immature Granulocytes 0 %  ? Abs Immature Granulocytes 0.05 0.00 - 0.07 K/uL  ?Comprehensive metabolic panel     Status: Abnormal  ? Collection Time: 02/28/22 11:40 AM  ?Result Value Ref Range  ? Sodium 138 135 - 145 mmol/L  ? Potassium 2.9 (L) 3.5 - 5.1 mmol/L  ? Chloride 97 (L) 98 - 111 mmol/L  ? CO2 26 22 - 32 mmol/L  ? Glucose, Bld 115 (H) 70 - 99 mg/dL  ? BUN 14 8 - 23 mg/dL  ? Creatinine, Ser 0.85 0.44 - 1.00 mg/dL  ? Calcium 9.7 8.9 - 10.3  mg/dL  ? Total Protein 8.9 (H) 6.5 - 8.1 g/dL  ? Albumin 4.2 3.5 - 5.0 g/dL  ? AST 23 15 - 41 U/L  ? ALT 15 0 - 44 U/L  ? Alkaline Phosphatase 123 38 - 126 U/L  ? Total Bilirubin 0.6 0.3 - 1.2 mg/dL  ? GFR, Estimated >60 >60 mL/min  ? Anion gap 15 5 - 15  ?Salicylate level     Status: Abnormal  ? Collection Time: 02/28/22 11:40 AM  ?Result Value Ref Range  ? Salicylate Lvl <8.8 (L) 7.0 - 30.0 mg/dL  ?Magnesium     Status: None  ? Collection Time: 02/28/22 11:40 AM  ?Result Value Ref Range  ? Magnesium 1.7 1.7 - 2.4 mg/dL  ?Acetaminophen level     Status: Abnormal  ? Collection Time: 02/28/22 11:40 AM  ?Result Value Ref Range  ? Acetaminophen (Tylenol), Serum <10 (L) 10 - 30 ug/mL  ?Ethanol     Status: None  ? Collection Time: 02/28/22  4:28 PM  ?Result Value Ref Range  ? Alcohol, Ethyl (B) <10 <10 mg/dL  ?Acetaminophen level     Status: Abnormal  ? Collection Time: 02/28/22  4:28 PM  ?Result Value Ref Range  ? Acetaminophen (Tylenol), Serum <10 (L) 10 - 30 ug/mL  ?Basic metabolic panel     Status: Abnormal  ? Collection Time: 03/01/22  4:08 AM  ?Result Value Ref Range  ? Sodium 139 135 - 145 mmol/L  ? Potassium 3.2 (L) 3.5 - 5.1 mmol/L  ? Chloride 104 98 - 111 mmol/L  ? CO2 25 22 - 32 mmol/L  ? Glucose, Bld 102 (H) 70 - 99 mg/dL  ? BUN 10 8 - 23 mg/dL  ? Creatinine, Ser 0.72 0.44 - 1.00 mg/dL  ? Calcium 8.5 (L) 8.9 - 10.3 mg/dL  ? GFR, Estimated >60 >60 mL/min  ? Anion gap 10 5 - 15  ?CBC     Status: Abnormal  ?  Collection Time: 03/01/22  4:08 AM  ?Result Value Ref Range  ? WBC 10.7 (H) 4.0 - 10.5 K/uL  ? RBC 4.28 3.87 - 5.11 MIL/uL  ? Hemoglobin 13.3 12.0 - 15.0 g/dL  ? HCT 39.9 36.0 - 46.0 %  ? MCV 93.2 80.0 - 100.0 fL  ? MCH

## 2022-03-02 ENCOUNTER — Other Ambulatory Visit: Payer: Self-pay

## 2022-03-02 ENCOUNTER — Encounter: Payer: Self-pay | Admitting: Psychiatry

## 2022-03-02 ENCOUNTER — Inpatient Hospital Stay
Admission: AD | Admit: 2022-03-02 | Discharge: 2022-03-11 | DRG: 885 | Disposition: A | Discharge status: Home / Self Care | Payer: Medicare HMO | Source: Intra-hospital | Attending: Psychiatry | Admitting: Psychiatry

## 2022-03-02 DIAGNOSIS — Z880 Allergy status to penicillin: Secondary | ICD-10-CM | POA: Diagnosis not present

## 2022-03-02 DIAGNOSIS — I10 Essential (primary) hypertension: Secondary | ICD-10-CM | POA: Diagnosis present

## 2022-03-02 DIAGNOSIS — Z90710 Acquired absence of both cervix and uterus: Secondary | ICD-10-CM

## 2022-03-02 DIAGNOSIS — Z7982 Long term (current) use of aspirin: Secondary | ICD-10-CM | POA: Diagnosis not present

## 2022-03-02 DIAGNOSIS — T594X2A Toxic effect of chlorine gas, intentional self-harm, initial encounter: Secondary | ICD-10-CM | POA: Diagnosis present

## 2022-03-02 DIAGNOSIS — B20 Human immunodeficiency virus [HIV] disease: Secondary | ICD-10-CM | POA: Diagnosis present

## 2022-03-02 DIAGNOSIS — Z79899 Other long term (current) drug therapy: Secondary | ICD-10-CM | POA: Diagnosis not present

## 2022-03-02 DIAGNOSIS — Z96653 Presence of artificial knee joint, bilateral: Secondary | ICD-10-CM | POA: Diagnosis present

## 2022-03-02 DIAGNOSIS — Z9151 Personal history of suicidal behavior: Secondary | ICD-10-CM

## 2022-03-02 DIAGNOSIS — F1920 Other psychoactive substance dependence, uncomplicated: Secondary | ICD-10-CM | POA: Diagnosis not present

## 2022-03-02 DIAGNOSIS — F332 Major depressive disorder, recurrent severe without psychotic features: Secondary | ICD-10-CM | POA: Diagnosis present

## 2022-03-02 DIAGNOSIS — F17210 Nicotine dependence, cigarettes, uncomplicated: Secondary | ICD-10-CM | POA: Diagnosis present

## 2022-03-02 DIAGNOSIS — Z20822 Contact with and (suspected) exposure to covid-19: Secondary | ICD-10-CM | POA: Diagnosis present

## 2022-03-02 DIAGNOSIS — Z8673 Personal history of transient ischemic attack (TIA), and cerebral infarction without residual deficits: Secondary | ICD-10-CM

## 2022-03-02 LAB — COMPREHENSIVE METABOLIC PANEL
ALT: 11 U/L (ref 0–44)
AST: 16 U/L (ref 15–41)
Albumin: 3.3 g/dL — ABNORMAL LOW (ref 3.5–5.0)
Alkaline Phosphatase: 87 U/L (ref 38–126)
Anion gap: 6 (ref 5–15)
BUN: 7 mg/dL — ABNORMAL LOW (ref 8–23)
CO2: 25 mmol/L (ref 22–32)
Calcium: 8.5 mg/dL — ABNORMAL LOW (ref 8.9–10.3)
Chloride: 107 mmol/L (ref 98–111)
Creatinine, Ser: 0.71 mg/dL (ref 0.44–1.00)
GFR, Estimated: >60 mL/min (ref >60)
Glucose, Bld: 94 mg/dL (ref 70–99)
Potassium: 4.5 mmol/L (ref 3.5–5.1)
Sodium: 138 mmol/L (ref 135–145)
Total Bilirubin: 0.5 mg/dL (ref 0.3–1.2)
Total Protein: 7.2 g/dL (ref 6.5–8.1)

## 2022-03-02 LAB — CBC WITH DIFFERENTIAL/PLATELET
Abs Immature Granulocytes: 0.03 10*3/uL (ref 0.00–0.07)
Basophils Absolute: 0 10*3/uL (ref 0.0–0.1)
Basophils Relative: 0 %
Eosinophils Absolute: 0.2 10*3/uL (ref 0.0–0.5)
Eosinophils Relative: 2 %
HCT: 39.2 % (ref 36.0–46.0)
Hemoglobin: 12.6 g/dL (ref 12.0–15.0)
Immature Granulocytes: 0 %
Lymphocytes Relative: 36 %
Lymphs Abs: 2.7 10*3/uL (ref 0.7–4.0)
MCH: 30.6 pg (ref 26.0–34.0)
MCHC: 32.1 g/dL (ref 30.0–36.0)
MCV: 95.1 fL (ref 80.0–100.0)
Monocytes Absolute: 0.7 10*3/uL (ref 0.1–1.0)
Monocytes Relative: 9 %
Neutro Abs: 3.9 10*3/uL (ref 1.7–7.7)
Neutrophils Relative %: 53 %
Platelets: 257 10*3/uL (ref 150–400)
RBC: 4.12 MIL/uL (ref 3.87–5.11)
RDW: 14.3 % (ref 11.5–15.5)
WBC: 7.5 10*3/uL (ref 4.0–10.5)
nRBC: 0 % (ref 0.0–0.2)

## 2022-03-02 LAB — RESP PANEL BY RT-PCR (FLU A&B, COVID) ARPGX2
Influenza A by PCR: NEGATIVE
Influenza B by PCR: NEGATIVE
SARS Coronavirus 2 by RT PCR: NEGATIVE

## 2022-03-02 LAB — MAGNESIUM: Magnesium: 1.9 mg/dL (ref 1.7–2.4)

## 2022-03-02 LAB — HIV ANTIBODY (ROUTINE TESTING W REFLEX): HIV Screen 4th Generation wRfx: REACTIVE — AB

## 2022-03-02 MED ORDER — NAPROXEN 500 MG PO TABS
500.0000 mg | ORAL_TABLET | Freq: Two times a day (BID) | ORAL | Status: DC
  Administered 2022-03-02 – 2022-03-04 (×4): 500 mg via ORAL
  Filled 2022-03-02 (×4): qty 1

## 2022-03-02 MED ORDER — TRAZODONE HCL 100 MG PO TABS
100.0000 mg | ORAL_TABLET | Freq: Every evening | ORAL | Status: DC | PRN
  Administered 2022-03-08 – 2022-03-10 (×3): 100 mg via ORAL
  Filled 2022-03-02 (×4): qty 1

## 2022-03-02 MED ORDER — ONDANSETRON HCL 4 MG/2ML IJ SOLN: 4.0000 mg | Freq: Four times a day (QID) | INTRAMUSCULAR | Status: DC | PRN

## 2022-03-02 MED ORDER — PANTOPRAZOLE SODIUM 40 MG PO TBEC
40.0000 mg | DELAYED_RELEASE_TABLET | Freq: Every day | ORAL | Status: DC
  Administered 2022-03-02: 40 mg via ORAL
  Filled 2022-03-02: qty 1

## 2022-03-02 MED ORDER — NICOTINE 21 MG/24HR TD PT24
21.0000 mg | MEDICATED_PATCH | Freq: Every day | TRANSDERMAL | Status: DC
  Administered 2022-03-03 – 2022-03-11 (×9): 21 mg via TRANSDERMAL
  Filled 2022-03-02 (×9): qty 1

## 2022-03-02 MED ORDER — ASPIRIN EC 81 MG PO TBEC
81.0000 mg | DELAYED_RELEASE_TABLET | Freq: Every day | ORAL | Status: DC
  Administered 2022-03-02 – 2022-03-11 (×10): 81 mg via ORAL
  Filled 2022-03-02 (×10): qty 1

## 2022-03-02 MED ORDER — DIPHENOXYLATE-ATROPINE 2.5-0.025 MG PO TABS
1.0000 | ORAL_TABLET | Freq: Four times a day (QID) | ORAL | Status: DC | PRN
  Administered 2022-03-10: 1 via ORAL
  Filled 2022-03-02: qty 1

## 2022-03-02 MED ORDER — LORAZEPAM 2 MG/ML IJ SOLN: 2.0000 mg | INTRAMUSCULAR | Status: DC | PRN

## 2022-03-02 MED ORDER — MAGNESIUM HYDROXIDE 400 MG/5ML PO SUSP: 30.0000 mL | Freq: Every day | ORAL | Status: DC | PRN

## 2022-03-02 MED ORDER — LORAZEPAM 1 MG PO TABS: 1.0000 mg | ORAL_TABLET | Freq: Three times a day (TID) | ORAL | Status: DC | PRN

## 2022-03-02 MED ORDER — OLANZAPINE 5 MG PO TABS: 10.0000 mg | ORAL_TABLET | Freq: Four times a day (QID) | ORAL | Status: DC | PRN

## 2022-03-02 MED ORDER — ACETAMINOPHEN 325 MG PO TABS
650.0000 mg | ORAL_TABLET | Freq: Four times a day (QID) | ORAL | Status: DC | PRN
  Administered 2022-03-08 – 2022-03-11 (×6): 650 mg via ORAL
  Filled 2022-03-02 (×6): qty 2

## 2022-03-02 MED ORDER — ONDANSETRON HCL 4 MG PO TABS: 4.0000 mg | ORAL_TABLET | Freq: Four times a day (QID) | ORAL | Status: DC | PRN

## 2022-03-02 MED ORDER — AMITRIPTYLINE HCL 25 MG PO TABS
100.0000 mg | ORAL_TABLET | Freq: Every day | ORAL | Status: DC
  Administered 2022-03-02 – 2022-03-10 (×9): 100 mg via ORAL
  Filled 2022-03-02 (×9): qty 4

## 2022-03-02 MED ORDER — HYDROCHLOROTHIAZIDE 25 MG PO TABS
25.0000 mg | ORAL_TABLET | Freq: Every day | ORAL | Status: DC
  Administered 2022-03-02 – 2022-03-04 (×3): 25 mg via ORAL
  Filled 2022-03-02 (×3): qty 1

## 2022-03-02 MED ORDER — DIPHENOXYLATE-ATROPINE 2.5-0.025 MG PO TABS
1.0000 | ORAL_TABLET | Freq: Four times a day (QID) | ORAL | Status: DC | PRN
  Administered 2022-03-02: 1 via ORAL
  Filled 2022-03-02: qty 1

## 2022-03-02 MED ORDER — PANTOPRAZOLE SODIUM 40 MG PO TBEC
40.0000 mg | DELAYED_RELEASE_TABLET | Freq: Every day | ORAL | Status: DC
  Administered 2022-03-03 – 2022-03-04 (×2): 40 mg via ORAL
  Filled 2022-03-02 (×2): qty 1

## 2022-03-02 MED ORDER — ATORVASTATIN CALCIUM 10 MG PO TABS
40.0000 mg | ORAL_TABLET | Freq: Every day | ORAL | Status: DC
  Administered 2022-03-02 – 2022-03-10 (×9): 40 mg via ORAL
  Filled 2022-03-02 (×9): qty 4

## 2022-03-02 MED ORDER — ALUM & MAG HYDROXIDE-SIMETH 200-200-20 MG/5ML PO SUSP
30.0000 mL | ORAL | Status: DC | PRN
Start: 2022-03-02 — End: 2022-03-11

## 2022-03-02 NOTE — Progress Notes (Signed)
?   03/02/22 1800  ?Clinical Encounter Type  ?Visited With Patient  ?Visit Type Initial;Behavioral Health  ?Referral From Physician  ?Spiritual Encounters  ?Spiritual Needs Prayer  ? ?Patient shared her story and Chaplain provided supportive reflective listening, compassionate conversation and prayer. ?

## 2022-03-02 NOTE — Tx Team (Signed)
Initial Treatment Plan ?03/02/2022 ?5:22 PM ?[name] ?[id number] ? ? ? ?PATIENT STRESSORS: ?Financial difficulties   ? ? ?PATIENT STRENGTHS: ?Religious Affiliation  ?Supportive family/friends  ? ? ?PATIENT IDENTIFIED PROBLEMS: ?Cocaine/crack addiction  ?  ?  ?  ?  ?  ?  ?  ?  ?  ? ?DISCHARGE CRITERIA:  ?Improved stabilization in mood, thinking, and/or behavior ?Motivation to continue treatment in a less acute level of care ? ?PRELIMINARY DISCHARGE PLAN: ?Discharge to [facility].  ? ?PATIENT/FAMILY INVOLVEMENT: ?This treatment plan has been presented to and reviewed with the patient, Lisa Hawkins, The patient has been given the opportunity to ask questions and make suggestions. ? ?Melinda Crutch, RN ?03/02/2022, 5:22 PM ?

## 2022-03-02 NOTE — Discharge Summary (Signed)
?Physician Discharge Summary ?  ?Patient: Lisa Hawkins MRN: [id number] DOB: [birth year]  ?Admit date:     02/28/2022  ?Discharge date: 03/02/22  ?Discharge Physician: Fritzi Mandes  ? ?PCP: Donnie Coffin, MD  ? ?Recommendations at discharge:  ? ? F/u your PCP after d/c from hospital ? ?Discharge Diagnoses: ?drug overdose, intentional (organophosphate) ?history of cocaine abuse ? ?Hospital Course: ?Lisa Hawkins is a 71 yo female with PMH substance abuse (cocaine), obesity, depression/anxiety, HTN, CAD, HCV s/p treatment with [facility] who was brought to the ER after intentional overdose of one cup of scrubs free with Oxy clean bathroom cleaner and a large spoonful of orthene fire ant killer (organophosphate).  ?She states that she was tired of living the life she had been living full of addiction to cocaine.  ? ?Drug overdose, intentional ?-- patient came in the ER after intentional drug overdose taking large amount of fire ant killer and 1 cup of oxy clean ?-- patient did have some odynophagia on admission which has improved. She is able to swallow soft diet. ?-- Seen by [facility] Dr. Alice Reichert ?-- was on clear liquid tolerated well advance to soft diet no issues per RN ?-- patient seen by psychiatry-- discussed with Tyler Aas, NP and okay to transfer to [facility] today. ?--COVID negative ?-- per psych patient will continue with IVC in [place]. ? ?History of chronic cocaine abuse ?polysubstance abuse ?-- will defer to psychiatry ? ?history of HIV ?-- not on any meds ? ?tobacco abuse ?-- recommended cessation ?-- nicotine patch ? ?patient will discharge to behavioral inpatient unit/[facility] ? ?  ? ? ?Consultants: psychiatry, GI ?Disposition:  BHU/Inpt geripsych at [facility] ?Diet recommendation:  ?Discharge Diet Orders (From admission, onward)  ? ?  Start     Ordered  ? 03/02/22 0000  Diet - low sodium heart healthy       ? 03/02/22 1529  ? ?  ?  ? ?  ? ?Cardiac diet ?DISCHARGE MEDICATION: ?Allergies as of 03/02/2022   ? ?    Reactions  ? Penicillins Swelling, Rash  ? Has patient had a PCN reaction causing immediate rash, facial/tongue/throat swelling, SOB or lightheadedness with hypotension: Yes ?Has patient had a PCN reaction causing severe rash involving mucus membranes or skin necrosis: No ?Has patient had a PCN reaction that required hospitalization Yes ?Has patient had a PCN reaction occurring within the last 10 years: No ?If all of the above answers are "NO", then may proceed with Cephalosporin use.  ? ?  ? ?  ?Medication List  ?  ? ?STOP taking these medications   ? ?Adult Blood Pressure Cuff Lg Kit ?  ?amitriptyline 100 MG tablet ?Commonly known as: ELAVIL ?  ?amLODipine 10 MG tablet ?Commonly known as: NORVASC ?  ?Calcium 600+D 600-10 MG-MCG Tabs ?Generic drug: Calcium Carb-Cholecalciferol ?  ?chlorhexidine 0.12 % solution ?Commonly known as: PERIDEX ?  ?gabapentin 400 MG capsule ?Commonly known as: NEURONTIN ?  ?hydrochlorothiazide 25 MG tablet ?Commonly known as: HYDRODIURIL ?  ?naproxen 500 MG tablet ?Commonly known as: NAPROSYN ?  ?oxyCODONE-acetaminophen 5-325 MG tablet ?Commonly known as: PERCOCET/ROXICET ?  ?Ozempic (1 MG/DOSE) 4 MG/3ML Sopn ?Generic drug: Semaglutide (1 MG/DOSE) ?  ?PARoxetine 40 MG tablet ?Commonly known as: PAXIL ?  ?paroxetine mesylate 40 MG tablet ?Commonly known as: PEXEVA ?  ?potassium chloride SA 20 MEQ tablet ?Commonly known as: KLOR-CON M ?  ?Triumeq 600-50-300 MG tablet ?Generic drug: abacavir-dolutegravir-lamiVUDine ?  ? ?  ? ?TAKE these medications   ? ?  Aspirin 81 MG Caps ?Take 1 capsule by mouth daily. ?  ?atorvastatin 40 MG tablet ?Commonly known as: LIPITOR ?Take 40 mg by mouth at bedtime. ?  ?famotidine 40 MG tablet ?Commonly known as: PEPCID ?Take 1 tablet by mouth as needed. ?  ?Nicoderm CQ 21 mg/24hr patch ?Generic drug: nicotine ?21 mg daily. ?  ?omeprazole 20 MG capsule ?Commonly known as: PRILOSEC ?Take 20 mg by mouth daily. ?  ? ?  ? ? Follow-up Information   ? ? Aycock, Ngwe A,  MD. Schedule an appointment as soon as possible for a visit in 2 week(s).   ?Specialty: Family Medicine ?Why: f/u after discharge from hospital ?Contact information: ?[address] ?[place] [address] ?[phone] ? ? ?  ?  ? ?  ?  ? ?  ? ?Discharge Exam: ?Filed Weights  ? 02/28/22 1131  ?Weight: 93 kg  ? ? ? ?Condition at discharge: fair ? ?The results of significant diagnostics from this hospitalization (including imaging, microbiology, ancillary and laboratory) are listed below for reference.  ? ?Imaging Studies: ?CT SOFT TISSUE NECK W CONTRAST ? ?Result Date: 02/28/2022 ?CLINICAL DATA:  Esophageal perforation EXAM: CT NECK WITH CONTRAST TECHNIQUE: Multidetector CT imaging of the neck was performed using the standard protocol following the bolus administration of intravenous contrast. RADIATION DOSE REDUCTION: This exam was performed according to the departmental dose-optimization program which includes automated exposure control, adjustment of the mA and/or kV according to patient size and/or use of iterative reconstruction technique. CONTRAST:  169m OMNIPAQUE IOHEXOL 300 MG/ML  SOLN COMPARISON:  03/14/2012. FINDINGS: Evaluation is limited by motion artifact and beam hardening artifact. Pharynx and larynx: Possible mild soft tissue swelling in the supraglottic area (series 7, image 66), although this area appears quite motion limited. No significant mass. No paraesophageal air or fluid to suggest perforation. Salivary glands: Motion limited.  Grossly negative. Thyroid: Normal. Lymph nodes: Definite lymphadenopathy. Vascular: Motion limited, without focal abnormality. Limited intracranial: Negative. Visualized orbits: Status post bilateral lens replacements. Otherwise negative. Mastoids and visualized paranasal sinuses: Clear. Skeleton: Motion limited, no acute osseous abnormality. Upper chest: No focal pulmonary opacity or pleural effusion. Other: None. IMPRESSION: Evaluation is significantly motion  limited and is also limited by beam hardening artifact. Within these limitations, there is no evidence of esophageal perforation; no paraesophageal air or fluid. There is possible soft tissue swelling in the supraglottic airway, which is poorly evaluated but could indicate mucosal damage. Consider direct visualization. Electronically Signed   By: AMerilyn BabaM.D.   On: 02/28/2022 16:21  ? ?CT CHEST ABDOMEN PELVIS W CONTRAST ? ?Result Date: 02/28/2022 ?CLINICAL DATA:  71year old female with ingestion of multiple caustic agents. Evaluate enteric tract for abnormality/possible perforation. Abdominal and pelvic pain with nausea and vomiting. EXAM: CT CHEST, ABDOMEN, AND PELVIS WITH CONTRAST TECHNIQUE: Multidetector CT imaging of the chest, abdomen and pelvis was performed following the standard protocol during bolus administration of intravenous contrast. RADIATION DOSE REDUCTION: This exam was performed according to the departmental dose-optimization program which includes automated exposure control, adjustment of the mA and/or kV according to patient size and/or use of iterative reconstruction technique. CONTRAST:  1061mOMNIPAQUE IOHEXOL 300 MG/ML  SOLN COMPARISON:  02/28/2022 chest radiograph, 04/12/2018 abdomen and pelvis CT and other studies. FINDINGS: CT CHEST FINDINGS Cardiovascular: Heart size is normal. Coronary artery and aortic atherosclerotic calcifications are identified. There is no evidence of thoracic aortic aneurysm or pericardial effusion. Mediastinum/Nodes: No esophageal abnormalities are noted. No pneumomediastinum, mediastinal fluid or paraesophageal  inflammation is identified. No mediastinal mass is noted. No significant thyroid or tracheal abnormalities are noted. Lungs/Pleura: Mild bibasilar atelectasis is noted. There is no evidence of airspace disease, pleural effusion, consolidation, mass, suspicious nodule or pneumothorax. Musculoskeletal: No acute or suspicious bony abnormalities are  identified. CT ABDOMEN PELVIS FINDINGS Hepatobiliary: A 1 cm gallstone is noted without CT evidence of acute cholecystitis. No biliary dilatation. The liver is unremarkable. Pancreas: Unremarkable Spleen: Andi Hence

## 2022-03-02 NOTE — Plan of Care (Signed)
?  Problem: Education: ?Goal: Knowledge of [name] Education information/materials will improve ?Outcome: Not Progressing ?Goal: Mental status will improve ?Outcome: Not Progressing ?  ?Problem: Coping: ?Goal: Ability to demonstrate self-control will improve ?Outcome: Not Progressing ?  ?Problem: Safety: ?Goal: Periods of time without injury will increase ?Outcome: Not Progressing ?  ?

## 2022-03-02 NOTE — Progress Notes (Addendum)
BP 120/70 (BP Location: Right Arm)   Pulse (!) 104   Temp (!) 97.3 ?F (36.3 ?C) (Oral)   Resp 20   Ht '5\' 3"'$  (1.6 m)   Wt 110.7 kg   SpO2 99%   BMI 43.22 kg/m?  ? ?Patient IVC and arrived to unit escorted by security. Pt A&Ox4. Vs stable. Patient ambulatory but has prior history of falls at home with stated dizziness/generalized weakness at times. Weakness also verbalized of L arm. Walker provided for assistance with ambulation. Pt incontinent at times and requires minimal assistance with ADLS. Skin assessment completed with the assistance of [place] RN. Skin is WDL. Soft diet. No drooling noted and denies any pain. Patient aware of cocaine addiction stating "I am a crack addict." Pt stated she was clean for 14 years before but always seems to relapse. One stressor stated is "being broke." Patient wishes to discharge to [facility] rehab or if not to return living with her fiance. Patient denies SI, HI, and A/V/H with no plan/intent.  Patient provided with meal tray, yellow fall risk bracelet, and clean scrubs/hygiene supplies. Bed in lowest position with call bell in place. Patient oriented to unit/unit rules. Patient verbalized all understanding and stated having no further questions at this moment.  ?

## 2022-03-03 ENCOUNTER — Encounter: Payer: Self-pay | Admitting: Psychiatry

## 2022-03-03 DIAGNOSIS — F332 Major depressive disorder, recurrent severe without psychotic features: Secondary | ICD-10-CM

## 2022-03-03 MED ORDER — FLUOXETINE HCL 20 MG PO CAPS
20.0000 mg | ORAL_CAPSULE | Freq: Every day | ORAL | Status: DC
  Administered 2022-03-03 – 2022-03-11 (×9): 20 mg via ORAL
  Filled 2022-03-03 (×9): qty 1

## 2022-03-03 MED ORDER — RISPERIDONE 1 MG PO TABS
0.5000 mg | ORAL_TABLET | ORAL | Status: DC
  Administered 2022-03-03 – 2022-03-11 (×17): 0.5 mg via ORAL
  Filled 2022-03-03 (×17): qty 1

## 2022-03-03 NOTE — Progress Notes (Signed)
Pt lying in bed with eyes closed; easily aroused when name called. Pt calm, cooperative. Pt states "I'm okay." She currently denies pain, SI/HI/AVH, anxiety and depression. She describes her sleep and appetite as "good". No acute distress noted. ?

## 2022-03-03 NOTE — Plan of Care (Signed)
Patient is alert and oriented times 4. Mood and affect appropriate. Patient denies pain. She denies SI, HI, and AVH. Also denies feelings of anxiety and depression at this time. States she slept good last night. Morning meds given whole by mouth W/O difficulty. Ate breakfast in day room- appetite good. Patient remains on unit with Q15 minute checks in place.  ? ? ? ?Problem: Education: ?Goal: Knowledge of [facility] Education information/materials will improve ?Outcome: Progressing ?Goal: Mental status will improve ?Outcome: Progressing ?  ?Problem: Coping: ?Goal: Ability to demonstrate self-control will improve ?Outcome: Progressing ?  ?Problem: Safety: ?Goal: Periods of time without injury will increase ?Outcome: Progressing ?  ?

## 2022-03-03 NOTE — Progress Notes (Signed)
Patient stated boyfriend would bring in HIV medications, as they are not listed in home medications. Dr. Louis Meckel aware. ?

## 2022-03-03 NOTE — BH IP Treatment Plan (Signed)
Interdisciplinary Treatment and Diagnostic Plan Update ? ?03/03/2022 ?Time of Session: 10:00AM ?Lisa Hawkins ?MRN: [id number] ? ?Principal Diagnosis: MDD (major depressive disorder), recurrent severe, without psychosis ([place]) ? ?Secondary Diagnoses: Principal Problem: ?  MDD (major depressive disorder), recurrent severe, without psychosis ([place]) ? ? ?Current Medications:  ?Current Facility-Administered Medications  ?Medication Dose Route Frequency Provider Last Rate Last Admin  ? acetaminophen (TYLENOL) tablet 650 mg  650 mg Oral Q6H PRN Parks Ranger, DO      ? alum & mag hydroxide-simeth (MAALOX/MYLANTA) 200-200-20 MG/5ML suspension 30 mL  30 mL Oral Q4H PRN Parks Ranger, DO      ? amitriptyline (ELAVIL) tablet 100 mg  100 mg Oral QHS Parks Ranger, DO   100 mg at 03/02/22 2208  ? aspirin EC tablet 81 mg  81 mg Oral Daily Parks Ranger, DO   81 mg at 03/03/22 1006  ? atorvastatin (LIPITOR) tablet 40 mg  40 mg Oral QHS Parks Ranger, DO   40 mg at 03/02/22 2208  ? diphenoxylate-atropine (LOMOTIL) 2.5-0.025 MG per tablet 1 tablet  1 tablet Oral QID PRN Parks Ranger, DO      ? hydrochlorothiazide (HYDRODIURIL) tablet 25 mg  25 mg Oral QHS Parks Ranger, DO   25 mg at 03/02/22 2208  ? LORazepam (ATIVAN) injection 2 mg  2 mg Intravenous Q4H PRN Parks Ranger, DO      ? LORazepam (ATIVAN) tablet 1 mg  1 mg Oral TID PRN Parks Ranger, DO      ? magnesium hydroxide (MILK OF MAGNESIA) suspension 30 mL  30 mL Oral Daily PRN Parks Ranger, DO      ? naproxen (NAPROSYN) tablet 500 mg  500 mg Oral BID Parks Ranger, DO   500 mg at 03/03/22 1006  ? nicotine (NICODERM CQ - dosed in mg/24 hours) patch 21 mg  21 mg Transdermal Daily Parks Ranger, DO   21 mg at 03/03/22 1006  ? OLANZapine (ZYPREXA) tablet 10 mg  10 mg Oral Q6H PRN Parks Ranger, DO      ? ondansetron [facility]) tablet 4 mg  4 mg Oral Q6H  PRN Parks Ranger, DO      ? Or  ? ondansetron (ZOFRAN) injection 4 mg  4 mg Intravenous Q6H PRN Parks Ranger, DO      ? pantoprazole (PROTONIX) EC tablet 40 mg  40 mg Oral Daily Parks Ranger, DO   40 mg at 03/03/22 1006  ? traZODone (DESYREL) tablet 100 mg  100 mg Oral QHS PRN Parks Ranger, DO      ? ?PTA Medications: ?Medications Prior to Admission  ?Medication Sig Dispense Refill Last Dose  ? Aspirin 81 MG CAPS Take 1 capsule by mouth daily.     ? atorvastatin (LIPITOR) 40 MG tablet Take 40 mg by mouth at bedtime.     ? famotidine (PEPCID) 40 MG tablet Take 1 tablet by mouth as needed.     ? NICODERM CQ 21 MG/24HR patch 21 mg daily.     ? omeprazole (PRILOSEC) 20 MG capsule Take 20 mg by mouth daily.     ? ? ?Patient Stressors: Financial difficulties   ? ?Patient Strengths: Religious Affiliation  ?Supportive family/friends  ? ?Treatment Modalities: Medication Management, Group therapy, Case management,  ?1 to 1 session with clinician, Psychoeducation, Recreational therapy. ? ? ?Physician Treatment Plan for Primary Diagnosis: MDD (major depressive disorder), recurrent severe,  without psychosis ([place]) ?Long Term Goal(s):    ? ?Short Term Goals:   ? ?Medication Management: Evaluate patient's response, side effects, and tolerance of medication regimen. ? ?Therapeutic Interventions: 1 to 1 sessions, Unit Group sessions and Medication administration. ? ?Evaluation of Outcomes: Not Met ? ?Physician Treatment Plan for Secondary Diagnosis: Principal Problem: ?  MDD (major depressive disorder), recurrent severe, without psychosis ([place]) ? ?Long Term Goal(s):    ? ?Short Term Goals:      ? ?Medication Management: Evaluate patient's response, side effects, and tolerance of medication regimen. ? ?Therapeutic Interventions: 1 to 1 sessions, Unit Group sessions and Medication administration. ? ?Evaluation of Outcomes: Not Met ? ? ?RN Treatment Plan for Primary Diagnosis: MDD (major  depressive disorder), recurrent severe, without psychosis ([place]) ?Long Term Goal(s): Knowledge of disease and therapeutic regimen to maintain health will improve ? ?Short Term Goals: Ability to remain free from injury will improve, Ability to verbalize frustration and anger appropriately will improve, Ability to demonstrate self-control, Ability to participate in decision making will improve, Ability to verbalize feelings will improve, Ability to disclose and discuss suicidal ideas, Ability to identify and develop effective coping behaviors will improve, and Compliance with prescribed medications will improve ? ?Medication Management: RN will administer medications as ordered by provider, will assess and evaluate patient's response and provide education to patient for prescribed medication. RN will report any adverse and/or side effects to prescribing provider. ? ?Therapeutic Interventions: 1 on 1 counseling sessions, Psychoeducation, Medication administration, Evaluate responses to treatment, Monitor vital signs and CBGs as ordered, Perform/monitor CIWA, COWS, AIMS and Fall Risk screenings as ordered, Perform wound care treatments as ordered. ? ?Evaluation of Outcomes: Not Met ? ? ?LCSW Treatment Plan for Primary Diagnosis: MDD (major depressive disorder), recurrent severe, without psychosis ([place]) ?Long Term Goal(s): Safe transition to appropriate next level of care at discharge, Engage patient in therapeutic group addressing interpersonal concerns. ? ?Short Term Goals: Engage patient in aftercare planning with referrals and resources, Increase social support, Increase ability to appropriately verbalize feelings, Increase emotional regulation, Facilitate acceptance of mental health diagnosis and concerns, Facilitate patient progression through stages of change regarding substance use diagnoses and concerns, Identify triggers associated with mental health/substance abuse issues, and Increase skills for wellness and  recovery ? ?Therapeutic Interventions: Assess for all discharge needs, 1 to 1 time with Education officer, museum, Explore available resources and support systems, Assess for adequacy in community support network, Educate family and significant other(s) on suicide prevention, Complete Psychosocial Assessment, Interpersonal group therapy. ? ?Evaluation of Outcomes: Not Met ? ? ?Progress in Treatment: ?Attending groups: No. ?Participating in groups: No. ?Taking medication as prescribed: Yes. ?Toleration medication: Yes. ?Family/Significant other contact made: No, will contact:  when given permission ?Patient understands diagnosis: Yes. ?Discussing patient identified problems/goals with staff: Yes. ?Medical problems stabilized or resolved: Yes. ?Denies suicidal/homicidal ideation: Yes. ?Issues/concerns per patient self-inventory: No. ?Other: None ? ?New problem(s) identified: No, Describe:  None ? ?New Short Term/Long Term Goal(s): Patient to work towards detox, medication management for mood stabilization; elimination of SI thoughts; development of comprehensive mental wellness/sobriety plan. ? ? ?Patient Goals:  "I want to go to rehab" and  "finding a new place to live" ? ?Discharge Plan or Barriers: CSW will assist pt with development of appropriate follow up/aftercare plan.  ? ?Reason for Continuation of Hospitalization: Depression ?Medication stabilization ?Suicidal ideation ? ?Estimated Length of Stay: TBD ? ? ?Scribe for Treatment Team: ?Elyanah Farino A [place], [place] ?03/03/2022 ?10:41 AM ?
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
## 2022-03-03 NOTE — BHH Counselor (Signed)
CSW contacted [facility] regarding recovery services. Staff member stated that they cannot accept pt's that have Medicare as their insurance and will not be able to accommodate applicant. CSW will continue to contact other facilities for admission referrals.  ? ?Vir Whetstine [place], MSW, LCSW-A ?4/5/20232:47 PM  ?

## 2022-03-03 NOTE — BHH Counselor (Signed)
CSW made first attempt to complete PSA with pt. CSW was unable to complete PSA. CSW will follow up at a more opportune time to finish clinical assessment.  ? ?Ritik Stavola [place], MSW, LCSW-A ?4/5/20234:34 PM  ?

## 2022-03-03 NOTE — H&P (Signed)
Psychiatric Admission Assessment Adult ? ?Patient Identification: Lisa Hawkins ?MRN:  [id number] ?Date of Evaluation:  03/03/2022 ?Chief Complaint:  MDD (major depressive disorder), recurrent severe, without psychosis ([place]) [F33.2] ?Principal Diagnosis: MDD (major depressive disorder), recurrent severe, without psychosis ([place]) ?Diagnosis:  Principal Problem: ?  MDD (major depressive disorder), recurrent severe, without psychosis ([place]) ? ?History of Present Illness: Lisa Hawkins is a 71 year old African-American female who presents to the geriatric unit on a voluntary basis after drinking and poison, bleach, and Mr. Franchot Erichsen.  She states that it was a suicide attempt and that she is a crack addict and would like to get help.  She is not getting along with her boyfriend with whom she lives over the past 50 years.  She would like to go to rehab.  She currently denies any suicidal ideation.  She does say that it was a suicide attempt.  She has had multiple suicide attempts as a teenager with overdoses and has been psychiatrically hospitalized at [facility] for auditory hallucinations and substance abuse.  She says that she used to hear voices but does not any longer.  She does feel depressed.  She is been on Paxil in the past which she states was helpful but would like to try something different.  She is currently HIV positive and has a history of hypertension.  She has a daughter who is a Engineer, structural and has a good relationship with her.  She lives locally.  She used to go to [facility] years ago but has not seen a psychiatrist since then. ? ?PER INITIAL INTAKE: ?Patient was seen in consultation on the medical floor. She ingested cleaning substances in what is reported in the ED triage notes as a suicide attempt.  On evaluation today, patient is denying that this was a suicide attempt and states that she did it because "she was high."  She states that she has used illicit substances, mostly cocaine, starting in the early 1980s.  She  states that she quit for a while, and then started using heroin when and crack cocaine in the 1990s. ?Patient states, "I am 71 years old, I do not need to be doing this silliness, using drugs," she states that she has not been in rehab before but is interested in stopping the use of all illicit substances.  She states that she is spending $900 a month on crack cocaine.  Patient reports that she was on Paxil for about a year but has recently stopped. Feels it may have been helpful.  She does report that sometimes she has auditory or visual hallucinations even when she is not using crack cocaine. ? ?Associated Signs/Symptoms: ?Depression Symptoms:  anhedonia, ?Duration of Depression Symptoms: No data recorded ?(Hypo) Manic Symptoms:  Hallucinations, ?Anxiety Symptoms:  Social Anxiety, ?Psychotic Symptoms:  Paranoia, ?PTSD Symptoms: ?NA ?Total Time spent with patient: 1 hour ? ?Past Psychiatric History: As above, see HPI ? ?Is the patient at risk to self? No.  ?Has the patient been a risk to self in the past 6 months? Yes  ?Has the patient been a risk to self within the distant past? Yes ?Is the patient a risk to others? No.  ?Has the patient been a risk to others in the past 6 months? No.  ?Has the patient been a risk to others within the distant past? No.  ? ?Prior Inpatient Therapy:   ?Prior Outpatient Therapy:   ? ?Alcohol Screening: 1. How often do you have a drink containing alcohol?: Never ?2.  How many drinks containing alcohol do you have on a typical day when you are drinking?: 1 or 2 ?3. How often do you have six or more drinks on one occasion?: Never ?AUDIT-C Score: 0 ?4. How often during the last year have you found that you were not able to stop drinking once you had started?: Never ?5. How often during the last year have you failed to do what was normally expected from you because of drinking?: Never ?6. How often during the last year have you needed a first drink in the morning to get yourself going  after a heavy drinking session?: Never ?7. How often during the last year have you had a feeling of guilt of remorse after drinking?: Never ?8. How often during the last year have you been unable to remember what happened the night before because you had been drinking?: Never ?9. Have you or someone else been injured as a result of your drinking?: No ?10. Has a relative or friend or a doctor or another health worker been concerned about your drinking or suggested you cut down?: No ?Alcohol Use Disorder Identification Test Final Score (AUDIT): 0 ?Substance Abuse History in the last 12 months:  Yes.   ?Consequences of Substance Abuse: ?Family Consequences:  Conflict ?Previous Psychotropic Medications: Yes  ?Psychological Evaluations: No  ?Past Medical History:  ?Past Medical History:  ?Diagnosis Date  ? Anxiety   ? Arthritis   ? Bronchitis   ? Chronic bilateral low back pain with bilateral sciatica 05/19/2020  ? Complication of anesthesia   ? vomitting  ? Dysrhythmia   ? hx of flutter, no longer present  ? Elevated lipids   ? GERD (gastroesophageal reflux disease)   ? does not take meds but has gerd pretty bad  ? Hepatitis   ? Hep C - treated with Harvoni  ? HIV (human immunodeficiency virus infection) ([place])   ? HOH (hard of hearing)   ? Hypertension   ? Myocardial infarction [facility]) 1993  ? Neuropathy   ? Seizures ([place])   ? not considered seizures but does "black out" and unsure of what happened. happens when she overheats. last episode 4 days ago.  ? Stroke [facility])   ? no defecits  ?  ?Past Surgical History:  ?Procedure Laterality Date  ? ABDOMINAL HYSTERECTOMY  1970  ? BACK SURGERY    ? no metal  ? CATARACT EXTRACTION W/PHACO Right 06/21/2017  ? Procedure: CATARACT EXTRACTION PHACO AND INTRAOCULAR LENS PLACEMENT (IOC);  Surgeon: Birder Robson, MD;  Location: [facility] ORS;  Service: Ophthalmology;  Laterality: Right;  [place] 00:30.9 ?AP% 12.7 ?CDE 3.94 ?Fluid Pack Lot # [id number]  ? CATARACT EXTRACTION W/PHACO Left 07/12/2017   ? Procedure: CATARACT EXTRACTION PHACO AND INTRAOCULAR LENS PLACEMENT (IOC);  Surgeon: Birder Robson, MD;  Location: [facility] ORS;  Service: Ophthalmology;  Laterality: Left;  [place]  00:42 ?AP% 15.2 ?CDE 6.38 ?Fluid pack lot # [id number]  ? COLONOSCOPY WITH PROPOFOL N/A 12/09/2020  ? Procedure: COLONOSCOPY WITH PROPOFOL;  Surgeon: Jonathon Bellows, MD;  Location: [facility] ENDOSCOPY;  Service: Gastroenterology;  Laterality: N/A;  ? DIAGNOSTIC LAPAROSCOPY    ? FRACTURE SURGERY Left   ? wrist  ? HERNIA REPAIR  1740  ? umbilical  ? JOINT REPLACEMENT Bilateral   ? KNEE CLOSED REDUCTION Left 12/30/2016  ? Procedure: CLOSED MANIPULATION KNEE;  Surgeon: Hessie Knows, MD;  Location: [facility] ORS;  Service: Orthopedics;  Laterality: Left;  ? LAPAROTOMY    ? TONSILLECTOMY    ?  TOTAL KNEE ARTHROPLASTY Right 07/15/2016  ? Procedure: TOTAL KNEE ARTHROPLASTY;  Surgeon: Hessie Knows, MD;  Location: [facility] ORS;  Service: Orthopedics;  Laterality: Right;  ? TOTAL KNEE ARTHROPLASTY Left 10/26/2016  ? Procedure: TOTAL KNEE ARTHROPLASTY;  Surgeon: Hessie Knows, MD;  Location: [facility] ORS;  Service: Orthopedics;  Laterality: Left;  ? ?Family History:  ?Family History  ?Problem Relation Age of Onset  ? Hypertension Father   ? ?Family Psychiatric  History: No ?Tobacco Screening:   ?Social History:  ?Social History  ? ?Substance and Sexual Activity  ?Alcohol Use No  ?   ?Social History  ? ?Substance and Sexual Activity  ?Drug Use No  ?  ?Additional Social History: ?  ?   ?  ?  ?  ?  ?  ?  ?  ?  ?  ?  ? ?Allergies:   ?Allergies  ?Allergen Reactions  ? Penicillins Swelling and Rash  ?  Has patient had a PCN reaction causing immediate rash, facial/tongue/throat swelling, SOB or lightheadedness with hypotension: Yes ?Has patient had a PCN reaction causing severe rash involving mucus membranes or skin necrosis: No ?Has patient had a PCN reaction that required hospitalization Yes ?Has patient had a PCN reaction occurring within the last 10 years: No ?If all of the above  answers are "NO", then may proceed with Cephalosporin use. ?  ? ?Lab Results:  ?Results for orders placed or performed during the hospital encounter of 02/28/22 (from the past 48 hour(s))  ?HIV Antibody (r

## 2022-03-04 ENCOUNTER — Encounter: Payer: Self-pay | Admitting: Psychiatry

## 2022-03-04 DIAGNOSIS — F332 Major depressive disorder, recurrent severe without psychotic features: Secondary | ICD-10-CM | POA: Diagnosis not present

## 2022-03-04 LAB — HIV-1/HIV-2 QUALITATIVE RNA
HIV-1 RNA, Qualitative: NONREACTIVE
HIV-2 RNA, Qualitative: NONREACTIVE

## 2022-03-04 LAB — HIV-1/2 AB - DIFFERENTIATION
HIV 1 Ab: UNDETERMINED
HIV 2 Ab: NONREACTIVE

## 2022-03-04 MED ORDER — PANTOPRAZOLE SODIUM 40 MG PO TBEC
60.0000 mg | DELAYED_RELEASE_TABLET | Freq: Every day | ORAL | Status: DC
  Administered 2022-03-05 – 2022-03-11 (×7): 60 mg via ORAL
  Filled 2022-03-04 (×7): qty 1

## 2022-03-04 MED ORDER — ABACAVIR-DOLUTEGRAVIR-LAMIVUD 600-50-300 MG PO TABS
1.0000 | ORAL_TABLET | Freq: Every day | ORAL | Status: DC
  Administered 2022-03-04 – 2022-03-11 (×8): 1 via ORAL
  Filled 2022-03-04 (×8): qty 1

## 2022-03-04 MED ORDER — AMLODIPINE BESYLATE 5 MG PO TABS
10.0000 mg | ORAL_TABLET | Freq: Every day | ORAL | Status: DC
  Administered 2022-03-04 – 2022-03-11 (×8): 10 mg via ORAL
  Filled 2022-03-04 (×8): qty 2

## 2022-03-04 MED ORDER — NAPROXEN 500 MG PO TABS
500.0000 mg | ORAL_TABLET | Freq: Two times a day (BID) | ORAL | Status: DC | PRN
  Filled 2022-03-04: qty 1

## 2022-03-04 MED ORDER — CELECOXIB 100 MG PO CAPS
100.0000 mg | ORAL_CAPSULE | Freq: Two times a day (BID) | ORAL | Status: DC
  Administered 2022-03-04 – 2022-03-11 (×14): 100 mg via ORAL
  Filled 2022-03-04 (×15): qty 1

## 2022-03-04 MED ORDER — PANTOPRAZOLE SODIUM 20 MG PO TBEC
20.0000 mg | DELAYED_RELEASE_TABLET | Freq: Once | ORAL | Status: AC
  Administered 2022-03-04: 20 mg via ORAL
  Filled 2022-03-04: qty 1

## 2022-03-04 NOTE — [facility] Group Notes (Signed)
[facility] Group Notes:  (Nursing/MHT/Case Management/Adjunct) ? ?Date:  03/03/2022 ?Time:  10:00 AM ? ?Type of Therapy:  Psychoeducational Skills ? ?Participation Level:  Active ? ?Participation Quality:  Appropriate ? ?Affect:  Appropriate ? ?Cognitive:  Appropriate ? ?Insight:  None ? ?Engagement in Group:  Engaged ? ?Modes of Intervention:  Discussion ? ?Summary of Progress/Problems: ?The pt attended group and participated in the activity, as well as shared during discussion. ?Lisa Hawkins ?03/04/2022, 11:56 AM ?

## 2022-03-04 NOTE — Progress Notes (Signed)
Recreation Therapy Notes ? ?INPATIENT RECREATION TR PLAN ? ?Patient Details ?Name: Lisa Hawkins ?MRN: [id number] ?DOB: 05-13-1951 ?Today's Date: 03/04/2022 ? ?Rec Therapy Plan ?Is patient appropriate for Therapeutic Recreation?: Yes ?Treatment times per week: at least 3 ?Estimated Length of Stay: 5-7 days ?TR Treatment/Interventions: Group participation (Comment) ? ?Discharge Criteria ?Pt will be discharged from therapy if:: Discharged ?Treatment plan/goals/alternatives discussed and agreed upon by:: Patient/family ? ?Discharge Summary ?  ? ? ?[name] ?03/04/2022, 1:43 PM ?

## 2022-03-04 NOTE — Progress Notes (Signed)
[facility] MD Progress Note ? ?03/04/2022 11:25 AM ?Lisa Hawkins  ?MRN:  [id number] ?Subjective: Lisa Hawkins is seen on rounds.  She says that her husband brought in her medications.  I took a look at them and they were in formulary so I ordered her HIV medications and her blood pressure medicine along with her Celebrex.  She says that she slept well.  So far, she likes the med changes and is starting to feel a little bit better.  She is able to contract for safety in the hospital.  She denies any side effects from her medicines. ? ?Principal Problem: MDD (major depressive disorder), recurrent severe, without psychosis ([place]) ?Diagnosis: Principal Problem: ?  MDD (major depressive disorder), recurrent severe, without psychosis ([place]) ? ?Total Time spent with patient: 15 minutes ? ?Past Psychiatric History: She has had multiple suicide attempts as a teenager with overdoses and has been psychiatrically hospitalized at [facility] for auditory hallucinations and substance abuse.  She says that she used to hear voices but does not any longer.  ? ?Past Medical History:  ?Past Medical History:  ?Diagnosis Date  ? Anxiety   ? Arthritis   ? Bronchitis   ? Chronic bilateral low back pain with bilateral sciatica 05/19/2020  ? Complication of anesthesia   ? vomitting  ? Dysrhythmia   ? hx of flutter, no longer present  ? Elevated lipids   ? GERD (gastroesophageal reflux disease)   ? does not take meds but has gerd pretty bad  ? Hepatitis   ? Hep C - treated with Harvoni  ? HIV (human immunodeficiency virus infection) ([place])   ? HOH (hard of hearing)   ? Hypertension   ? Myocardial infarction [facility]) 1993  ? Neuropathy   ? Seizures ([place])   ? not considered seizures but does "black out" and unsure of what happened. happens when she overheats. last episode 4 days ago.  ? Stroke [facility])   ? no defecits  ?  ?Past Surgical History:  ?Procedure Laterality Date  ? ABDOMINAL HYSTERECTOMY  1970  ? BACK SURGERY    ? no metal  ? CATARACT EXTRACTION W/PHACO Right  06/21/2017  ? Procedure: CATARACT EXTRACTION PHACO AND INTRAOCULAR LENS PLACEMENT (IOC);  Surgeon: Birder Robson, MD;  Location: [facility] ORS;  Service: Ophthalmology;  Laterality: Right;  [place] 00:30.9 ?AP% 12.7 ?CDE 3.94 ?Fluid Pack Lot # [id number]  ? CATARACT EXTRACTION W/PHACO Left 07/12/2017  ? Procedure: CATARACT EXTRACTION PHACO AND INTRAOCULAR LENS PLACEMENT (IOC);  Surgeon: Birder Robson, MD;  Location: [facility] ORS;  Service: Ophthalmology;  Laterality: Left;  [place]  00:42 ?AP% 15.2 ?CDE 6.38 ?Fluid pack lot # [id number]  ? COLONOSCOPY WITH PROPOFOL N/A 12/09/2020  ? Procedure: COLONOSCOPY WITH PROPOFOL;  Surgeon: Jonathon Bellows, MD;  Location[facility] ENDOSCOPY;  Service: Gastroenterology;  Laterality: N/A;  ? DIAGNOSTIC LAPAROSCOPY    ? FRACTURE SURGERY Left   ? wrist  ? HERNIA REPAIR  0093  ? umbilical  ? JOINT REPLACEMENT Bilateral   ? KNEE CLOSED REDUCTION Left 12/30/2016  ? Procedure: CLOSED MANIPULATION KNEE;  Surgeon: Hessie Knows, MD;  Location: [facility] ORS;  Service: Orthopedics;  Laterality: Left;  ? LAPAROTOMY    ? TONSILLECTOMY    ? TOTAL KNEE ARTHROPLASTY Right 07/15/2016  ? Procedure: TOTAL KNEE ARTHROPLASTY;  Surgeon: Hessie Knows, MD;  Location: [facility] ORS;  Service: Orthopedics;  Laterality: Right;  ? TOTAL KNEE ARTHROPLASTY Left 10/26/2016  ? Procedure: TOTAL KNEE ARTHROPLASTY;  Surgeon: Hessie Knows, MD;  Location: [facility] ORS;  Service: Orthopedics;  Laterality: Left;  ? ?Family History:  ?Family History  ?Problem Relation Age of Onset  ? Hypertension Father   ? ? ?Social History:  ?Social History  ? ?Substance and Sexual Activity  ?Alcohol Use No  ?   ?Social History  ? ?Substance and Sexual Activity  ?Drug Use No  ?  ?Social History  ? ?Socioeconomic History  ? Marital status: Widowed  ?  Spouse name: Not on file  ? Number of children: Not on file  ? Years of education: Not on file  ? Highest education level: Not on file  ?Occupational History  ? Not on file  ?Tobacco Use  ? Smoking status: Every Day  ?   Packs/day: 0.50  ?  Years: 45.00  ?  Pack years: 22.50  ?  Types: Cigarettes  ? Smokeless tobacco: Never  ?Vaping Use  ? Vaping Use: Never used  ?Substance and Sexual Activity  ? Alcohol use: No  ? Drug use: No  ? Sexual activity: Yes  ?Other Topics Concern  ? Not on file  ?Social History Narrative  ? Not on file  ? ?Social Determinants of Health  ? ?Financial Resource Strain: Not on file  ?Food Insecurity: Not on file  ?Transportation Needs: Not on file  ?Physical Activity: Not on file  ?Stress: Not on file  ?Social Connections: Not on file  ? ?Additional Social History:  ?  ?  ?  ?  ?  ?  ?  ?  ?  ?  ?  ? ?Sleep: Good ? ?Appetite:  Good ? ?Current Medications: ?Current Facility-Administered Medications  ?Medication Dose Route Frequency Provider Last Rate Last Admin  ? abacavir-dolutegravir-lamiVUDine (TRIUMEQ) 600-50-300 MG per tablet 1 tablet  1 tablet Oral Daily Parks Ranger, DO      ? acetaminophen (TYLENOL) tablet 650 mg  650 mg Oral Q6H PRN Parks Ranger, DO      ? alum & mag hydroxide-simeth (MAALOX/MYLANTA) 200-200-20 MG/5ML suspension 30 mL  30 mL Oral Q4H PRN Parks Ranger, DO      ? amitriptyline (ELAVIL) tablet 100 mg  100 mg Oral QHS Parks Ranger, DO   100 mg at 03/03/22 2138  ? amLODipine (NORVASC) tablet 10 mg  10 mg Oral Daily Parks Ranger, DO      ? aspirin EC tablet 81 mg  81 mg Oral Daily Parks Ranger, DO   81 mg at 03/04/22 6301  ? atorvastatin (LIPITOR) tablet 40 mg  40 mg Oral QHS Parks Ranger, DO   40 mg at 03/03/22 2138  ? celecoxib (CELEBREX) capsule 100 mg  100 mg Oral BID Parks Ranger, DO      ? diphenoxylate-atropine (LOMOTIL) 2.5-0.025 MG per tablet 1 tablet  1 tablet Oral QID PRN Parks Ranger, DO      ? FLUoxetine (PROZAC) capsule 20 mg  20 mg Oral Daily Parks Ranger, DO   20 mg at 03/04/22 0946  ? hydrochlorothiazide (HYDRODIURIL) tablet 25 mg  25 mg Oral QHS Parks Ranger, DO   25 mg at 03/03/22 2139  ? LORazepam (ATIVAN) injection 2 mg  2 mg Intravenous Q4H PRN Parks Ranger, DO      ? LORazepam (ATIVAN) tablet 1 mg  1 mg Oral TID PRN Parks Ranger, DO      ? magnesium hydroxide (MILK OF MAGNESIA) suspension 30 mL  30 mL Oral Daily PRN Parks Ranger, DO      ?  naproxen (NAPROSYN) tablet 500 mg  500 mg Oral BID PRN Parks Ranger, DO      ? nicotine (NICODERM CQ - dosed in mg/24 hours) patch 21 mg  21 mg Transdermal Daily Parks Ranger, DO   21 mg at 03/04/22 0946  ? OLANZapine (ZYPREXA) tablet 10 mg  10 mg Oral Q6H PRN Parks Ranger, DO      ? ondansetron [facility]) tablet 4 mg  4 mg Oral Q6H PRN Parks Ranger, DO      ? Or  ? ondansetron (ZOFRAN) injection 4 mg  4 mg Intravenous Q6H PRN Parks Ranger, DO      ? pantoprazole (PROTONIX) EC tablet 40 mg  40 mg Oral Daily Parks Ranger, DO   40 mg at 03/04/22 0946  ? risperiDONE (RISPERDAL) tablet 0.5 mg  0.5 mg Oral [id number] Parks Ranger, DO   0.5 mg at 03/04/22 4010  ? traZODone (DESYREL) tablet 100 mg  100 mg Oral QHS PRN Parks Ranger, DO      ? ? ?Lab Results:  ?Results for orders placed or performed during the hospital encounter of 02/28/22 (from the past 48 hour(s))  ?Resp Panel by RT-PCR (Flu A&B, Covid) Nasopharyngeal Swab     Status: None  ? Collection Time: 03/02/22  2:27 PM  ? Specimen: Nasopharyngeal Swab; Nasopharyngeal(NP) swabs in vial transport medium  ?Result Value Ref Range  ? SARS Coronavirus 2 by RT PCR NEGATIVE NEGATIVE  ?  Comment: (NOTE) ?SARS-CoV-2 target nucleic acids are NOT DETECTED. ? ?The SARS-CoV-2 RNA is generally detectable in upper respiratory ?specimens during the acute phase of infection. The lowest ?concentration of SARS-CoV-2 viral copies this assay can detect is ?138 copies/mL. A negative result does not preclude SARS-Cov-2 ?infection and should not be used as the sole basis for  treatment or ?other patient management decisions. A negative result may occur with  ?improper specimen collection/handling, submission of specimen other ?than nasopharyngeal swab, presence of viral mutation(s) wit

## 2022-03-04 NOTE — Plan of Care (Signed)
Patient remain AOX4, calm and cooperative during assessment. Denies SI, HI, and AVH. Denies anxiety and depression. Denies pain or discomfort at this time. Report sleeping good last night. Ate meals in the day room with good appetite and tolerated well. Compliant with all due medication. Remain safe on the unit with Q15 minute safety check. ? ?Problem: Education: ?Goal: Knowledge of [place] General Education information/materials will improve ?Outcome: Progressing ?Goal: Mental status will improve ?Outcome: Progressing ?  ?Problem: Coping: ?Goal: Ability to demonstrate self-control will improve ?Outcome: Progressing ?  ?Problem: Safety: ?Goal: Periods of time without injury will increase ?Outcome: Progressing ?  ?
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
## 2022-03-04 NOTE — Group Note (Signed)
[address] LCSW Group Therapy Note ? ? ?Group Date: 03/04/2022 ?Start Time: 1415 ?End Time: 2426 ? ? ?Type of Therapy/Topic:  Group Therapy:  Emotion Regulation ? ?Participation Level:  Active  ? ? ? ?Description of Group:   ? The purpose of this group is to assist patients in learning to regulate negative emotions and experience positive emotions. Patients will be guided to discuss ways in which they have been vulnerable to their negative emotions. These vulnerabilities will be juxtaposed with experiences of positive emotions or situations, and patients challenged to use positive emotions to combat negative ones. Special emphasis will be placed on coping with negative emotions in conflict situations, and patients will process healthy conflict resolution skills. ? ?Therapeutic Goals: ?Patient will identify two positive emotions or experiences to reflect on in order to balance out negative emotions:  ?Patient will label two or more emotions that they find the most difficult to experience:  ?Patient will be able to demonstrate positive conflict resolution skills through discussion or role plays:  ? ?Summary of Patient Progress: ? ? ?Patient was present for the entirety of the group session. Patient was an active listener, had appropriate mood, and participated in the topic of discussion, provided helpful advice to others, and added nuance to topic of conversation. CSW led patients through emotional regulation activity  to facilitate discussion and illicit thoughts and feelings regarding topic. She stated that she was grateful to be alive and for her family and she also said as a way to relax she "loves to look at clouds" on her porch.  ? ? ? ? ?Therapeutic Modalities:   ?Cognitive Behavioral Therapy ?Feelings Identification ?Dialectical Behavioral Therapy ? ? ?Kaylob Wallen A [place], LCSWA ?

## 2022-03-04 NOTE — Progress Notes (Signed)
Recreation Therapy Notes ? ?Date: 03/04/2022 ? ?Time: 10:00 AM ?  ?Location: Day room   ? ?Behavioral response: N/A ?  ?Intervention Topic: Animal Assisted therapy   ? ?Discussion/Intervention: ?Patient refused to attend group. ?  ?Clinical Observations/Feedback:  ?Patient refused to attend group. ?  ?Amil Bouwman LRT/CTRS ? ? ? ? ? ? ? ?Signe Tackitt ?03/04/2022 12:47 PM ?

## 2022-03-04 NOTE — Plan of Care (Signed)
Patient Cooperative and Tearful during shift.  Patient verbalized SA as reason for admission.  Patient states she did not want to live. Patient currently denies SI/HI/Avh and contracts for safety. Patient did verbalize Depression and Anxiety at times.  Emotional support and encouragement given.  Pt did state I have God as a healthy coping skill she will utilize.  Med compliant and denies any reactions.  Q 15 minute safety checks in place.   ? ?Problem: Education: ?Goal: Knowledge of [place] General Education information/materials will improve ?Outcome: Progressing ?Goal: Mental status will improve ?Outcome: Progressing ?  ?Problem: Coping: ?Goal: Ability to demonstrate self-control will improve ?Outcome: Progressing ?  ?Problem: Safety: ?Goal: Periods of time without injury will increase ?Outcome: Progressing ?  ?

## 2022-03-04 NOTE — [facility] Suicide Risk Assessment (Signed)
[facility] INPATIENT:  Family/Significant Other Suicide Prevention Education ? ?Suicide Prevention Education:  ?Contact Attempts: Lisa Hawkins, partner (name of family member/significant other) has been identified by the patient as the family member/significant other with whom the patient will be residing, and identified as the person(s) who will aid the patient in the event of a mental health crisis.  With written consent from the patient, two attempts were made to provide suicide prevention education, prior to and/or following the patient's discharge.  We were unsuccessful in providing suicide prevention education.  A suicide education pamphlet was given to the patient to share with family/significant other. ? ?Date and time of first attempt:03/04/22/ 4:17pm  ?Date and time of second attempt: ? ?Lisa Hawkins ?03/04/2022, 4:17 PM ?

## 2022-03-04 NOTE — Progress Notes (Signed)
Recreation Therapy Notes ? ?INPATIENT RECREATION THERAPY ASSESSMENT ? ?Patient Details ?Name: Lisa Hawkins ?MRN: [id number] ?DOB: 1951/02/02 ?Today's Date: 03/04/2022 ?      ?Information Obtained From: ?Patient ? ?Able to Participate in Assessment/Interview: ?Yes ? ?Patient Presentation: ?Responsive ? ?Reason for Admission (Per Patient): ?Active Symptoms, Suicidal Ideation, Suicide Attempt ? ?Patient Stressors: ?Other (Comment) (Drug use) ? ?Coping Skills:   ?Substance Abuse, Other (Comment) (Sleep,Eat) ? ?Leisure Interests (2+):  ?Music - Listen (Sit outside) ? ?Frequency of Recreation/Participation: ?Weekly ? ?Awareness of Community Resources:  ?Yes ? ?Community Resources:  ?Church ? ?Current Use: ?No ? ?If no, Barriers?: ?Attitudinal ? ?Expressed Interest in [name] Information: ?Yes ? ?[place] of Residence:  ?[place] ? ?Patient Main Form of Transportation: ?Other (Comment) (Friends) ? ?Patient Strengths:  ?Loving,Caring ? ?Patient Identified Areas of Improvement:  ?Stay clean ? ?Patient Goal for Hospitalization:  ?Get off the drugs ? ?Current SI (including self-harm):  ?No ? ?Current HI:  ?No ? ?Current AVH: ?No ? ?Staff Intervention Plan: ?Group Attendance, Collaborate with Interdisciplinary Treatment Team ? ?Consent to Intern Participation: ?N/A ? ?Lisa Hawkins ?03/04/2022, 1:42 PM ?

## 2022-03-05 DIAGNOSIS — F332 Major depressive disorder, recurrent severe without psychotic features: Secondary | ICD-10-CM | POA: Diagnosis not present

## 2022-03-05 NOTE — Progress Notes (Signed)
[facility] MD Progress Note ? ?03/05/2022 12:00 PM ?Lisa Hawkins  ?MRN:  [id number] ?Subjective: Lisa Hawkins continues to improve.  She states that she is feeling better and is glad that she did not follow through with suicide.  She is pleasant and cooperative with staff and peers.  She is taking her medications as prescribed and denies any side effects.  Her pulse is high so I would stop her hydrochlorothiazide for now.  Her blood pressure is normal.  Sleep and appetite are good. ? ?Principal Problem: MDD (major depressive disorder), recurrent severe, without psychosis ([place]) ?Diagnosis: Principal Problem: ?  MDD (major depressive disorder), recurrent severe, without psychosis ([place]) ? ?Total Time spent with patient: 15 minutes ? ?Past Psychiatric History:  She has had multiple suicide attempts as a teenager with overdoses and has been psychiatrically hospitalized at [facility] for auditory hallucinations and substance abuse.  She says that she used to hear voices but does not any longer.  ? ?Past Medical History:  ?Past Medical History:  ?Diagnosis Date  ? Anxiety   ? Arthritis   ? Bronchitis   ? Chronic bilateral low back pain with bilateral sciatica 05/19/2020  ? Complication of anesthesia   ? vomitting  ? Dysrhythmia   ? hx of flutter, no longer present  ? Elevated lipids   ? GERD (gastroesophageal reflux disease)   ? does not take meds but has gerd pretty bad  ? Hepatitis   ? Hep C - treated with [facility]  ? HIV (human immunodeficiency virus infection) ([place])   ? HOH (hard of hearing)   ? Hypertension   ? Myocardial infarction [facility]) 1993  ? Neuropathy   ? Seizures ([place])   ? not considered seizures but does "black out" and unsure of what happened. happens when she overheats. last episode 4 days ago.  ? Stroke [facility])   ? no defecits  ?  ?Past Surgical History:  ?Procedure Laterality Date  ? ABDOMINAL HYSTERECTOMY  1970  ? BACK SURGERY    ? no metal  ? CATARACT EXTRACTION W/PHACO Right 06/21/2017  ? Procedure: CATARACT EXTRACTION  PHACO AND INTRAOCULAR LENS PLACEMENT (IOC);  Surgeon: Birder Robson, MD;  Location: [facility] ORS;  Service: Ophthalmology;  Laterality: Right;  [place] 00:30.9 ?AP% 12.7 ?CDE 3.94 ?Fluid Pack Lot # [id number]  ? CATARACT EXTRACTION W/PHACO Left 07/12/2017  ? Procedure: CATARACT EXTRACTION PHACO AND INTRAOCULAR LENS PLACEMENT (IOC);  Surgeon: Birder Robson, MD;  Location: [facility] ORS;  Service: Ophthalmology;  Laterality: Left;  [place]  00:42 ?AP% 15.2 ?CDE 6.38 ?Fluid pack lot # [id number]  ? COLONOSCOPY WITH PROPOFOL N/A 12/09/2020  ? Procedure: COLONOSCOPY WITH PROPOFOL;  Surgeon: Jonathon Bellows, MD;  Location: [facility] ENDOSCOPY;  Service: Gastroenterology;  Laterality: N/A;  ? DIAGNOSTIC LAPAROSCOPY    ? FRACTURE SURGERY Left   ? wrist  ? HERNIA REPAIR  6063  ? umbilical  ? JOINT REPLACEMENT Bilateral   ? KNEE CLOSED REDUCTION Left 12/30/2016  ? Procedure: CLOSED MANIPULATION KNEE;  Surgeon: Hessie Knows, MD;  Location: [facility] ORS;  Service: Orthopedics;  Laterality: Left;  ? LAPAROTOMY    ? TONSILLECTOMY    ? TOTAL KNEE ARTHROPLASTY Right 07/15/2016  ? Procedure: TOTAL KNEE ARTHROPLASTY;  Surgeon: Hessie Knows, MD;  Location: [facility] ORS;  Service: Orthopedics;  Laterality: Right;  ? TOTAL KNEE ARTHROPLASTY Left 10/26/2016  ? Procedure: TOTAL KNEE ARTHROPLASTY;  Surgeon: Hessie Knows, MD;  Location: [facility] ORS;  Service: Orthopedics;  Laterality: Left;  ? ?Family History:  ?Family History  ?  Problem Relation Age of Onset  ? Hypertension Father   ? ? ?Social History:  ?Social History  ? ?Substance and Sexual Activity  ?Alcohol Use No  ?   ?Social History  ? ?Substance and Sexual Activity  ?Drug Use No  ?  ?Social History  ? ?Socioeconomic History  ? Marital status: Widowed  ?  Spouse name: Not on file  ? Number of children: Not on file  ? Years of education: Not on file  ? Highest education level: Not on file  ?Occupational History  ? Not on file  ?Tobacco Use  ? Smoking status: Every Day  ?  Packs/day: 0.50  ?  Years: 45.00  ?  Pack years:  22.50  ?  Types: Cigarettes  ? Smokeless tobacco: Never  ?Vaping Use  ? Vaping Use: Never used  ?Substance and Sexual Activity  ? Alcohol use: No  ? Drug use: No  ? Sexual activity: Yes  ?Other Topics Concern  ? Not on file  ?Social History Narrative  ? Not on file  ? ?Social Determinants of Health  ? ?Financial Resource Strain: Not on file  ?Food Insecurity: Not on file  ?Transportation Needs: Not on file  ?Physical Activity: Not on file  ?Stress: Not on file  ?Social Connections: Not on file  ? ?Additional Social History:  ?  ?  ?  ?  ?  ?  ?  ?  ?  ?  ?  ? ?Sleep: Good ? ?Appetite:  Good ? ?Current Medications: ?Current Facility-Administered Medications  ?Medication Dose Route Frequency Provider Last Rate Last Admin  ? abacavir-dolutegravir-lamiVUDine (TRIUMEQ) 600-50-300 MG per tablet 1 tablet  1 tablet Oral Daily Parks Ranger, DO   1 tablet at 03/05/22 1003  ? acetaminophen (TYLENOL) tablet 650 mg  650 mg Oral Q6H PRN Parks Ranger, DO      ? alum & mag hydroxide-simeth (MAALOX/MYLANTA) 200-200-20 MG/5ML suspension 30 mL  30 mL Oral Q4H PRN Parks Ranger, DO      ? amitriptyline (ELAVIL) tablet 100 mg  100 mg Oral QHS Parks Ranger, DO   100 mg at 03/04/22 2159  ? amLODipine (NORVASC) tablet 10 mg  10 mg Oral Daily Parks Ranger, DO   10 mg at 03/05/22 1003  ? aspirin EC tablet 81 mg  81 mg Oral Daily Parks Ranger, DO   81 mg at 03/05/22 1003  ? atorvastatin (LIPITOR) tablet 40 mg  40 mg Oral QHS Parks Ranger, DO   40 mg at 03/04/22 2159  ? celecoxib (CELEBREX) capsule 100 mg  100 mg Oral BID Parks Ranger, DO   100 mg at 03/05/22 1003  ? diphenoxylate-atropine (LOMOTIL) 2.5-0.025 MG per tablet 1 tablet  1 tablet Oral QID PRN Parks Ranger, DO      ? FLUoxetine (PROZAC) capsule 20 mg  20 mg Oral Daily Parks Ranger, DO   20 mg at 03/05/22 1003  ? hydrochlorothiazide (HYDRODIURIL) tablet 25 mg  25 mg Oral QHS  Parks Ranger, DO   25 mg at 03/04/22 2159  ? LORazepam (ATIVAN) injection 2 mg  2 mg Intravenous Q4H PRN Parks Ranger, DO      ? LORazepam (ATIVAN) tablet 1 mg  1 mg Oral TID PRN Parks Ranger, DO      ? magnesium hydroxide (MILK OF MAGNESIA) suspension 30 mL  30 mL Oral Daily PRN Parks Ranger, DO      ?  naproxen (NAPROSYN) tablet 500 mg  500 mg Oral BID PRN Parks Ranger, DO      ? nicotine (NICODERM CQ - dosed in mg/24 hours) patch 21 mg  21 mg Transdermal Daily Parks Ranger, DO   21 mg at 03/05/22 1010  ? OLANZapine (ZYPREXA) tablet 10 mg  10 mg Oral Q6H PRN Parks Ranger, DO      ? ondansetron [facility]) tablet 4 mg  4 mg Oral Q6H PRN Parks Ranger, DO      ? Or  ? ondansetron (ZOFRAN) injection 4 mg  4 mg Intravenous Q6H PRN Parks Ranger, DO      ? pantoprazole (PROTONIX) EC tablet 60 mg  60 mg Oral Daily Parks Ranger, DO   60 mg at 03/05/22 1003  ? risperiDONE (RISPERDAL) tablet 0.5 mg  0.5 mg Oral BH-[id number] Parks Ranger, DO   0.5 mg at 03/05/22 2841  ? traZODone (DESYREL) tablet 100 mg  100 mg Oral QHS PRN Parks Ranger, DO      ? ? ?Lab Results: No results found for this or any previous visit (from the past 48 hour(s)). ? ?Blood Alcohol level:  ?Lab Results  ?Component Value Date  ? ETH <10 02/28/2022  ? ? ?Metabolic Disorder Labs: ?No results found for: HGBA1C, MPG ?No results found for: PROLACTIN ?No results found for: CHOL, TRIG, HDL, CHOLHDL, VLDL, LDLCALC ? ?Physical Findings: ?AIMS:  , ,  ,  ,    ?CIWA:    ?COWS:    ? ?Musculoskeletal: ?Strength & Muscle Tone: within normal limits ?Gait & Station: normal ?Patient leans: N/A ? ?Psychiatric Specialty Exam: ? ?Presentation  ?General Appearance: Appropriate for Environment ? ?Eye Contact:Good ? ?Speech:Clear and Coherent ? ?Speech Volume:Normal ? ?Handedness:No data recorded ? ?Mood and Affect   ?Mood:Depressed ? ?Affect:Appropriate ? ? ?Thought Process  ?Thought Processes:Coherent ? ?Descriptions of Associations:Intact ? ?Orientation:Full (Time, Place and Person) ? ?Thought Content:Logical ? ?History of Schizophrenia/Schizoaffective diso

## 2022-03-05 NOTE — Progress Notes (Signed)
Recreation Therapy Notes ? ?Date: 03/05/2022 ? ?Time: 1:25pm   ? ?Location: Day room    ? ?Behavioral response: Appropriate ? ?Intervention Topic: Social-Skills   ? ?Discussion/Intervention:  ?Group content on today was focused on social skills. The group defined social skills and identified ways they use social skills. Patients expressed what obstacles they face when trying to be social. Participants described the importance of social skills. The group listed ways to improve social skills and reasons to improve social skills. Individuals had an opportunity to learn new and improve social skills as well as identify their weaknesses. ?Clinical Observations/Feedback: ?Patient came to group and identified many ways to socialize with others. Participant shared experiences they have had in the past when socializing with others. Individual was social with peers and staff while participating in the intervention.    ?[name] LRT/CTRS  ? ? ? ? ? ? ? ?Lisa Hawkins ?03/05/2022 3:16 PM ?

## 2022-03-05 NOTE — Plan of Care (Signed)
Patient Calm and Cooperative during shift.  Patient denies SI/HI/Avh and contracts for safety.  "I am glad God didn't take me!" Patient denies Anxiety and Depression.  Patient did comply with scheduled medications.  Participates in therapeutic milieu and interacts with peers. Patient did eat night snack. Patient did verbalize she learned to have more positive relationships to avoid  feeling low and having suicidal thoughts.  Q 15 minute safety rounding in place.  Informed patient to notify nursing staff if needs arise.   ? ? ?Problem: Education: ?Goal: Knowledge of [place] General Education information/materials will improve ?Outcome: Progressing ?Goal: Mental status will improve ?Outcome: Progressing ?  ?Problem: Coping: ?Goal: Ability to demonstrate self-control will improve ?Outcome: Progressing ?  ?Problem: Safety: ?Goal: Periods of time without injury will increase ?Outcome: Progressing ?  ?

## 2022-03-05 NOTE — B[facility] Counselor (Signed)
CSW contacted the following places for residential treatment for pt.  ? ?[facility]- staff stated to contact on Monday, did not know availability ? ?[facility]- no answer, will follow up at a more opportune time.  ? ?CSW will continue to contact [facility] inpatient facilities for availability.  ? ?Wealthy Danielski [place], MSW, LCSW-A ?4/7/202312:19 PM  ? ? ?

## 2022-03-05 NOTE — Plan of Care (Signed)
Patient remain alert and oriented, calm and cooperative during assessment. Denies pain or discomfort. Denies SI, HI, and AVH. Denies anxiety and depression. Ate meals in the day room among staff and peers and tolerated well. Compliant with all due medications. Currently in the day room watching TV in no apparent distress. Q15 minute safety checks in place.  ? ?Problem: Education: ?Goal: Knowledge of [place] General Education information/materials will improve ?Outcome: Progressing ?Goal: Mental status will improve ?Outcome: Progressing ?  ?Problem: Coping: ?Goal: Ability to demonstrate self-control will improve ?Outcome: Progressing ?  ?Problem: Safety: ?Goal: Periods of time without injury will increase ?Outcome: Progressing ?  ?

## 2022-03-05 NOTE — Plan of Care (Signed)
Patient Calm and Cooperative during shift.  Patient denies SI/HI/Avh and contracts for safety.  Patient denies Anxiety and Depression.  Patient did participate in therapeutic milieu.  Patient stated that Lisa Hawkins is excited about participating in the rehabilitation program in [place].  Patient did have night snack and staff continues to encourage fluids.   ? ? ?Problem: Education: ?Goal: Knowledge of [place] General Education information/materials will improve ?Outcome: Progressing ?Goal: Mental status will improve ?Outcome: Progressing ?  ?Problem: Coping: ?Goal: Ability to demonstrate self-control will improve ?Outcome: Progressing ?  ?

## 2022-03-06 DIAGNOSIS — F332 Major depressive disorder, recurrent severe without psychotic features: Secondary | ICD-10-CM | POA: Diagnosis not present

## 2022-03-06 MED ORDER — HYDROXYZINE HCL 25 MG PO TABS: 25.0000 mg | ORAL_TABLET | ORAL | Status: DC | PRN

## 2022-03-06 NOTE — Progress Notes (Signed)
[facility] MD Progress Note ? ?03/06/2022 10:36 AM ?Lisa Hawkins  ?MRN:  [id number] ?Subjective: Lisa Hawkins states that she is feeling better.  She is sleeping well and her appetite is good.  She denies any side effects from her medication.  Her affect is improved.  She states that she is doing better. ? ?Principal Problem: MDD (major depressive disorder), recurrent severe, without psychosis ([place]) ?Diagnosis: Principal Problem: ?  MDD (major depressive disorder), recurrent severe, without psychosis ([place]) ? ?Total Time spent with patient: 15 minutes ? ?Past Psychiatric History:  She has had multiple suicide attempts as a teenager with overdoses and has been psychiatrically hospitalized at [facility] for auditory hallucinations and substance abuse.  She says that she used to hear voices but does not any longer.  ? ?Past Medical History:  ?Past Medical History:  ?Diagnosis Date  ? Anxiety   ? Arthritis   ? Bronchitis   ? Chronic bilateral low back pain with bilateral sciatica 05/19/2020  ? Complication of anesthesia   ? vomitting  ? Dysrhythmia   ? hx of flutter, no longer present  ? Elevated lipids   ? GERD (gastroesophageal reflux disease)   ? does not take meds but has gerd pretty bad  ? Hepatitis   ? Hep C - treated with Harvoni  ? HIV (human immunodeficiency virus infection) ([place])   ? HOH (hard of hearing)   ? Hypertension   ? Myocardial infarction [facility]) 1993  ? Neuropathy   ? Seizures ([place])   ? not considered seizures but does "black out" and unsure of what happened. happens when she overheats. last episode 4 days ago.  ? Stroke [facility])   ? no defecits  ?  ?Past Surgical History:  ?Procedure Laterality Date  ? ABDOMINAL HYSTERECTOMY  1970  ? BACK SURGERY    ? no metal  ? CATARACT EXTRACTION W/PHACO Right 06/21/2017  ? Procedure: CATARACT EXTRACTION PHACO AND INTRAOCULAR LENS PLACEMENT (IOC);  Surgeon: Birder Robson, MD;  Location: [facility] ORS;  Service: Ophthalmology;  Laterality: Right;  [place] 00:30.9 ?AP% 12.7 ?CDE 3.94 ?Fluid  Pack Lot # [id number]  ? CATARACT EXTRACTION W/PHACO Left 07/12/2017  ? Procedure: CATARACT EXTRACTION PHACO AND INTRAOCULAR LENS PLACEMENT (IOC);  Surgeon: Birder Robson, MD;  Location: [facility] ORS;  Service: Ophthalmology;  Laterality: Left;  [place]  00:42 ?AP% 15.2 ?CDE 6.38 ?Fluid pack lot # [id number]  ? COLONOSCOPY WITH PROPOFOL N/A 12/09/2020  ? Procedure: COLONOSCOPY WITH PROPOFOL;  Surgeon: Jonathon Bellows, MD;  Location: [facility] ENDOSCOPY;  Service: Gastroenterology;  Laterality: N/A;  ? DIAGNOSTIC LAPAROSCOPY    ? FRACTURE SURGERY Left   ? wrist  ? HERNIA REPAIR  1324  ? umbilical  ? JOINT REPLACEMENT Bilateral   ? KNEE CLOSED REDUCTION Left 12/30/2016  ? Procedure: CLOSED MANIPULATION KNEE;  Surgeon: Hessie Knows, MD;  Location: [facility] ORS;  Service: Orthopedics;  Laterality: Left;  ? LAPAROTOMY    ? TONSILLECTOMY    ? TOTAL KNEE ARTHROPLASTY Right 07/15/2016  ? Procedure: TOTAL KNEE ARTHROPLASTY;  Surgeon: Hessie Knows, MD;  Location: [facility] ORS;  Service: Orthopedics;  Laterality: Right;  ? TOTAL KNEE ARTHROPLASTY Left 10/26/2016  ? Procedure: TOTAL KNEE ARTHROPLASTY;  Surgeon: Hessie Knows, MD;  Location: [facility] ORS;  Service: Orthopedics;  Laterality: Left;  ? ?Family History:  ?Family History  ?Problem Relation Age of Onset  ? Hypertension Father   ? ? ?Social History:  ?Social History  ? ?Substance and Sexual Activity  ?Alcohol Use No  ?   ?  Social History  ? ?Substance and Sexual Activity  ?Drug Use No  ?  ?Social History  ? ?Socioeconomic History  ? Marital status: Widowed  ?  Spouse name: Not on file  ? Number of children: Not on file  ? Years of education: Not on file  ? Highest education level: Not on file  ?Occupational History  ? Not on file  ?Tobacco Use  ? Smoking status: Every Day  ?  Packs/day: 0.50  ?  Years: 45.00  ?  Pack years: 22.50  ?  Types: Cigarettes  ? Smokeless tobacco: Never  ?Vaping Use  ? Vaping Use: Never used  ?Substance and Sexual Activity  ? Alcohol use: No  ? Drug use: No  ? Sexual activity:  Yes  ?Other Topics Concern  ? Not on file  ?Social History Narrative  ? Not on file  ? ?Social Determinants of Health  ? ?Financial Resource Strain: Not on file  ?Food Insecurity: Not on file  ?Transportation Needs: Not on file  ?Physical Activity: Not on file  ?Stress: Not on file  ?Social Connections: Not on file  ? ?Additional Social History:  ?  ?  ?  ?  ?  ?  ?  ?  ?  ?  ?  ? ?Sleep: Good ? ?Appetite:  Good ? ?Current Medications: ?Current Facility-Administered Medications  ?Medication Dose Route Frequency Provider Last Rate Last Admin  ? abacavir-dolutegravir-lamiVUDine (TRIUMEQ) 600-50-300 MG per tablet 1 tablet  1 tablet Oral Daily Parks Ranger, DO   1 tablet at 03/06/22 1950  ? acetaminophen (TYLENOL) tablet 650 mg  650 mg Oral Q6H PRN Parks Ranger, DO      ? alum & mag hydroxide-simeth (MAALOX/MYLANTA) 200-200-20 MG/5ML suspension 30 mL  30 mL Oral Q4H PRN Parks Ranger, DO      ? amitriptyline (ELAVIL) tablet 100 mg  100 mg Oral QHS Parks Ranger, DO   100 mg at 03/05/22 2129  ? amLODipine (NORVASC) tablet 10 mg  10 mg Oral Daily Parks Ranger, DO   10 mg at 03/06/22 9326  ? aspirin EC tablet 81 mg  81 mg Oral Daily Parks Ranger, DO   81 mg at 03/06/22 7124  ? atorvastatin (LIPITOR) tablet 40 mg  40 mg Oral QHS Parks Ranger, DO   40 mg at 03/05/22 2129  ? celecoxib (CELEBREX) capsule 100 mg  100 mg Oral BID Parks Ranger, DO   100 mg at 03/06/22 5809  ? diphenoxylate-atropine (LOMOTIL) 2.5-0.025 MG per tablet 1 tablet  1 tablet Oral QID PRN Parks Ranger, DO      ? FLUoxetine (PROZAC) capsule 20 mg  20 mg Oral Daily Parks Ranger, DO   20 mg at 03/06/22 9833  ? hydrOXYzine (ATARAX) tablet 25 mg  25 mg Oral Q4H PRN Parks Ranger, DO      ? LORazepam (ATIVAN) injection 2 mg  2 mg Intravenous Q4H PRN Parks Ranger, DO      ? LORazepam (ATIVAN) tablet 1 mg  1 mg Oral TID PRN Parks Ranger, DO      ? magnesium hydroxide (MILK OF MAGNESIA) suspension 30 mL  30 mL Oral Daily PRN Parks Ranger, DO      ? naproxen (NAPROSYN) tablet 500 mg  500 mg Oral BID PRN Parks Ranger, DO      ? nicotine (NICODERM CQ - dosed in mg/24 hours) patch 21 mg  21 mg Transdermal Daily Parks Ranger, DO   21 mg at 03/06/22 6644  ? OLANZapine (ZYPREXA) tablet 10 mg  10 mg Oral Q6H PRN Parks Ranger, DO      ? ondansetron [facility]) tablet 4 mg  4 mg Oral Q6H PRN Parks Ranger, DO      ? Or  ? ondansetron (ZOFRAN) injection 4 mg  4 mg Intravenous Q6H PRN Parks Ranger, DO      ? pantoprazole (PROTONIX) EC tablet 60 mg  60 mg Oral Daily Parks Ranger, DO   60 mg at 03/06/22 0347  ? risperiDONE (RISPERDAL) tablet 0.5 mg  0.5 mg Oral BH-q8a4p Parks Ranger, DO   0.5 mg at 03/06/22 4259  ? traZODone (DESYREL) tablet 100 mg  100 mg Oral QHS PRN Parks Ranger, DO      ? ? ?Lab Results: No results found for this or any previous visit (from the past 48 hour(s)). ? ?Blood Alcohol level:  ?Lab Results  ?Component Value Date  ? ETH <10 02/28/2022  ? ? ?Metabolic Disorder Labs: ?No results found for: HGBA1C, MPG ?No results found for: PROLACTIN ?No results found for: CHOL, TRIG, HDL, CHOLHDL, VLDL, LDLCALC ? ?Physical Findings: ?AIMS:  , ,  ,  ,    ?CIWA:    ?COWS:    ? ?Musculoskeletal: ?Strength & Muscle Tone: within normal limits ?Gait & Station: normal ?Patient leans: N/A ? ?Psychiatric Specialty Exam: ? ?Presentation  ?General Appearance: Appropriate for Environment ? ?Eye Contact:Good ? ?Speech:Clear and Coherent ? ?Speech Volume:Normal ? ?Handedness:No data recorded ? ?Mood and Affect  ?Mood:Depressed ? ?Affect:Appropriate ? ? ?Thought Process  ?Thought Processes:Coherent ? ?Descriptions of Associations:Intact ? ?Orientation:Full (Time, Place and Person) ? ?Thought Content:Logical ? ?History of Schizophrenia/Schizoaffective  disorder:No data recorded ?Duration of Psychotic Symptoms:No data recorded ?Hallucinations:No data recorded ?Ideas of Reference:None ? ?Suicidal Thoughts:No data recorded ?Homicidal Thoughts:No data recorded ? ?Sens

## 2022-03-06 NOTE — [facility] Suicide Risk Assessment (Signed)
[facility] INPATIENT:  Family/Significant Other Suicide Prevention Education ? ?Suicide Prevention Education:  ?Education Completed; Silverio Lay (Friend)  ?[phone], has been identified by the patient as the family member/significant other with whom the patient will be residing, and identified as the person(s) who will aid the patient in the event of a mental health crisis (suicidal ideations/suicide attempt).  With written consent from the patient, the family member/significant other has been provided the following suicide prevention education, prior to the and/or following the discharge of the patient. ? ?The suicide prevention education provided includes the following: ?Suicide risk factors ?Suicide prevention and interventions ?National Suicide Hotline telephone number ?[facility] assessment telephone number ?[facility] Emergency Assistance 911 ?[place] and/or Residential Mobile Crisis Unit telephone number ? ?Request made of family/significant other to: ?Remove weapons (e.g., guns, rifles, knives), all items previously/currently identified as safety concern.   ?Remove drugs/medications (over-the-counter, prescriptions, illicit drugs), all items previously/currently identified as a safety concern. ? ?The family member/significant other verbalizes understanding of the suicide prevention education information provided.  The family member/significant other agrees to remove the items of safety concern listed above. ? ?Berniece Salines ?03/06/2022, 11:20 AM ?

## 2022-03-06 NOTE — Progress Notes (Signed)
Patient remains alert and oriented, calm, pleasant and cooperative on this shift, Denies pain or discomfort. Denies SI, HI, and AVH, anxiety and depression. Ate breakfast and lunch in the day room among staff and peers. Compliant with all medications. Currently in the day room watching TV in no apparent distress. Q15 minute safety checks in place.  ?

## 2022-03-06 NOTE — Group Note (Signed)
LCSW Group Therapy Note ? ?Group Date: 03/06/2022 ?Start Time: 1300 ?End Time: 1400 ? ? ?Type of Therapy and Topic:  Group Therapy - Healthy vs Unhealthy Coping Skills ? ?Participation Level:  Did Not Attend  ? ?Description of Group ?The focus of this group was to determine what unhealthy coping techniques typically are used by group members and what healthy coping techniques would be helpful in coping with various problems. Patients were guided in becoming aware of the differences between healthy and unhealthy coping techniques. Patients were asked to identify 2-3 healthy coping skills they would like to learn to use more effectively. ? ?Therapeutic Goals ?Patients learned that coping is what human beings do all day long to deal with various situations in their lives ?Patients defined and discussed healthy vs unhealthy coping techniques ?Patients identified their preferred coping techniques and identified whether these were healthy or unhealthy ?Patients determined 2-3 healthy coping skills they would like to become more familiar with and use more often. ?Patients provided support and ideas to each other ? ? ?Summary of Patient Progress: Due to limited staffing, group was not held on the unit.  ? ?Therapeutic Modalities ?Cognitive Behavioral Therapy ?Motivational Interviewing ? ?Kenna Gilbert [place], LCSWA ?03/06/2022  3:59 PM   ?

## 2022-03-06 NOTE — [facility] Suicide Risk Assessment (Incomplete)
[facility] INPATIENT:  Family/Significant Other Suicide Prevention Education ? ?Suicide Prevention Education:  ?{SUICIDE PREVENTION EDUCATION:22251} ?

## 2022-03-07 DIAGNOSIS — F332 Major depressive disorder, recurrent severe without psychotic features: Secondary | ICD-10-CM | POA: Diagnosis not present

## 2022-03-07 NOTE — Progress Notes (Signed)
[facility] MD Progress Note ? ?03/07/2022 10:54 AM ?Algis Hawkins  ?MRN:  [id number] ?Subjective: Lisa Hawkins says that she is doing better.  She is taking her medications as prescribed and denies any side effects.  She says that her upper left arm hurts a little bit but there is nothing there.  She likes the medication changes. ? ?Principal Problem: MDD (major depressive disorder), recurrent severe, without psychosis ([place]) ?Diagnosis: Principal Problem: ?  MDD (major depressive disorder), recurrent severe, without psychosis ([place]) ? ?Total Time spent with patient: 15 minutes ? ?Past Psychiatric History: She has had multiple suicide attempts as a teenager with overdoses and has been psychiatrically hospitalized at [facility] for auditory hallucinations and substance abuse.  She says that she used to hear voices but does not any longer.  ? ?Past Medical History:  ?Past Medical History:  ?Diagnosis Date  ? Anxiety   ? Arthritis   ? Bronchitis   ? Chronic bilateral low back pain with bilateral sciatica 05/19/2020  ? Complication of anesthesia   ? vomitting  ? Dysrhythmia   ? hx of flutter, no longer present  ? Elevated lipids   ? GERD (gastroesophageal reflux disease)   ? does not take meds but has gerd pretty bad  ? Hepatitis   ? Hep C - treated with Harvoni  ? HIV (human immunodeficiency virus infection) ([place])   ? HOH (hard of hearing)   ? Hypertension   ? Myocardial infarction [facility]) 1993  ? Neuropathy   ? Seizures ([place])   ? not considered seizures but does "black out" and unsure of what happened. happens when she overheats. last episode 4 days ago.  ? Stroke [facility])   ? no defecits  ?  ?Past Surgical History:  ?Procedure Laterality Date  ? ABDOMINAL HYSTERECTOMY  1970  ? BACK SURGERY    ? no metal  ? CATARACT EXTRACTION W/PHACO Right 06/21/2017  ? Procedure: CATARACT EXTRACTION PHACO AND INTRAOCULAR LENS PLACEMENT (IOC);  Surgeon: Birder Robson, MD;  Location: [facility] ORS;  Service: Ophthalmology;  Laterality: Right;  [place] 00:30.9 ?AP%  12.7 ?CDE 3.94 ?Fluid Pack Lot # [id number]  ? CATARACT EXTRACTION W/PHACO Left 07/12/2017  ? Procedure: CATARACT EXTRACTION PHACO AND INTRAOCULAR LENS PLACEMENT (IOC);  Surgeon: Birder Robson, MD;  Location: [facility] ORS;  Service: Ophthalmology;  Laterality: Left;  [place]  00:42 ?AP% 15.2 ?CDE 6.38 ?Fluid pack lot # [id number]  ? COLONOSCOPY WITH PROPOFOL N/A 12/09/2020  ? Procedure: COLONOSCOPY WITH PROPOFOL;  Surgeon: Jonathon Bellows, MD;  Location: [facility] ENDOSCOPY;  Service: Gastroenterology;  Laterality: N/A;  ? DIAGNOSTIC LAPAROSCOPY    ? FRACTURE SURGERY Left   ? wrist  ? HERNIA REPAIR  1093  ? umbilical  ? JOINT REPLACEMENT Bilateral   ? KNEE CLOSED REDUCTION Left 12/30/2016  ? Procedure: CLOSED MANIPULATION KNEE;  Surgeon: Hessie Knows, MD;  Location: [facility] ORS;  Service: Orthopedics;  Laterality: Left;  ? LAPAROTOMY    ? TONSILLECTOMY    ? TOTAL KNEE ARTHROPLASTY Right 07/15/2016  ? Procedure: TOTAL KNEE ARTHROPLASTY;  Surgeon: Hessie Knows, MD;  Location: [facility] ORS;  Service: Orthopedics;  Laterality: Right;  ? TOTAL KNEE ARTHROPLASTY Left 10/26/2016  ? Procedure: TOTAL KNEE ARTHROPLASTY;  Surgeon: Hessie Knows, MD;  Location: [facility] ORS;  Service: Orthopedics;  Laterality: Left;  ? ?Family History:  ?Family History  ?Problem Relation Age of Onset  ? Hypertension Father   ? ?Social History:  ?Social History  ? ?Substance and Sexual Activity  ?Alcohol Use No  ?   ?  Social History  ? ?Substance and Sexual Activity  ?Drug Use No  ?  ?Social History  ? ?Socioeconomic History  ? Marital status: Widowed  ?  Spouse name: Not on file  ? Number of children: Not on file  ? Years of education: Not on file  ? Highest education level: Not on file  ?Occupational History  ? Not on file  ?Tobacco Use  ? Smoking status: Every Day  ?  Packs/day: 0.50  ?  Years: 45.00  ?  Pack years: 22.50  ?  Types: Cigarettes  ? Smokeless tobacco: Never  ?Vaping Use  ? Vaping Use: Never used  ?Substance and Sexual Activity  ? Alcohol use: No  ? Drug use: No  ?  Sexual activity: Yes  ?Other Topics Concern  ? Not on file  ?Social History Narrative  ? Not on file  ? ?Social Determinants of Health  ? ?Financial Resource Strain: Not on file  ?Food Insecurity: Not on file  ?Transportation Needs: Not on file  ?Physical Activity: Not on file  ?Stress: Not on file  ?Social Connections: Not on file  ? ?Additional Social History:  ?  ?  ?  ?  ?  ?  ?  ?  ?  ?  ?  ? ?Sleep: Good ? ?Appetite:  Good ? ?Current Medications: ?Current Facility-Administered Medications  ?Medication Dose Route Frequency Provider Last Rate Last Admin  ? abacavir-dolutegravir-lamiVUDine (TRIUMEQ) 086-76-195 MG per tablet 1 tablet  1 tablet Oral Daily Parks Ranger, DO   1 tablet at 03/07/22 0941  ? acetaminophen (TYLENOL) tablet 650 mg  650 mg Oral Q6H PRN Parks Ranger, DO      ? alum & mag hydroxide-simeth (MAALOX/MYLANTA) 200-200-20 MG/5ML suspension 30 mL  30 mL Oral Q4H PRN Parks Ranger, DO      ? amitriptyline (ELAVIL) tablet 100 mg  100 mg Oral QHS Parks Ranger, DO   100 mg at 03/06/22 2051  ? amLODipine (NORVASC) tablet 10 mg  10 mg Oral Daily Parks Ranger, DO   10 mg at 03/07/22 0935  ? aspirin EC tablet 81 mg  81 mg Oral Daily Parks Ranger, DO   81 mg at 03/07/22 0935  ? atorvastatin (LIPITOR) tablet 40 mg  40 mg Oral QHS Parks Ranger, DO   40 mg at 03/06/22 2050  ? celecoxib (CELEBREX) capsule 100 mg  100 mg Oral BID Parks Ranger, DO   100 mg at 03/07/22 0941  ? diphenoxylate-atropine (LOMOTIL) 2.5-0.025 MG per tablet 1 tablet  1 tablet Oral QID PRN Parks Ranger, DO      ? FLUoxetine (PROZAC) capsule 20 mg  20 mg Oral Daily Parks Ranger, DO   20 mg at 03/07/22 0935  ? hydrOXYzine (ATARAX) tablet 25 mg  25 mg Oral Q4H PRN Parks Ranger, DO      ? LORazepam (ATIVAN) injection 2 mg  2 mg Intravenous Q4H PRN Parks Ranger, DO      ? LORazepam (ATIVAN) tablet 1 mg  1 mg Oral TID  PRN Parks Ranger, DO      ? magnesium hydroxide (MILK OF MAGNESIA) suspension 30 mL  30 mL Oral Daily PRN Parks Ranger, DO      ? naproxen (NAPROSYN) tablet 500 mg  500 mg Oral BID PRN Parks Ranger, DO      ? nicotine (NICODERM CQ - dosed in mg/24 hours) patch 21 mg  21 mg Transdermal Daily Parks Ranger, DO   21 mg at 03/07/22 6440  ? OLANZapine (ZYPREXA) tablet 10 mg  10 mg Oral Q6H PRN Parks Ranger, DO      ? ondansetron [facility]) tablet 4 mg  4 mg Oral Q6H PRN Parks Ranger, DO      ? Or  ? ondansetron (ZOFRAN) injection 4 mg  4 mg Intravenous Q6H PRN Parks Ranger, DO      ? pantoprazole (PROTONIX) EC tablet 60 mg  60 mg Oral Daily Parks Ranger, DO   60 mg at 03/07/22 0935  ? risperiDONE (RISPERDAL) tablet 0.5 mg  0.5 mg Oral BH-q8a4p Parks Ranger, DO   0.5 mg at 03/07/22 3474  ? traZODone (DESYREL) tablet 100 mg  100 mg Oral QHS PRN Parks Ranger, DO      ? ? ?Lab Results: No results found for this or any previous visit (from the past 48 hour(s)). ? ?Blood Alcohol level:  ?Lab Results  ?Component Value Date  ? ETH <10 02/28/2022  ? ? ?Metabolic Disorder Labs: ?No results found for: HGBA1C, MPG ?No results found for: PROLACTIN ?No results found for: CHOL, TRIG, HDL, CHOLHDL, VLDL, LDLCALC ? ?Physical Findings: ?AIMS:  , ,  ,  ,    ?CIWA:    ?COWS:    ? ?Musculoskeletal: ?Strength & Muscle Tone: within normal limits ?Gait & Station: normal ?Patient leans: N/A ? ?Psychiatric Specialty Exam: ? ?Presentation  ?General Appearance: Appropriate for Environment ? ?Eye Contact:Good ? ?Speech:Clear and Coherent ? ?Speech Volume:Normal ? ?Handedness:No data recorded ? ?Mood and Affect  ?Mood:Depressed ? ?Affect:Appropriate ? ? ?Thought Process  ?Thought Processes:Coherent ? ?Descriptions of Associations:Intact ? ?Orientation:Full (Time, Place and Person) ? ?Thought Content:Logical ? ?History of  Schizophrenia/Schizoaffective disorder:No data recorded ?Duration of Psychotic Symptoms:No data recorded ?Hallucinations:No data recorded ?Ideas of Reference:None ? ?Suicidal Thoughts:No data recorded ?Homicidal Thoughts:No

## 2022-03-07 NOTE — Group Note (Addendum)
[place] LCSW Group Therapy Note ? ? ?Group Date: 03/07/2022 ?Start Time: 1310 ?End Time: 1410 ? ? ?Type of Therapy/Topic:  Group Therapy:  Emotion Regulation ? ?Participation Level:  Active  ? ? ?Description of Group:   ? The purpose of this group is to assist patients in learning to regulate negative emotions and experience positive emotions. Patients will be guided to discuss ways in which they have been vulnerable to their negative emotions. These vulnerabilities will be juxtaposed with experiences of positive emotions or situations, and patients challenged to use positive emotions to combat negative ones. Special emphasis will be placed on coping with negative emotions in conflict situations, and patients will process healthy conflict resolution skills. ? ?Therapeutic Goals: ?Patient will identify two positive emotions or experiences to reflect on in order to balance out negative emotions:  ?Patient will label two or more emotions that they find the most difficult to experience:  ?Patient will be able to demonstrate positive conflict resolution skills through discussion or role plays:  ? ?Summary of Patient Progress: Patient was present for the entirety of the group session. Patient was an active listener and participated in the topic of discussion, provided helpful advice to others, and added nuance to topic of conversation. Patient participated in the icebreaker activity where she reflected on positive emotions, including gratitude. Patient identified grief as a difficult emotion, processing how she continues to grieve the loss of her mother who passed away "a couple years ago." Patient expressed continued interest in transitioning to a halfway house in [place] after she is discharged. ? ? ? ? ? ?Therapeutic Modalities:   ?Cognitive Behavioral Therapy ?Feelings Identification ?Dialectical Behavioral Therapy ? ? ?Kenna Gilbert Eidan [place], LCSWA ?

## 2022-03-07 NOTE — Progress Notes (Signed)
The patient was interacting with her peers while they watched, movies in the day room. She came to the nurses station for soap to take a shower. The patient was compliant with her medications. She did want her window in her room shut, but the knob is not functioning. She denies, SI/HI AVH.  ? ?Lisa Hawkins ? ?

## 2022-03-07 NOTE — Progress Notes (Signed)
D: Pt alert and oriented. Pt denies experiencing any anxiety/depression at this time. Pt denies experiencing any pain at this time. Pt denies experiencing any SI/HI, or AVH at this time.    A: Scheduled medications administered to pt, per MD orders. Support and encouragement provided. Frequent verbal contact made. Routine safety checks conducted q15 minutes.   R: No adverse drug reactions noted. Pt verbally contracts for safety at this time. Pt complaint with medications. Pt interacts appropriately with others on the unit. Pt remains safe at this time. Will continue to monitor.  

## 2022-03-08 DIAGNOSIS — F332 Major depressive disorder, recurrent severe without psychotic features: Secondary | ICD-10-CM | POA: Diagnosis not present

## 2022-03-08 NOTE — Progress Notes (Signed)
[facility] MD Progress Note ? ?03/08/2022 1:59 PM ?Lisa Hawkins  ?MRN:  [id number] ?Subjective: Lisa Hawkins is seen on rounds.  She tells me that she wants to go home before rehab.  She thinks that the medications we changed her to have been helping.  She denies any depression or suicidal ideation.  I told her that she will be discharged soon after social work gets her follow-up appointment. ? ?Principal Problem: MDD (major depressive disorder), recurrent severe, without psychosis ([place]) ?Diagnosis: Principal Problem: ?  MDD (major depressive disorder), recurrent severe, without psychosis ([place]) ? ?Total Time spent with patient: 15 minutes ? ?Past Psychiatric History: She has had multiple suicide attempts as a teenager with overdoses and has been psychiatrically hospitalized at [facility] for auditory hallucinations and substance abuse.  She says that she used to hear voices but does not any longer ? ?Past Medical History:  ?Past Medical History:  ?Diagnosis Date  ? Anxiety   ? Arthritis   ? Bronchitis   ? Chronic bilateral low back pain with bilateral sciatica 05/19/2020  ? Complication of anesthesia   ? vomitting  ? Dysrhythmia   ? hx of flutter, no longer present  ? Elevated lipids   ? GERD (gastroesophageal reflux disease)   ? does not take meds but has gerd pretty bad  ? Hepatitis   ? Hep C - treated with Harvoni  ? HIV (human immunodeficiency virus infection) ([place])   ? HOH (hard of hearing)   ? Hypertension   ? Myocardial infarction [facility]) 1993  ? Neuropathy   ? Seizures ([place])   ? not considered seizures but does "black out" and unsure of what happened. happens when she overheats. last episode 4 days ago.  ? Stroke [facility])   ? no defecits  ?  ?Past Surgical History:  ?Procedure Laterality Date  ? ABDOMINAL HYSTERECTOMY  1970  ? BACK SURGERY    ? no metal  ? CATARACT EXTRACTION W/PHACO Right 06/21/2017  ? Procedure: CATARACT EXTRACTION PHACO AND INTRAOCULAR LENS PLACEMENT (IOC);  Surgeon: Birder Robson, MD;  Location: [facility] ORS;   Service: Ophthalmology;  Laterality: Right;  [place] 00:30.9 ?AP% 12.7 ?CDE 3.94 ?Fluid Pack Lot # [id number]  ? CATARACT EXTRACTION W/PHACO Left 07/12/2017  ? Procedure: CATARACT EXTRACTION PHACO AND INTRAOCULAR LENS PLACEMENT (IOC);  Surgeon: Birder Robson, MD;  Location: [facility] ORS;  Service: Ophthalmology;  Laterality: Left;  [place]  00:42 ?AP% 15.2 ?CDE 6.38 ?Fluid pack lot # [id number]  ? COLONOSCOPY WITH PROPOFOL N/A 12/09/2020  ? Procedure: COLONOSCOPY WITH PROPOFOL;  Surgeon: Jonathon Bellows, MD;  Location: [facility] ENDOSCOPY;  Service: Gastroenterology;  Laterality: N/A;  ? DIAGNOSTIC LAPAROSCOPY    ? FRACTURE SURGERY Left   ? wrist  ? HERNIA REPAIR  4270  ? umbilical  ? JOINT REPLACEMENT Bilateral   ? KNEE CLOSED REDUCTION Left 12/30/2016  ? Procedure: CLOSED MANIPULATION KNEE;  Surgeon: Hessie Knows, MD;  Location: [facility] ORS;  Service: Orthopedics;  Laterality: Left;  ? LAPAROTOMY    ? TONSILLECTOMY    ? TOTAL KNEE ARTHROPLASTY Right 07/15/2016  ? Procedure: TOTAL KNEE ARTHROPLASTY;  Surgeon: Hessie Knows, MD;  Location: [facility] ORS;  Service: Orthopedics;  Laterality: Right;  ? TOTAL KNEE ARTHROPLASTY Left 10/26/2016  ? Procedure: TOTAL KNEE ARTHROPLASTY;  Surgeon: Hessie Knows, MD;  Location: [facility] ORS;  Service: Orthopedics;  Laterality: Left;  ? ?Family History:  ?Family History  ?Problem Relation Age of Onset  ? Hypertension Father   ? ? ?Social History:  ?Social History  ? ?  Substance and Sexual Activity  ?Alcohol Use No  ?   ?Social History  ? ?Substance and Sexual Activity  ?Drug Use No  ?  ?Social History  ? ?Socioeconomic History  ? Marital status: Widowed  ?  Spouse name: Not on file  ? Number of children: Not on file  ? Years of education: Not on file  ? Highest education level: Not on file  ?Occupational History  ? Not on file  ?Tobacco Use  ? Smoking status: Every Day  ?  Packs/day: 0.50  ?  Years: 45.00  ?  Pack years: 22.50  ?  Types: Cigarettes  ? Smokeless tobacco: Never  ?Vaping Use  ? Vaping Use: Never used   ?Substance and Sexual Activity  ? Alcohol use: No  ? Drug use: No  ? Sexual activity: Yes  ?Other Topics Concern  ? Not on file  ?Social History Narrative  ? Not on file  ? ?Social Determinants of Health  ? ?Financial Resource Strain: Not on file  ?Food Insecurity: Not on file  ?Transportation Needs: Not on file  ?Physical Activity: Not on file  ?Stress: Not on file  ?Social Connections: Not on file  ? ?Additional Social History:  ?  ?  ?  ?  ?  ?  ?  ?  ?  ?  ?  ? ?Sleep: Good ? ?Appetite:  Good ? ?Current Medications: ?Current Facility-Administered Medications  ?Medication Dose Route Frequency Provider Last Rate Last Admin  ? abacavir-dolutegravir-lamiVUDine (TRIUMEQ) 527-78-242 MG per tablet 1 tablet  1 tablet Oral Daily Parks Ranger, DO   1 tablet at 03/08/22 3536  ? acetaminophen (TYLENOL) tablet 650 mg  650 mg Oral Q6H PRN Parks Ranger, DO   650 mg at 03/08/22 1257  ? alum & mag hydroxide-simeth (MAALOX/MYLANTA) 200-200-20 MG/5ML suspension 30 mL  30 mL Oral Q4H PRN Parks Ranger, DO      ? amitriptyline (ELAVIL) tablet 100 mg  100 mg Oral QHS Parks Ranger, DO   100 mg at 03/07/22 2119  ? amLODipine (NORVASC) tablet 10 mg  10 mg Oral Daily Parks Ranger, DO   10 mg at 03/08/22 1443  ? aspirin EC tablet 81 mg  81 mg Oral Daily Parks Ranger, DO   81 mg at 03/08/22 1540  ? atorvastatin (LIPITOR) tablet 40 mg  40 mg Oral QHS Parks Ranger, DO   40 mg at 03/07/22 2119  ? celecoxib (CELEBREX) capsule 100 mg  100 mg Oral BID Parks Ranger, DO   100 mg at 03/08/22 0867  ? diphenoxylate-atropine (LOMOTIL) 2.5-0.025 MG per tablet 1 tablet  1 tablet Oral QID PRN Parks Ranger, DO      ? FLUoxetine (PROZAC) capsule 20 mg  20 mg Oral Daily Parks Ranger, DO   20 mg at 03/08/22 6195  ? hydrOXYzine (ATARAX) tablet 25 mg  25 mg Oral Q4H PRN Parks Ranger, DO      ? LORazepam (ATIVAN) injection 2 mg  2 mg  Intravenous Q4H PRN Parks Ranger, DO      ? LORazepam (ATIVAN) tablet 1 mg  1 mg Oral TID PRN Parks Ranger, DO      ? magnesium hydroxide (MILK OF MAGNESIA) suspension 30 mL  30 mL Oral Daily PRN Parks Ranger, DO      ? naproxen (NAPROSYN) tablet 500 mg  500 mg Oral BID PRN Parks Ranger, DO      ?  nicotine (NICODERM CQ - dosed in mg/24 hours) patch 21 mg  21 mg Transdermal Daily Parks Ranger, DO   21 mg at 03/08/22 1000  ? OLANZapine (ZYPREXA) tablet 10 mg  10 mg Oral Q6H PRN Parks Ranger, DO      ? ondansetron [facility]) tablet 4 mg  4 mg Oral Q6H PRN Parks Ranger, DO      ? Or  ? ondansetron (ZOFRAN) injection 4 mg  4 mg Intravenous Q6H PRN Parks Ranger, DO      ? pantoprazole (PROTONIX) EC tablet 60 mg  60 mg Oral Daily Parks Ranger, DO   60 mg at 03/08/22 0913  ? risperiDONE (RISPERDAL) tablet 0.5 mg  0.5 mg Oral BH-q8a4p Parks Ranger, DO   0.5 mg at 03/08/22 0913  ? traZODone (DESYREL) tablet 100 mg  100 mg Oral QHS PRN Parks Ranger, DO      ? ? ?Lab Results: No results found for this or any previous visit (from the past 48 hour(s)). ? ?Blood Alcohol level:  ?Lab Results  ?Component Value Date  ? ETH <10 02/28/2022  ? ? ?Metabolic Disorder Labs: ?No results found for: HGBA1C, MPG ?No results found for: PROLACTIN ?No results found for: CHOL, TRIG, HDL, CHOLHDL, VLDL, LDLCALC ? ?Physical Findings: ?AIMS:  , ,  ,  ,    ?CIWA:    ?COWS:    ? ?Musculoskeletal: ?Strength & Muscle Tone: within normal limits ?Gait & Station: normal ?Patient leans: N/A ? ?Psychiatric Specialty Exam: ? ?Presentation  ?General Appearance: Appropriate for Environment ? ?Eye Contact:Good ? ?Speech:Clear and Coherent ? ?Speech Volume:Normal ? ?Handedness:No data recorded ? ?Mood and Affect  ?Mood:Depressed ? ?Affect:Appropriate ? ? ?Thought Process  ?Thought Processes:Coherent ? ?Descriptions of  Associations:Intact ? ?Orientation:Full (Time, Place and Person) ? ?Thought Content:Logical ? ?History of Schizophrenia/Schizoaffective disorder:No data recorded ?Duration of Psychotic Symptoms:No data recorded ?Hallucinations:No data record

## 2022-03-08 NOTE — Group Note (Signed)
[facility] LCSW Group Therapy Note ? ? ? ?Group Date: 03/08/2022 ?Start Time: 1400 ?End Time: 5009 ? ?Type of Therapy and Topic:  Group Therapy:  Overcoming Obstacles ? ?Participation Level:  [facility] PARTICIPATION LEVEL: Did Not Attend ? ?Mood: ? ?Description of Group:   ?In this group patients will be encouraged to explore what they see as obstacles to their own wellness and recovery. They will be guided to discuss their thoughts, feelings, and behaviors related to these obstacles. The group will process together ways to cope with barriers, with attention given to specific choices patients can make. Each patient will be challenged to identify changes they are motivated to make in order to overcome their obstacles. This group will be process-oriented, with patients participating in exploration of their own experiences as well as giving and receiving support and challenge from other group members. ? ?Therapeutic Goals: ?1. Patient will identify personal and current obstacles as they relate to admission. ?2. Patient will identify barriers that currently interfere with their wellness or overcoming obstacles.  ?3. Patient will identify feelings, thought process and behaviors related to these barriers. ?4. Patient will identify two changes they are willing to make to overcome these obstacles:  ? ? ?Summary of Patient Progress ? ? ?X ? ? ?Therapeutic Modalities:   ?Cognitive Behavioral Therapy ?Solution Focused Therapy ?Motivational Interviewing ?Relapse Prevention Therapy ? ? ?Armetta Henri A [place], LCSWA ?

## 2022-03-08 NOTE — Plan of Care (Signed)
°  Problem: Group Participation °Goal: STG - Patient will engage in groups without prompting or encouragement from LRT x3 group sessions within 5 recreation therapy group sessions °Description: STG - Patient will engage in groups without prompting or encouragement from LRT x3 group sessions within 5 recreation therapy group sessions °Outcome: Progressing °  °

## 2022-03-08 NOTE — Plan of Care (Signed)
Patient Calm and Cooperative during shift.  Presents Euthymic and pleasant.  Patient denies SI/HI/Avh and contracts for safety. Patient denies Depression and Anxiety. Patient verbalized a need for hydrochlrothiazide due to her feet swelling.  Patient verbalized she learned to interact with others and enjoy the outside some times". Patient did participate in therapeutic milieu.  Patient ate night snack and was given adequate fluids.  Q 15 minute safety checks in place.   ? ?Problem: Education: ?Goal: Knowledge of [place] [facility] Education information/materials will improve ?Outcome: Progressing ?Goal: Mental status will improve ?Outcome: Progressing ?  ?Problem: Coping: ?Goal: Ability to demonstrate self-control will improve ?Outcome: Progressing ?  ?

## 2022-03-08 NOTE — Plan of Care (Signed)
Patient is alert and oriented times 4. Mood and affect appropriate. Patient denies pain. She denies SI, HI, and AVH. Also denies feelings of anxiety and depression at this time. States she slept good last night. Morning meds given whole by mouth W/O difficulty. Ate breakfast in day room- appetite good. Patient remains on unit with Q15 minute checks in place.  ? ?Problem: Education: ?Goal: Knowledge of [place] [facility] Education information/materials will improve ?Outcome: Progressing ?Goal: Mental status will improve ?Outcome: Progressing ?  ?Problem: Coping: ?Goal: Ability to demonstrate self-control will improve ?Outcome: Progressing ?  ?Problem: Safety: ?Goal: Periods of time without injury will increase ?Outcome: Progressing ?  ?

## 2022-03-08 NOTE — BH IP Treatment Plan (Signed)
Interdisciplinary Treatment and Diagnostic Plan Update ? ?03/08/2022 ?Time of Session: 9:30AM ?Lisa Hawkins ?MRN: [id number] ? ?Principal Diagnosis: MDD (major depressive disorder), recurrent severe, without psychosis ([name]) ? ?Secondary Diagnoses: Principal Problem: ?  MDD (major depressive disorder), recurrent severe, without psychosis ([name]) ? ? ?Current Medications:  ?Current Facility-Administered Medications  ?Medication Dose Route Frequency Provider Last Rate Last Admin  ? abacavir-dolutegravir-lamiVUDine (TRIUMEQ) 989-21-194 MG per tablet 1 tablet  1 tablet Oral Daily Parks Ranger, DO   1 tablet at 03/08/22 1740  ? acetaminophen (TYLENOL) tablet 650 mg  650 mg Oral Q6H PRN Parks Ranger, DO      ? alum & mag hydroxide-simeth (MAALOX/MYLANTA) 200-200-20 MG/5ML suspension 30 mL  30 mL Oral Q4H PRN Parks Ranger, DO      ? amitriptyline (ELAVIL) tablet 100 mg  100 mg Oral QHS Parks Ranger, DO   100 mg at 03/07/22 2119  ? amLODipine (NORVASC) tablet 10 mg  10 mg Oral Daily Parks Ranger, DO   10 mg at 03/08/22 8144  ? aspirin EC tablet 81 mg  81 mg Oral Daily Parks Ranger, DO   81 mg at 03/08/22 8185  ? atorvastatin (LIPITOR) tablet 40 mg  40 mg Oral QHS Parks Ranger, DO   40 mg at 03/07/22 2119  ? celecoxib (CELEBREX) capsule 100 mg  100 mg Oral BID Parks Ranger, DO   100 mg at 03/08/22 6314  ? diphenoxylate-atropine (LOMOTIL) 2.5-0.025 MG per tablet 1 tablet  1 tablet Oral QID PRN Parks Ranger, DO      ? FLUoxetine (PROZAC) capsule 20 mg  20 mg Oral Daily Parks Ranger, DO   20 mg at 03/08/22 9702  ? hydrOXYzine (ATARAX) tablet 25 mg  25 mg Oral Q4H PRN Parks Ranger, DO      ? LORazepam (ATIVAN) injection 2 mg  2 mg Intravenous Q4H PRN Parks Ranger, DO      ? LORazepam (ATIVAN) tablet 1 mg  1 mg Oral TID PRN Parks Ranger, DO      ? magnesium hydroxide (MILK OF MAGNESIA)  suspension 30 mL  30 mL Oral Daily PRN Parks Ranger, DO      ? naproxen (NAPROSYN) tablet 500 mg  500 mg Oral BID PRN Parks Ranger, DO      ? nicotine (NICODERM CQ - dosed in mg/24 hours) patch 21 mg  21 mg Transdermal Daily Parks Ranger, DO   21 mg at 03/08/22 1000  ? OLANZapine (ZYPREXA) tablet 10 mg  10 mg Oral Q6H PRN Parks Ranger, DO      ? ondansetron [facility]) tablet 4 mg  4 mg Oral Q6H PRN Parks Ranger, DO      ? Or  ? ondansetron (ZOFRAN) injection 4 mg  4 mg Intravenous Q6H PRN Parks Ranger, DO      ? pantoprazole (PROTONIX) EC tablet 60 mg  60 mg Oral Daily Parks Ranger, DO   60 mg at 03/08/22 0913  ? risperiDONE (RISPERDAL) tablet 0.5 mg  0.5 mg Oral BH-q8a4p Parks Ranger, DO   0.5 mg at 03/08/22 0913  ? traZODone (DESYREL) tablet 100 mg  100 mg Oral QHS PRN Parks Ranger, DO      ? ?PTA Medications: ?Medications Prior to Admission  ?Medication Sig Dispense Refill Last Dose  ? Aspirin 81 MG CAPS Take 1 capsule by mouth daily.     ?  atorvastatin (LIPITOR) 40 MG tablet Take 40 mg by mouth at bedtime.     ? famotidine (PEPCID) 40 MG tablet Take 1 tablet by mouth as needed.     ? NICODERM CQ 21 MG/24HR patch 21 mg daily.     ? omeprazole (PRILOSEC) 20 MG capsule Take 20 mg by mouth daily.     ? ? ?Patient Stressors: Financial difficulties   ? ?Patient Strengths: Religious Affiliation  ?Supportive family/friends  ? ?Treatment Modalities: Medication Management, Group therapy, Case management,  ?1 to 1 session with clinician, Psychoeducation, Recreational therapy. ? ? ?Physician Treatment Plan for Primary Diagnosis: MDD (major depressive disorder), recurrent severe, without psychosis ([name]) ?Long Term Goal(s): Improvement in symptoms so as ready for discharge  ? ?Short Term Goals: Ability to identify changes in lifestyle to reduce recurrence of condition will improve ?Ability to verbalize feelings will  improve ?Ability to disclose and discuss suicidal ideas ?Ability to demonstrate self-control will improve ?Ability to identify and develop effective coping behaviors will improve ?Ability to maintain clinical measurements within normal limits will improve ?Compliance with prescribed medications will improve ?Ability to identify triggers associated with substance abuse/mental health issues will improve ? ?Medication Management: Evaluate patient's response, side effects, and tolerance of medication regimen. ? ?Therapeutic Interventions: 1 to 1 sessions, Unit Group sessions and Medication administration. ? ?Evaluation of Outcomes: Progressing ? ?Physician Treatment Plan for Secondary Diagnosis: Principal Problem: ?  MDD (major depressive disorder), recurrent severe, without psychosis ([name]) ? ?Long Term Goal(s): Improvement in symptoms so as ready for discharge  ? ?Short Term Goals: Ability to identify changes in lifestyle to reduce recurrence of condition will improve ?Ability to verbalize feelings will improve ?Ability to disclose and discuss suicidal ideas ?Ability to demonstrate self-control will improve ?Ability to identify and develop effective coping behaviors will improve ?Ability to maintain clinical measurements within normal limits will improve ?Compliance with prescribed medications will improve ?Ability to identify triggers associated with substance abuse/mental health issues will improve    ? ?Medication Management: Evaluate patient's response, side effects, and tolerance of medication regimen. ? ?Therapeutic Interventions: 1 to 1 sessions, Unit Group sessions and Medication administration. ? ?Evaluation of Outcomes: Progressing ? ? ?RN Treatment Plan for Primary Diagnosis: MDD (major depressive disorder), recurrent severe, without psychosis ([name]) ?Long Term Goal(s): Knowledge of disease and therapeutic regimen to maintain health will improve ? ?Short Term Goals: Ability to remain free from injury will  improve, Ability to verbalize frustration and anger appropriately will improve, Ability to demonstrate self-control, Ability to participate in decision making will improve, Ability to verbalize feelings will improve, Ability to disclose and discuss suicidal ideas, Ability to identify and develop effective coping behaviors will improve, and Compliance with prescribed medications will improve ? ?Medication Management: RN will administer medications as ordered by provider, will assess and evaluate patient's response and provide education to patient for prescribed medication. RN will report any adverse and/or side effects to prescribing provider. ? ?Therapeutic Interventions: 1 on 1 counseling sessions, Psychoeducation, Medication administration, Evaluate responses to treatment, Monitor vital signs and CBGs as ordered, Perform/monitor CIWA, COWS, AIMS and Fall Risk screenings as ordered, Perform wound care treatments as ordered. ? ?Evaluation of Outcomes: Progressing ? ? ?LCSW Treatment Plan for Primary Diagnosis: MDD (major depressive disorder), recurrent severe, without psychosis ([place]) ?Long Term Goal(s): Safe transition to appropriate next level of care at discharge, Engage patient in therapeutic group addressing interpersonal concerns. ? ?Short Term Goals: Engage patient in aftercare planning with referrals and resources, Increase  social support, Increase ability to appropriately verbalize feelings, Increase emotional regulation, Facilitate acceptance of mental health diagnosis and concerns, Facilitate patient progression through stages of change regarding substance use diagnoses and concerns, and Identify triggers associated with mental health/substance abuse issues ? ?Therapeutic Interventions: Assess for all discharge needs, 1 to 1 time with Education officer, museum, Explore available resources and support systems, Assess for adequacy in community support network, Educate family and significant other(s) on suicide  prevention, Complete Psychosocial Assessment, Interpersonal group therapy. ? ?Evaluation of Outcomes: Progressing ? ? ?Progress in Treatment: ?Attending groups: Yes. ?Participating in groups: Yes. ?Taking medication as prescribed:

## 2022-03-08 NOTE — Progress Notes (Signed)
Recreation Therapy Notes ? ?Date: 03/08/2022 ?  ?Time: 2:00 pm  ?  ?Location:  Court yard ?  ?Behavioral response: N/A ?  ?Intervention Topic: Strengths    ?  ?Discussion/Intervention: ?Patient refused to attend group. ?  ?Clinical Observations/Feedback:  ?Patient refused to attend group. ?  ?Jakyri Brunkhorst LRT/CTRS ? ? ? ? ? ? ? ?Jaber Dunlow ?03/08/2022 2:21 PM ?

## 2022-03-08 NOTE — [facility] Counselor (Signed)
CSW sent referral to [facility] for inpatient sud tx for pt, [phone] CSW contacted the [facility] program coordinator and left voicemail with contact information as there was no answer. CSW will follow up at another time to confirm receipt of referral and pending pt application.  ? ?Lisa Hawkins, MSW, LCSW-A ?4/10/20232:31 PM  ?

## 2022-03-09 DIAGNOSIS — F332 Major depressive disorder, recurrent severe without psychotic features: Secondary | ICD-10-CM | POA: Diagnosis not present

## 2022-03-09 LAB — LIPID PANEL
Cholesterol: 155 mg/dL (ref 0–200)
HDL: 54 mg/dL (ref >40)
LDL Cholesterol: 76 mg/dL (ref 0–99)
Total CHOL/HDL Ratio: 2.9 RATIO
Triglycerides: 123 mg/dL (ref <150)
VLDL: 25 mg/dL (ref 0–40)

## 2022-03-09 LAB — HEMOGLOBIN A1C
Hgb A1c MFr Bld: 6.1 % — ABNORMAL HIGH (ref 4.8–5.6)
Mean Plasma Glucose: 128.37 mg/dL

## 2022-03-09 MED ORDER — POTASSIUM CHLORIDE CRYS ER 20 MEQ PO TBCR
20.0000 meq | EXTENDED_RELEASE_TABLET | Freq: Every day | ORAL | Status: DC
  Administered 2022-03-09 – 2022-03-11 (×3): 20 meq via ORAL
  Filled 2022-03-09 (×3): qty 1

## 2022-03-09 MED ORDER — HYDROCHLOROTHIAZIDE 25 MG PO TABS
25.0000 mg | ORAL_TABLET | Freq: Every day | ORAL | Status: DC
  Administered 2022-03-09 – 2022-03-11 (×3): 25 mg via ORAL
  Filled 2022-03-09 (×3): qty 1

## 2022-03-09 NOTE — Progress Notes (Signed)
[facility] MD Progress Note ? ?03/09/2022 2:12 PM ?Lisa Hawkins  ?MRN:  [id number] ?Subjective: Lisa Hawkins is seen on rounds.  Her blood pressure continues to be high.  She asked about hydrochlorothiazide and I told her that is a good idea.  She states that her mood is improved.  She is taking her medications as prescribed and denies any side effects.  She is pleasant and cooperative and interacts well with staff and peers.  She does not want to go to rehab from the hospital but will look into it from home.  She denies any SI.  We talked to her about getting her blood pressure under control and getting her discharged sometime this week. ? ?Principal Problem: MDD (major depressive disorder), recurrent severe, without psychosis ([name]) ?Diagnosis: Principal Problem: ?  MDD (major depressive disorder), recurrent severe, without psychosis ([name]) ? ?Total Time spent with patient: 15 minutes ? ?Past Psychiatric History:  She has had multiple suicide attempts as a teenager with overdoses and has been psychiatrically hospitalized at [facility] for auditory hallucinations and substance abuse.  She says that she used to hear voices but does not any longer ? ?Past Medical History:  ?Past Medical History:  ?Diagnosis Date  ? Anxiety   ? Arthritis   ? Bronchitis   ? Chronic bilateral low back pain with bilateral sciatica 05/19/2020  ? Complication of anesthesia   ? vomitting  ? Dysrhythmia   ? hx of flutter, no longer present  ? Elevated lipids   ? GERD (gastroesophageal reflux disease)   ? does not take meds but has gerd pretty bad  ? Hepatitis   ? Hep C - treated with Harvoni  ? HIV (human immunodeficiency virus infection) ([place])   ? HOH (hard of hearing)   ? Hypertension   ? Myocardial infarction [facility]) 1993  ? Neuropathy   ? Seizures ([place])   ? not considered seizures but does "black out" and unsure of what happened. happens when she overheats. last episode 4 days ago.  ? Stroke [facility])   ? no defecits  ?  ?Past Surgical History:  ?Procedure  Laterality Date  ? ABDOMINAL HYSTERECTOMY  1970  ? BACK SURGERY    ? no metal  ? CATARACT EXTRACTION W/PHACO Right 06/21/2017  ? Procedure: CATARACT EXTRACTION PHACO AND INTRAOCULAR LENS PLACEMENT (IOC);  Surgeon: Birder Robson, MD;  Location: [facility] ORS;  Service: Ophthalmology;  Laterality: Right;  [place] 00:30.9 ?AP% 12.7 ?CDE 3.94 ?Fluid Pack Lot # [id number]  ? CATARACT EXTRACTION W/PHACO Left 07/12/2017  ? Procedure: CATARACT EXTRACTION PHACO AND INTRAOCULAR LENS PLACEMENT (IOC);  Surgeon: Birder Robson, MD;  Location: [facility] ORS;  Service: Ophthalmology;  Laterality: Left;  [place]  00:42 ?AP% 15.2 ?CDE 6.38 ?Fluid pack lot # [id number]  ? COLONOSCOPY WITH PROPOFOL N/A 12/09/2020  ? Procedure: COLONOSCOPY WITH PROPOFOL;  Surgeon: Jonathon Bellows, MD;  Location: [facility] ENDOSCOPY;  Service: Gastroenterology;  Laterality: N/A;  ? DIAGNOSTIC LAPAROSCOPY    ? FRACTURE SURGERY Left   ? wrist  ? HERNIA REPAIR  3149  ? umbilical  ? JOINT REPLACEMENT Bilateral   ? KNEE CLOSED REDUCTION Left 12/30/2016  ? Procedure: CLOSED MANIPULATION KNEE;  Surgeon: Hessie Knows, MD;  Location: [facility] ORS;  Service: Orthopedics;  Laterality: Left;  ? LAPAROTOMY    ? TONSILLECTOMY    ? TOTAL KNEE ARTHROPLASTY Right 07/15/2016  ? Procedure: TOTAL KNEE ARTHROPLASTY;  Surgeon: Hessie Knows, MD;  Location: [facility] ORS;  Service: Orthopedics;  Laterality: Right;  ? TOTAL KNEE  ARTHROPLASTY Left 10/26/2016  ? Procedure: TOTAL KNEE ARTHROPLASTY;  Surgeon: Hessie Knows, MD;  Location: [facility] ORS;  Service: Orthopedics;  Laterality: Left;  ? ?Family History:  ?Family History  ?Problem Relation Age of Onset  ? Hypertension Father   ? ? ?Social History:  ?Social History  ? ?Substance and Sexual Activity  ?Alcohol Use No  ?   ?Social History  ? ?Substance and Sexual Activity  ?Drug Use No  ?  ?Social History  ? ?Socioeconomic History  ? Marital status: Widowed  ?  Spouse name: Not on file  ? Number of children: Not on file  ? Years of education: Not on file  ? Highest  education level: Not on file  ?Occupational History  ? Not on file  ?Tobacco Use  ? Smoking status: Every Day  ?  Packs/day: 0.50  ?  Years: 45.00  ?  Pack years: 22.50  ?  Types: Cigarettes  ? Smokeless tobacco: Never  ?Vaping Use  ? Vaping Use: Never used  ?Substance and Sexual Activity  ? Alcohol use: No  ? Drug use: No  ? Sexual activity: Yes  ?Other Topics Concern  ? Not on file  ?Social History Narrative  ? Not on file  ? ?Social Determinants of Health  ? ?Financial Resource Strain: Not on file  ?Food Insecurity: Not on file  ?Transportation Needs: Not on file  ?Physical Activity: Not on file  ?Stress: Not on file  ?Social Connections: Not on file  ? ?Additional Social History:  ?  ?  ?  ?  ?  ?  ?  ?  ?  ?  ?  ? ?Sleep: Good ? ?Appetite:  Good ? ?Current Medications: ?Current Facility-Administered Medications  ?Medication Dose Route Frequency Provider Last Rate Last Admin  ? abacavir-dolutegravir-lamiVUDine (TRIUMEQ) 299-37-169 MG per tablet 1 tablet  1 tablet Oral Daily Parks Ranger, DO   1 tablet at 03/09/22 6789  ? acetaminophen (TYLENOL) tablet 650 mg  650 mg Oral Q6H PRN Parks Ranger, DO   650 mg at 03/09/22 0128  ? alum & mag hydroxide-simeth (MAALOX/MYLANTA) 200-200-20 MG/5ML suspension 30 mL  30 mL Oral Q4H PRN Parks Ranger, DO      ? amitriptyline (ELAVIL) tablet 100 mg  100 mg Oral QHS Parks Ranger, DO   100 mg at 03/08/22 2113  ? amLODipine (NORVASC) tablet 10 mg  10 mg Oral Daily Parks Ranger, DO   10 mg at 03/09/22 3810  ? aspirin EC tablet 81 mg  81 mg Oral Daily Parks Ranger, DO   81 mg at 03/09/22 1751  ? atorvastatin (LIPITOR) tablet 40 mg  40 mg Oral QHS Parks Ranger, DO   40 mg at 03/08/22 2113  ? celecoxib (CELEBREX) capsule 100 mg  100 mg Oral BID Parks Ranger, DO   100 mg at 03/09/22 0258  ? diphenoxylate-atropine (LOMOTIL) 2.5-0.025 MG per tablet 1 tablet  1 tablet Oral QID PRN Parks Ranger, DO      ? FLUoxetine (PROZAC) capsule 20 mg  20 mg Oral Daily Parks Ranger, DO   20 mg at 03/09/22 5277  ? hydrOXYzine (ATARAX) tablet 25 mg  25 mg Oral Q4H PRN Parks Ranger, DO      ? LORazepam (ATIVAN) injection 2 mg  2 mg Intravenous Q4H PRN Parks Ranger, DO      ? LORazepam (ATIVAN) tablet 1 mg  1 mg Oral TID  PRN Parks Ranger, DO      ? magnesium hydroxide (MILK OF MAGNESIA) suspension 30 mL  30 mL Oral Daily PRN Parks Ranger, DO      ? naproxen (NAPROSYN) tablet 500 mg  500 mg Oral BID PRN Parks Ranger, DO      ? nicotine (NICODERM CQ - dosed in mg/24 hours) patch 21 mg  21 mg Transdermal Daily Parks Ranger, DO   21 mg at 03/09/22 0933  ? OLANZapine (ZYPREXA) tablet 10 mg  10 mg Oral Q6H PRN Parks Ranger, DO      ? ondansetron [facility]) tablet 4 mg  4 mg Oral Q6H PRN Parks Ranger, DO      ? Or  ? ondansetron (ZOFRAN) injection 4 mg  4 mg Intravenous Q6H PRN Parks Ranger, DO      ? pantoprazole (PROTONIX) EC tablet 60 mg  60 mg Oral Daily Parks Ranger, DO   60 mg at 03/09/22 4827  ? risperiDONE (RISPERDAL) tablet 0.5 mg  0.5 mg Oral BH-q8a4p Parks Ranger, DO   0.5 mg at 03/09/22 0754  ? traZODone (DESYREL) tablet 100 mg  100 mg Oral QHS PRN Parks Ranger, DO   100 mg at 03/08/22 2113  ? ? ?Lab Results:  ?Results for orders placed or performed during the hospital encounter of 03/02/22 (from the past 48 hour(s))  ?Hemoglobin A1c     Status: Abnormal  ? Collection Time: 03/09/22  7:13 AM  ?Result Value Ref Range  ? Hgb A1c MFr Bld 6.1 (H) 4.8 - 5.6 %  ?  Comment: (NOTE) ?Pre diabetes:          5.7%-6.4% ? ?Diabetes:              >6.4% ? ?Glycemic control for   <7.0% ?adults with diabetes ?  ? Mean Plasma Glucose 128.37 mg/dL  ?  Comment: Performed at [facility], [place] [address] [place], [place] [address]  ?Lipid panel     Status: None  ? Collection Time:  03/09/22  7:13 AM  ?Result Value Ref Range  ? Cholesterol 155 0 - 200 mg/dL  ? Triglycerides 123 <150 mg/dL  ? HDL 54 >40 mg/dL  ? Total CHOL/HDL Ratio 2.9 RATIO  ? VLDL 25 0 - 40 mg/dL  ? LDL Cholesterol 76 0 - 99 mg/dL

## 2022-03-09 NOTE — Progress Notes (Signed)
Patient is seen in the dayroom interacting with others appropriately. She is AAOx4. She has some swelling (non-pitting) in her bilateral lower legs. Pt was instructed to elevate her feet. She denies SI, HI, and AVH. She denies anxiety and depression. She is medication compliant. She voiced no complaints during this shift. She is safe on the unit at this time and in no distress. Q 15 minute safety checks in place.  ?

## 2022-03-09 NOTE — Plan of Care (Signed)
Patient remain alert and oriented X4, calm and compliant during assessment. Denies pain and discomfort. Denies anxiety and depression. Denies SI, HI, and AVH. Ate breakfast in the day room among staff and peers with good appetite. Compliant with all due medication. Remain safe on the unit with Q15 minute safety check. ? ?Problem: Education: ?Goal: Knowledge of [place] [facility] [place] [facility] information/materials will improve ?Outcome: Progressing ?Goal: Mental status will improve ?Outcome: Progressing ?  ?Problem: Coping: ?Goal: Ability to demonstrate self-control will improve ?Outcome: Progressing ?  ?Problem: Safety: ?Goal: Periods of time without injury will increase ?Outcome: Progressing ?  ?

## 2022-03-09 NOTE — Progress Notes (Signed)
Pt in hallway outside of day room; calm, cooperative. Pt states "I feel good." She c/o BLE pain; edema noted in both lower extremities. She rates her pain 9/10 and accepted offer for pain medication, which she requested to be administered with nighttime medications. She denies SI/HI/AVH, anxiety and depression at this time. She describes her sleep as "pretty good" and her appetite as "good". She also requested sleep medication, as she states that it is the reason that she sleeps good at night. No acute distress noted. ?

## 2022-03-09 NOTE — Progress Notes (Signed)
Recreation Therapy Notes ? ?Date: 03/09/2022 ? ?Time: 1:45PM   ? ?Location: Courtyard   ? ?Behavioral response: N/A ?  ?Intervention Topic: Social skills   ? ?Discussion/Intervention: ?Patient refused to attend group.  ? ?Clinical Observations/Feedback:  ?Patient refused to attend group.  ?  ?Lisa Hawkins LRT/CTRS ? ? ? ? ? ? ? ?Lisa Hawkins ?03/09/2022 4:02 PM ?

## 2022-03-09 NOTE — BHH Counselor (Signed)
CSW contacted the following places for inpatient SUD treatment admission.  ? ?[facility] - no openings ? ?[facility]- no openings ? ?[facility] - referral sent, CSW lett voicemail with referral coordinator.  ? ?Yohanna Tow [place], MSW, LCSW-A ?4/11/202310:30 AM  ? ? ?

## 2022-03-09 NOTE — Group Note (Signed)
LCSW Group Therapy Note ? ?Group Date: 03/09/2022 ?Start Time: 6644 ?End Time: 0347 ? ? ?Type of Therapy and Topic:  Group Therapy - Healthy vs Unhealthy Coping Skills ? ?Participation Level:  Did Not Attend  ? ?Description of Group ?The focus of this group was to determine what unhealthy coping techniques typically are used by group members and what healthy coping techniques would be helpful in coping with various problems. Patients were guided in becoming aware of the differences between healthy and unhealthy coping techniques. Patients were asked to identify 2-3 healthy coping skills they would like to learn to use more effectively. ? ?Therapeutic Goals ?Patients learned that coping is what human beings do all day long to deal with various situations in their lives ?Patients defined and discussed healthy vs unhealthy coping techniques ?Patients identified their preferred coping techniques and identified whether these were healthy or unhealthy ?Patients determined 2-3 healthy coping skills they would like to become more familiar with and use more often. ?Patients provided support and ideas to each other ? ? ?Summary of Patient Progress:   ?X ? ?Therapeutic Modalities ?Cognitive Behavioral Therapy ?Motivational Interviewing ? ?Hanish Laraia A [place], LCSWA ?03/09/2022  6:02 PM   ?

## 2022-03-09 NOTE — BHH Group Notes (Signed)
[place] Group Notes:  (Nursing/MHT/Case Management/Adjunct) ? ?Date:  03/09/2022  ?Time: 9:30 AM ? ?Type of Therapy:  Psychoeducational Skills ? ?Participation Level:  Active ? ?Participation Quality:  Appropriate ? ?Affect:  Appropriate ? ?Cognitive:  Appropriate ? ?Insight:  Appropriate ? ?Engagement in Group:  Engaged ? ?Modes of Intervention:  Discussion ? ?Summary of Progress/Problems: ?The pt. was very engaged in group. The pt followed along with handouts and was very engaged in discussion. ?Lisa Hawkins ?03/09/2022, 12:15 PM ?

## 2022-03-10 NOTE — Progress Notes (Signed)
Recreation Therapy Notes ? ?Date: 03/10/2022 ? ?Time: 1:45pm   ? ?Location: Day room    ? ?Behavioral response: Appropriate ? ?Intervention Topic: Problem Solving   ? ?Discussion/Intervention:  ?Group content on today was focused on problem solving. The group described what problem solving is. Patients expressed how problems affect them and how they deal with problems. Individuals identified healthy ways to deal with problems. Patients explained what normally happens to them when they do not deal with problems. The group expressed reoccurring problems for them. The group participated in the intervention ?Ways to Solve problems? where patients were given a chance to explore different ways to solve problems.  ?Clinical Observations/Feedback: ?Patient came to group an expressed the many problems they face and how to come up with positive solution. Participant shared how effective problem solving can help them in the future. Individual was social with peers and staff while participating in the intervention.    ?Neyra Pettie LRT/CTRS  ? ? ? ? ? ? ? ?Shunta Mclaurin ?03/10/2022 3:08 PM ?

## 2022-03-10 NOTE — Progress Notes (Addendum)
Patient is calm and cooperative with assessment. She denies SI, HI, and AVH. Patient reports 9/10 pain in lower extremities due to swelling. On assessment, patient has bilateral lower extremity edema. However, patient says it has improved from previous day. Patient also states that she had episodes of diarrhea last night, but that she has not had any since taking Lomotil at 1:02 am. Patient encouraged to tell staff if diarrhea returns. Patient is compliant with scheduled medications. She is active in the milieu and is sitting in the dayroom. Patient remains safe on the unit at this time. ?

## 2022-03-10 NOTE — BHH Counselor (Signed)
CSW contacted [place], [phone] regarding female bed availability for inpatient treatment. CSW spoke with Thurnell Lose in admissions who stated that they do not have any openings currently and that the facility would be closed until the beginning of May due to moving to a new facility.  ? ?Lisa Hawkins, MSW, LCSW-A ?4/12/20231:17 PM  ? ? ?

## 2022-03-10 NOTE — Progress Notes (Addendum)
[facility] MD Progress Note ? ?03/10/2022 2:15 PM ?Lisa Hawkins  ?MRN:  [id number] ?Subjective: Lisa Hawkins continues to do well.  She states that she needs to go home before she goes to rehab.  We talked about discharge tomorrow.  She is doing much better.  She feels like the medications have been helpful.  Her blood pressure is better but her pulse is up.  She denies any suicidal ideation. ? ?Principal Problem: MDD (major depressive disorder), recurrent severe, without psychosis ([place]) ?Diagnosis: Principal Problem: ?  MDD (major depressive disorder), recurrent severe, without psychosis ([place]) ? ?Total Time spent with patient: 15 minutes ? ?Past Psychiatric History: She has had multiple suicide attempts as a teenager with overdoses and has been psychiatrically hospitalized at [facility] for auditory hallucinations and substance abuse.  She says that she used to hear voices but does not any longer ? ?Past Medical History:  ?Past Medical History:  ?Diagnosis Date  ? Anxiety   ? Arthritis   ? Bronchitis   ? Chronic bilateral low back pain with bilateral sciatica 05/19/2020  ? Complication of anesthesia   ? vomitting  ? Dysrhythmia   ? hx of flutter, no longer present  ? Elevated lipids   ? GERD (gastroesophageal reflux disease)   ? does not take meds but has gerd pretty bad  ? Hepatitis   ? Hep C - treated with Harvoni  ? HIV (human immunodeficiency virus infection) ([place])   ? HOH (hard of hearing)   ? Hypertension   ? Myocardial infarction [facility]) 1993  ? Neuropathy   ? Seizures ([place])   ? not considered seizures but does "black out" and unsure of what happened. happens when she overheats. last episode 4 days ago.  ? Stroke [facility])   ? no defecits  ?  ?Past Surgical History:  ?Procedure Laterality Date  ? ABDOMINAL HYSTERECTOMY  1970  ? BACK SURGERY    ? no metal  ? CATARACT EXTRACTION W/PHACO Right 06/21/2017  ? Procedure: CATARACT EXTRACTION PHACO AND INTRAOCULAR LENS PLACEMENT (IOC);  Surgeon: Birder Robson, MD;  Location: [facility] ORS;   Service: Ophthalmology;  Laterality: Right;  [place] 00:30.9 ?AP% 12.7 ?CDE 3.94 ?Fluid Pack Lot # [id number]  ? CATARACT EXTRACTION W/PHACO Left 07/12/2017  ? Procedure: CATARACT EXTRACTION PHACO AND INTRAOCULAR LENS PLACEMENT (IOC);  Surgeon: Birder Robson, MD;  Location: [facility] ORS;  Service: Ophthalmology;  Laterality: Left;  [place]  00:42 ?AP% 15.2 ?CDE 6.38 ?Fluid pack lot # [id number]  ? COLONOSCOPY WITH PROPOFOL N/A 12/09/2020  ? Procedure: COLONOSCOPY WITH PROPOFOL;  Surgeon: Jonathon Bellows, MD;  Location: [facility] ENDOSCOPY;  Service: Gastroenterology;  Laterality: N/A;  ? DIAGNOSTIC LAPAROSCOPY    ? FRACTURE SURGERY Left   ? wrist  ? HERNIA REPAIR  5809  ? umbilical  ? JOINT REPLACEMENT Bilateral   ? KNEE CLOSED REDUCTION Left 12/30/2016  ? Procedure: CLOSED MANIPULATION KNEE;  Surgeon: Hessie Knows, MD;  Location: [facility] ORS;  Service: Orthopedics;  Laterality: Left;  ? LAPAROTOMY    ? TONSILLECTOMY    ? TOTAL KNEE ARTHROPLASTY Right 07/15/2016  ? Procedure: TOTAL KNEE ARTHROPLASTY;  Surgeon: Hessie Knows, MD;  Location: [facility] ORS;  Service: Orthopedics;  Laterality: Right;  ? TOTAL KNEE ARTHROPLASTY Left 10/26/2016  ? Procedure: TOTAL KNEE ARTHROPLASTY;  Surgeon: Hessie Knows, MD;  Location: [facility] ORS;  Service: Orthopedics;  Laterality: Left;  ? ?Family History:  ?Family History  ?Problem Relation Age of Onset  ? Hypertension Father   ? ? ?Social History:  ?  Social History  ? ?Substance and Sexual Activity  ?Alcohol Use No  ?   ?Social History  ? ?Substance and Sexual Activity  ?Drug Use No  ?  ?Social History  ? ?Socioeconomic History  ? Marital status: Widowed  ?  Spouse name: Not on file  ? Number of children: Not on file  ? Years of education: Not on file  ? Highest education level: Not on file  ?Occupational History  ? Not on file  ?Tobacco Use  ? Smoking status: Every Day  ?  Packs/day: 0.50  ?  Years: 45.00  ?  Pack years: 22.50  ?  Types: Cigarettes  ? Smokeless tobacco: Never  ?Vaping Use  ? Vaping Use: Never used   ?Substance and Sexual Activity  ? Alcohol use: No  ? Drug use: No  ? Sexual activity: Yes  ?Other Topics Concern  ? Not on file  ?Social History Narrative  ? Not on file  ? ?Social Determinants of Health  ? ?Financial Resource Strain: Not on file  ?Food Insecurity: Not on file  ?Transportation Needs: Not on file  ?Physical Activity: Not on file  ?Stress: Not on file  ?Social Connections: Not on file  ? ?Additional Social History:  ?  ?  ?  ?  ?  ?  ?  ?  ?  ?  ?  ? ?Sleep: Good ? ?Appetite:  Good ? ?Current Medications: ?Current Facility-Administered Medications  ?Medication Dose Route Frequency Provider Last Rate Last Admin  ? abacavir-dolutegravir-lamiVUDine (TRIUMEQ) 696-78-938 MG per tablet 1 tablet  1 tablet Oral Daily Parks Ranger, DO   1 tablet at 03/10/22 1017  ? acetaminophen (TYLENOL) tablet 650 mg  650 mg Oral Q6H PRN Parks Ranger, DO   650 mg at 03/10/22 0920  ? alum & mag hydroxide-simeth (MAALOX/MYLANTA) 200-200-20 MG/5ML suspension 30 mL  30 mL Oral Q4H PRN Parks Ranger, DO      ? amitriptyline (ELAVIL) tablet 100 mg  100 mg Oral QHS Parks Ranger, DO   100 mg at 03/09/22 2146  ? amLODipine (NORVASC) tablet 10 mg  10 mg Oral Daily Parks Ranger, DO   10 mg at 03/10/22 5102  ? aspirin EC tablet 81 mg  81 mg Oral Daily Parks Ranger, DO   81 mg at 03/10/22 5852  ? atorvastatin (LIPITOR) tablet 40 mg  40 mg Oral QHS Parks Ranger, DO   40 mg at 03/09/22 2146  ? celecoxib (CELEBREX) capsule 100 mg  100 mg Oral BID Parks Ranger, DO   100 mg at 03/10/22 7782  ? diphenoxylate-atropine (LOMOTIL) 2.5-0.025 MG per tablet 1 tablet  1 tablet Oral QID PRN Parks Ranger, DO   1 tablet at 03/10/22 0102  ? FLUoxetine (PROZAC) capsule 20 mg  20 mg Oral Daily Parks Ranger, DO   20 mg at 03/10/22 4235  ? hydrochlorothiazide (HYDRODIURIL) tablet 25 mg  25 mg Oral Daily Parks Ranger, DO   25 mg at 03/10/22  3614  ? hydrOXYzine (ATARAX) tablet 25 mg  25 mg Oral Q4H PRN Parks Ranger, DO      ? LORazepam (ATIVAN) injection 2 mg  2 mg Intravenous Q4H PRN Parks Ranger, DO      ? LORazepam (ATIVAN) tablet 1 mg  1 mg Oral TID PRN Parks Ranger, DO      ? magnesium hydroxide (MILK OF MAGNESIA) suspension 30 mL  30 mL  Oral Daily PRN Parks Ranger, DO      ? naproxen (NAPROSYN) tablet 500 mg  500 mg Oral BID PRN Parks Ranger, DO      ? nicotine (NICODERM CQ - dosed in mg/24 hours) patch 21 mg  21 mg Transdermal Daily Parks Ranger, DO   21 mg at 03/10/22 8295  ? OLANZapine (ZYPREXA) tablet 10 mg  10 mg Oral Q6H PRN Parks Ranger, DO      ? ondansetron [facility]) tablet 4 mg  4 mg Oral Q6H PRN Parks Ranger, DO      ? Or  ? ondansetron (ZOFRAN) injection 4 mg  4 mg Intravenous Q6H PRN Parks Ranger, DO      ? pantoprazole (PROTONIX) EC tablet 60 mg  60 mg Oral Daily Parks Ranger, DO   60 mg at 03/10/22 6213  ? potassium chloride SA (KLOR-CON M) CR tablet 20 mEq  20 mEq Oral Daily Parks Ranger, DO   20 mEq at 03/10/22 0865  ? risperiDONE (RISPERDAL) tablet 0.5 mg  0.5 mg Oral BH-q8a4p Parks Ranger, DO   0.5 mg at 03/10/22 0919  ? traZODone (DESYREL) tablet 100 mg  100 mg Oral QHS PRN Parks Ranger, DO   100 mg at 03/09/22 2147  ? ? ?Lab Results:  ?Results for orders placed or performed during the hospital encounter of 03/02/22 (from the past 48 hour(s))  ?Hemoglobin A1c     Status: Abnormal  ? Collection Time: 03/09/22  7:13 AM  ?Result Value Ref Range  ? Hgb A1c MFr Bld 6.1 (H) 4.8 - 5.6 %  ?  Comment: (NOTE) ?Pre diabetes:          5.7%-6.4% ? ?Diabetes:              >6.4% ? ?Glycemic control for   <7.0% ?adults with diabetes ?  ? Mean Plasma Glucose 128.37 mg/dL  ?  Comment: Performed at [facility], [place] [address] [place], [place] [address]  ?Lipid panel     Status: None  ? Collection  Time: 03/09/22  7:13 AM  ?Result Value Ref Range  ? Cholesterol 155 0 - 200 mg/dL  ? Triglycerides 123 <150 mg/dL  ? HDL 54 >40 mg/dL  ? Total CHOL/HDL Ratio 2.9 RATIO  ? VLDL 25 0 - 40 mg/dL  ? LDL Cholest

## 2022-03-11 MED ORDER — RISPERIDONE 0.5 MG PO TABS: 0.5000 mg | ORAL_TABLET | ORAL | 3 refills | Status: DC

## 2022-03-11 MED ORDER — HYDROCHLOROTHIAZIDE 25 MG PO TABS: 25.0000 mg | ORAL_TABLET | Freq: Every day | ORAL | 3 refills | Status: AC

## 2022-03-11 MED ORDER — POTASSIUM CHLORIDE CRYS ER 20 MEQ PO TBCR: 20.0000 meq | EXTENDED_RELEASE_TABLET | Freq: Every day | ORAL | 3 refills | Status: AC

## 2022-03-11 MED ORDER — AMITRIPTYLINE HCL 100 MG PO TABS
100.0000 mg | ORAL_TABLET | Freq: Every day | ORAL | 3 refills | Status: AC
Start: 2022-03-11 — End: ?

## 2022-03-11 MED ORDER — ABACAVIR-DOLUTEGRAVIR-LAMIVUD 600-50-300 MG PO TABS: 1.0000 | ORAL_TABLET | Freq: Every day | ORAL | 3 refills | Status: AC

## 2022-03-11 MED ORDER — FLUOXETINE HCL 20 MG PO CAPS: 20.0000 mg | ORAL_CAPSULE | Freq: Every day | ORAL | 3 refills | Status: AC

## 2022-03-11 MED ORDER — TRAZODONE HCL 100 MG PO TABS: 100.0000 mg | ORAL_TABLET | Freq: Every evening | ORAL | 3 refills | Status: AC | PRN

## 2022-03-11 MED ORDER — NICOTINE 21 MG/24HR TD PT24: 21.0000 mg | MEDICATED_PATCH | Freq: Every day | TRANSDERMAL | 0 refills | Status: DC

## 2022-03-11 MED ORDER — CELECOXIB 100 MG PO CAPS: 100.0000 mg | ORAL_CAPSULE | Freq: Two times a day (BID) | ORAL | 3 refills | Status: AC

## 2022-03-11 MED ORDER — AMLODIPINE BESYLATE 10 MG PO TABS: 10.0000 mg | ORAL_TABLET | Freq: Every day | ORAL | 3 refills | Status: AC

## 2022-03-11 MED ORDER — PANTOPRAZOLE SODIUM 20 MG PO TBEC: 60.0000 mg | DELAYED_RELEASE_TABLET | Freq: Every day | ORAL | 3 refills | Status: DC

## 2022-03-11 NOTE — Care Management Important Message (Signed)
Important Message ? ?Patient Details  ?Name: Lisa Hawkins ?MRN: [id number] ?Date of Birth: 1951-06-12 ? ? ?Medicare Important Message Given:  Yes ? ? ? ? ?Josie Burleigh A [place], LCSWA ?03/11/2022, 10:20 AM ?

## 2022-03-11 NOTE — Progress Notes (Signed)
Pt sitting in chair in day room; calm, cooperative. Pt states "I feel alright." Pt c/o LLE pain, which she rates 9/10 and describes that pain as "shooting" to her foot. She denies SI/HI/AVH, anxiety and depression. She describes her sleep and appetite as "good". No acute distress noted. ?

## 2022-03-11 NOTE — Progress Notes (Signed)
?  [facility] Adult Case Management Discharge Plan : ? ?Will you be returning to the same living situation after discharge:  Yes,  pt is returning home  ?At discharge, do you have transportation home?: Yes,  pt's friend is providing transportation ?Do you have the ability to pay for your medications: Yes,  pt has [facility] Medicare ? ?Release of information consent forms completed and in the chart;  Patient's signature needed at discharge. ? ?Patient to Follow up at: ? Follow-up Information   ? ? [facility] [place] Follow up on 03/17/2022.   ?Specialty: General Practice ?Why: You have an appointment for follow up with your provider, Dr. Georgina Quint and behaivoral health provider on April 19th at 3:40pm. Thanks! ?Contact information: ?[address] ?[place] [address] ?[phone] ? ? ?  ?  ? ?  ?  ? ?  ? ? ?Next level of care provider has access to [place] ? ?Safety Planning and Suicide Prevention discussed: Yes,  completed with pt's significant other ? ?  ? ?Has patient been referred to the Quitline?: Patient refused referral ? ?Patient has been referred for addiction treatment: Yes Pt was referred to [facility], [facility] and [facility] but there were no openings and/or pt did not fit criteria for admittance.  ? ?Lisa Hawkins, [place] ?03/11/2022, 2:12 PM ?

## 2022-03-11 NOTE — [facility] Group Notes (Signed)
[facility] Group Notes:  (Nursing/MHT/Case Management/Adjunct) ? ?Date:  03/11/2022  ?Time:  10:00 AM ? ?Type of Therapy:  Psychoeducational Skills ? ?Participation Level:  Active ? ?Participation Quality:  Appropriate ? ?Affect:  Appropriate ? ?Cognitive:  Appropriate ? ?Insight:  Appropriate ? ?Engagement in Group:  Engaged ? ?Modes of Intervention:  Discussion ? ?Summary of Progress/Problems: ?The pt attended group and was actively engaged during discussion. ?Lisa Hawkins ?03/11/2022, 11:27 AM ?

## 2022-03-11 NOTE — [facility] Suicide Risk Assessment (Signed)
[facility] Discharge Suicide Risk Assessment ? ? ?Principal Problem: MDD (major depressive disorder), recurrent severe, without psychosis ([place]) ?Discharge Diagnoses: Principal Problem: ?  MDD (major depressive disorder), recurrent severe, without psychosis ([place]) ? ? ?Total Time spent with patient: 1 hour ? ?Musculoskeletal: ?Strength & Muscle Tone: within normal limits ?Gait & Station: normal ?Patient leans: N/A ? ?Psychiatric Specialty Exam ? ?Presentation  ?General Appearance: Appropriate for Environment ? ?Eye Contact:Good ? ?Speech:Clear and Coherent ? ?Speech Volume:Normal ? ?Handedness:No data recorded ? ?Mood and Affect  ?Mood:Depressed ? ?Duration of Depression Symptoms: No data recorded ?Affect:Appropriate ? ? ?Thought Process  ?Thought Processes:Coherent ? ?Descriptions of Associations:Intact ? ?Orientation:Full (Time, Place and Person) ? ?Thought Content:Logical ? ?History of Schizophrenia/Schizoaffective disorder:No data recorded ?Duration of Psychotic Symptoms:No data recorded ?Hallucinations:No data recorded ?Ideas of Reference:None ? ?Suicidal Thoughts:No data recorded ?Homicidal Thoughts:No data recorded ? ?Sensorium  ?Memory:Immediate Good ? ?Judgment:Poor ? ?Insight:Fair ? ? ?Executive Functions  ?Concentration:Good ? ?Attention Span:Good ? ?Recall:Good ? ?Fund of [place] ? ?Language:Good ? ? ?Psychomotor Activity  ?Psychomotor Activity:No data recorded ? ?Assets  ?Assets:Desire for Improvement; Physical Health; Resilience; Social Support; Catering manager; Housing ? ? ?Sleep  ?Sleep:No data recorded ? ? ?Blood pressure (!) 148/71, pulse (!) 109, temperature (!) 97.5 ?F (36.4 ?C), temperature source Oral, resp. rate 20, height '5\' 3"'$  (1.6 m), weight 110.7 kg, SpO2 100 %. Body mass index is 43.22 kg/m?. ? ?Mental Status Per Nursing Assessment::   ?On Admission:  NA ? ?Demographic Factors:  ?NA ? ?Loss Factors: ?NA ? ?Historical Factors: ?Prior suicide attempts ? ?Risk Reduction Factors:    ?Positive social support ? ?Continued Clinical Symptoms:  ?Depression:   Impulsivity ? ?Cognitive Features That Contribute To Risk:  ?None   ? ?Suicide Risk:  ?Mild:  Suicidal ideation of limited frequency, intensity, duration, and specificity.  There are no identifiable plans, no associated intent, mild dysphoria and related symptoms, good self-control (both objective and subjective assessment), few other risk factors, and identifiable protective factors, including available and accessible social support. ? ? ? ?Plan Of Care/Follow-up recommendations: See Social Work Note ? ? ?Par[place] Ranger, DO ?03/11/2022, 10:16 AM ?

## 2022-03-11 NOTE — Discharge Summary (Signed)
Physician Discharge Summary Note ? ?Patient:  Lisa Hawkins is an 71 y.o., female ?MRN:  [id number] ?DOB:  Dec 24, 1950 ?Patient phone:  [phone] (home)  ?Patient address:   ?[address] ?[place] [address],  ?Total Time spent with patient: 1 hour ? ?Date of Admission:  03/02/2022 ?Date of Discharge: 03/11/2022 ? ?Reason for Admission:  Patient was seen in consultation on the medical floor. She ingested cleaning substances in what is reported in the ED triage notes as a suicide attempt.  On evaluation today, patient is denying that this was a suicide attempt and states that she did it because "she was high."  She states that she has used illicit substances, mostly cocaine, starting in the early 1980s.  She states that she quit for a while, and then started using heroin when and crack cocaine in the 1990s. ?Patient states, "I am 72 years old, I do not need to be doing this silliness, using drugs," she states that she has not been in rehab before but is interested in stopping the use of all illicit substances.  She states that she is spending $900 a month on crack cocaine.  Patient reports that she was on Paxil for about a year but has recently stopped. Feels it may have been helpful.  She does report that sometimes she has auditory or visual hallucinations even when she is not using crack cocaine. ? ?Principal Problem: MDD (major depressive disorder), recurrent severe, without psychosis ([place]) ?Discharge Diagnoses: Principal Problem: ?  MDD (major depressive disorder), recurrent severe, without psychosis ([place]) ? ? ?Past Psychiatric History:  She has had multiple suicide attempts as a teenager with overdoses and has been psychiatrically hospitalized at [facility] for auditory hallucinations and substance abuse.  She says that she used to hear voices but does not any longer ? ?Past Medical History:  ?Past Medical History:  ?Diagnosis Date  ? Anxiety   ? Arthritis   ? Bronchitis   ? Chronic bilateral low back pain with  bilateral sciatica 05/19/2020  ? Complication of anesthesia   ? vomitting  ? Dysrhythmia   ? hx of flutter, no longer present  ? Elevated lipids   ? GERD (gastroesophageal reflux disease)   ? does not take meds but has gerd pretty bad  ? Hepatitis   ? Hep C - treated with Harvoni  ? HIV (human immunodeficiency virus infection) ([place])   ? HOH (hard of hearing)   ? Hypertension   ? Myocardial infarction [facility]) 1993  ? Neuropathy   ? Seizures ([place])   ? not considered seizures but does "black out" and unsure of what happened. happens when she overheats. last episode 4 days ago.  ? Stroke [facility])   ? no defecits  ?  ?Past Surgical History:  ?Procedure Laterality Date  ? ABDOMINAL HYSTERECTOMY  1970  ? BACK SURGERY    ? no metal  ? CATARACT EXTRACTION W/PHACO Right 06/21/2017  ? Procedure: CATARACT EXTRACTION PHACO AND INTRAOCULAR LENS PLACEMENT (IOC);  Surgeon: Birder Robson, MD;  Location: [facility] ORS;  Service: Ophthalmology;  Laterality: Right;  [place] 00:30.9 ?AP% 12.7 ?CDE 3.94 ?Fluid Pack Lot # [id number]  ? CATARACT EXTRACTION W/PHACO Left 07/12/2017  ? Procedure: CATARACT EXTRACTION PHACO AND INTRAOCULAR LENS PLACEMENT (IOC);  Surgeon: Birder Robson, MD;  Location: [facility] ORS;  Service: Ophthalmology;  Laterality: Left;  [place]  00:42 ?AP% 15.2 ?CDE 6.38 ?Fluid pack lot # [id number]  ? COLONOSCOPY WITH PROPOFOL N/A 12/09/2020  ? Procedure: COLONOSCOPY WITH PROPOFOL;  Surgeon: Jonathon Bellows, MD;  Location: [facility] ENDOSCOPY;  Service: Gastroenterology;  Laterality: N/A;  ? DIAGNOSTIC LAPAROSCOPY    ? FRACTURE SURGERY Left   ? wrist  ? HERNIA REPAIR  2956  ? umbilical  ? JOINT REPLACEMENT Bilateral   ? KNEE CLOSED REDUCTION Left 12/30/2016  ? Procedure: CLOSED MANIPULATION KNEE;  Surgeon: Hessie Knows, MD;  Location: [facility] ORS;  Service: Orthopedics;  Laterality: Left;  ? LAPAROTOMY    ? TONSILLECTOMY    ? TOTAL KNEE ARTHROPLASTY Right 07/15/2016  ? Procedure: TOTAL KNEE ARTHROPLASTY;  Surgeon: Hessie Knows, MD;  Location: [facility] ORS;   Service: Orthopedics;  Laterality: Right;  ? TOTAL KNEE ARTHROPLASTY Left 10/26/2016  ? Procedure: TOTAL KNEE ARTHROPLASTY;  Surgeon: Hessie Knows, MD;  Location: [facility] ORS;  Service: Orthopedics;  Laterality: Left;  ? ?Family History:  ?Family History  ?Problem Relation Age of Onset  ? Hypertension Father   ? ? ?Social History:  ?Social History  ? ?Substance and Sexual Activity  ?Alcohol Use No  ?   ?Social History  ? ?Substance and Sexual Activity  ?Drug Use No  ?  ?Social History  ? ?Socioeconomic History  ? Marital status: Widowed  ?  Spouse name: Not on file  ? Number of children: Not on file  ? Years of education: Not on file  ? Highest education level: Not on file  ?Occupational History  ? Not on file  ?Tobacco Use  ? Smoking status: Every Day  ?  Packs/day: 0.50  ?  Years: 45.00  ?  Pack years: 22.50  ?  Types: Cigarettes  ? Smokeless tobacco: Never  ?Vaping Use  ? Vaping Use: Never used  ?Substance and Sexual Activity  ? Alcohol use: No  ? Drug use: No  ? Sexual activity: Yes  ?Other Topics Concern  ? Not on file  ?Social History Narrative  ? Not on file  ? ?Social Determinants of Health  ? ?Financial Resource Strain: Not on file  ?Food Insecurity: Not on file  ?Transportation Needs: Not on file  ?Physical Activity: Not on file  ?Stress: Not on file  ?Social Connections: Not on file  ? ? ?Hospital Course: Kathrine was admitted on transfer from internal medicine after ingesting home cleaners in a suicide attempt.  She was initially involuntary but signed voluntarily.  She stated that she was depressed and became suicidal because she could not stop doing crack cocaine.  She wanted to go to rehab initially but then changed her mind because she wanted to return home and move out of her house to be away from her roommate/husband of 50 years.  Apparently, they have been having problems for a long time and he also uses.  She plans on initiating rehab on her own.  While on the unit she was pleasant and cooperative and  her medications were changed.  She continued on Elavil 100 mg at bedtime and her Paxil was changed to Prozac 20 mg/day and a low-dose Risperdal 0.5 mg twice a day were added.  Trazodone was ordered as needed but she was taking it on a regular basis.  Her mood and affect improved and she was pleasant and cooperative on the unit and interacted well with staff and peers.  She did have blood pressure problems and her hypertensive medications were adjusted but she needs to follow up with her PCP.  She did not have any withdrawal symptoms.  Her HIV medications were restarted.  Overall, her mood and affect improved  and she requested to be discharged.  It was felt that she maximized hospitalization she was discharged home.  On the day of discharge she denied suicidal ideation, homicidal ideation, auditory or visual hallucinations.  Her judgment and insight were good. ? ?Physical Findings: ?AIMS:  , ,  ,  ,    ?CIWA:    ?COWS:    ? ?Musculoskeletal: ?Strength & Muscle Tone: within normal limits ?Gait & Station: normal ?Patient leans: N/A ? ? ?Psychiatric Specialty Exam: ? ?Presentation  ?General Appearance: Appropriate for Environment ? ?Eye Contact:Good ? ?Speech:Clear and Coherent ? ?Speech Volume:Normal ? ?Handedness:No data recorded ? ?Mood and Affect  ?Mood:Depressed ? ?Affect:Appropriate ? ? ?Thought Process  ?Thought Processes:Coherent ? ?Descriptions of Associations:Intact ? ?Orientation:Full (Time, Place and Person) ? ?Thought Content:Logical ? ?History of Schizophrenia/Schizoaffective disorder:No data recorded ?Duration of Psychotic Symptoms:No data recorded ?Hallucinations:No data recorded ?Ideas of Reference:None ? ?Suicidal Thoughts:No data recorded ?Homicidal Thoughts:No data recorded ? ?Sensorium  ?Memory:Immediate Good ? ?Judgment:Poor ? ?Insight:Fair ? ? ?Executive Functions  ?Concentration:Good ? ?Attention Span:Good ? ?Recall:Good ? ?Fund of [place] ? ?Language:Good ? ? ?Psychomotor Activity   ?Psychomotor Activity:No data recorded ? ?Assets  ?Assets:Desire for Improvement; Physical Health; Resilience; Social Support; Catering manager; Housing ? ? ?Sleep  ?Sleep:No data recorded ? ? ?Phy

## 2022-03-11 NOTE — Progress Notes (Signed)
Reviewed discharge instructions with patient including follow up appointment with provider, medication and prescriptions. Questions answered and understanding verbalized.  Discharge packet given. All belongings returned to patient after verification completed by staff.   ?Patient escorted by staff off unit at this time in stable condition without complaint. ?

## 2022-03-11 NOTE — Progress Notes (Signed)
Patient remain alert and oriented X4 calm and compliant with assessment. Denies SI, HI, and AVH. Denies anxiety and depression. Patient report sleeping well last night. Looking forward to discharge. Ate meals in the day room among peers with good appetite. Compliant with all due medication and tolerated well. Patient is currently in the day room participating in group. Remain safe with Q15 minute safety check. ?

## 2022-06-07 ENCOUNTER — Other Ambulatory Visit: Payer: Self-pay

## 2022-06-07 ENCOUNTER — Telehealth: Payer: Self-pay

## 2022-06-07 DIAGNOSIS — Z1211 Encounter for screening for malignant neoplasm of colon: Secondary | ICD-10-CM

## 2022-06-07 MED ORDER — PEG 3350-KCL-NA BICARB-NACL 420 G PO SOLR: ORAL | 0 refills | Status: AC

## 2022-06-07 NOTE — Telephone Encounter (Signed)
Gastroenterology Pre-Procedure Review  Request Date: 06/18/22 Requesting Physician: Dr. Vicente Males  PATIENT REVIEW QUESTIONS: The patient responded to the following health history questions as indicated:    1. Are you having any GI issues? no 2. Do you have a personal history of Polyps? no 3. Do you have a family history of Colon Cancer or Polyps? no 4. Diabetes Mellitus? no 5. Joint replacements in the past 12 months?no 6. Major health problems in the past 3 months?no 7. Any artificial heart valves, MVP, or defibrillator?no    MEDICATIONS & ALLERGIES:    Patient reports the following regarding taking any anticoagulation/antiplatelet therapy:   Plavix, Coumadin, Eliquis, Xarelto, Lovenox, Pradaxa, Brilinta, or Effient? no Aspirin? yes (81 mg daily)  Patient confirms/reports the following medications:  Current Outpatient Medications  Medication Sig Dispense Refill   abacavir-dolutegravir-lamiVUDine (TRIUMEQ) 600-50-300 MG tablet Take 1 tablet by mouth daily. 30 tablet 3   amitriptyline (ELAVIL) 100 MG tablet Take 1 tablet (100 mg total) by mouth at bedtime. 30 tablet 3   amLODipine (NORVASC) 10 MG tablet Take 1 tablet (10 mg total) by mouth daily. 30 tablet 3   Aspirin 81 MG CAPS Take 1 capsule by mouth daily.     atorvastatin (LIPITOR) 40 MG tablet Take 40 mg by mouth at bedtime.     celecoxib (CELEBREX) 100 MG capsule Take 1 capsule (100 mg total) by mouth 2 (two) times daily. 60 capsule 3   FLUoxetine (PROZAC) 20 MG capsule Take 1 capsule (20 mg total) by mouth daily. 30 capsule 3   hydrochlorothiazide (HYDRODIURIL) 25 MG tablet Take 1 tablet (25 mg total) by mouth daily. 30 tablet 3   NICODERM CQ 21 MG/24HR patch 21 mg daily.     nicotine (NICODERM CQ) 21 mg/24hr patch Place 1 patch (21 mg total) onto the skin daily. 28 patch 0   pantoprazole (PROTONIX) 20 MG tablet Take 3 tablets (60 mg total) by mouth daily. 30 tablet 3   potassium chloride SA (KLOR-CON M) 20 MEQ tablet Take 1 tablet  (20 mEq total) by mouth daily. 30 tablet 3   risperiDONE (RISPERDAL) 0.5 MG tablet Take 1 tablet (0.5 mg total) by mouth 2 (two) times daily at 8 am and 4 pm. 60 tablet 3   traZODone (DESYREL) 100 MG tablet Take 1 tablet (100 mg total) by mouth at bedtime as needed for sleep. 30 tablet 3   No current facility-administered medications for this visit.    Patient confirms/reports the following allergies:  Allergies  Allergen Reactions   Penicillins Swelling and Rash    Has patient had a PCN reaction causing immediate rash, facial/tongue/throat swelling, SOB or lightheadedness with hypotension: Yes Has patient had a PCN reaction causing severe rash involving mucus membranes or skin necrosis: No Has patient had a PCN reaction that required hospitalization Yes Has patient had a PCN reaction occurring within the last 10 years: No If all of the above answers are "NO", then may proceed with Cephalosporin use.     No orders of the defined types were placed in this encounter.   AUTHORIZATION INFORMATION Primary Insurance: 1D#: Group #:  Secondary Insurance: 1D#: Group #:  SCHEDULE INFORMATION: Date: 06/18/22 Time: Location: [facility]

## 2022-06-10 ENCOUNTER — Other Ambulatory Visit: Payer: Self-pay | Admitting: Nurse Practitioner

## 2022-06-10 DIAGNOSIS — Z8619 Personal history of other infectious and parasitic diseases: Secondary | ICD-10-CM

## 2022-06-10 DIAGNOSIS — K7460 Unspecified cirrhosis of liver: Secondary | ICD-10-CM

## 2022-06-11 ENCOUNTER — Telehealth: Payer: Self-pay

## 2022-06-11 NOTE — Telephone Encounter (Signed)
Patient lvm to call her back to reschedule her colonoscopy.  Call returned lvm for her to call back.  Thanks, [place], [place]

## 2022-07-07 ENCOUNTER — Ambulatory Visit: Admission: RE | Admit: 2022-07-07 | Payer: Medicare HMO | Source: Home / Self Care | Admitting: Gastroenterology

## 2022-07-07 ENCOUNTER — Encounter: Admission: RE | Payer: Self-pay | Source: Home / Self Care

## 2022-07-07 SURGERY — COLONOSCOPY WITH PROPOFOL
Anesthesia: General

## 2022-07-12 ENCOUNTER — Ambulatory Visit: Admission: RE | Admit: 2022-07-12 | Payer: Medicare HMO | Source: Ambulatory Visit

## 2022-08-05 ENCOUNTER — Ambulatory Visit
Admission: RE | Admit: 2022-08-05 | Discharge: 2022-08-05 | Disposition: A | Discharge status: Home / Self Care | Payer: Medicare HMO | Source: Ambulatory Visit | Attending: Nurse Practitioner | Admitting: Nurse Practitioner

## 2022-08-05 DIAGNOSIS — Z8619 Personal history of other infectious and parasitic diseases: Secondary | ICD-10-CM | POA: Insufficient documentation

## 2022-08-05 DIAGNOSIS — K7460 Unspecified cirrhosis of liver: Secondary | ICD-10-CM | POA: Insufficient documentation

## 2022-08-09 ENCOUNTER — Telehealth: Payer: Self-pay | Admitting: Gastroenterology

## 2022-08-09 NOTE — Telephone Encounter (Signed)
Pt would like to schedule a colonoscopy. Pt said she thought she had one scheduled already.

## 2022-08-10 ENCOUNTER — Telehealth: Payer: Self-pay

## 2022-08-10 ENCOUNTER — Other Ambulatory Visit: Payer: Self-pay

## 2022-08-10 DIAGNOSIS — Z1211 Encounter for screening for malignant neoplasm of colon: Secondary | ICD-10-CM

## 2022-08-10 NOTE — Telephone Encounter (Signed)
Patient contacted office to reschedule her colonoscopy from 06/18/22.  Colonoscopy has been rescheduled for 09/06/22.  Instructions reviewed.  Thanks,  [place], [place]

## 2022-08-24 ENCOUNTER — Telehealth: Payer: Self-pay | Admitting: *Deleted

## 2022-08-24 NOTE — Telephone Encounter (Signed)
Patient left a voicemail concerning her prep I believe. The message was not clear.  Voicemail message left for patient to return my call.

## 2022-08-25 NOTE — Telephone Encounter (Signed)
Colonoscopy instructions discussed with patient for better understanding.  I informed her that she can call back if she needs to ask or get additional help understanding her instructions.  Thanks,  [place], [place]

## 2022-09-02 NOTE — Telephone Encounter (Signed)
Patient called the office again. Explain step by step, how to take Nuly[place]. We went over a couple more times. Also patient was concern about not knowing the arrival time due to transportation. Tried to explain that we do not know the arrival time, to find out she can call [facility] endo unit but I told her that she won't find out until Friday, 10/6/20223 since the colonoscopy is not until Monday 09/06/2022. Patient insisted on me giving her the phone number.

## 2022-09-06 ENCOUNTER — Ambulatory Visit: Payer: Medicare HMO | Admitting: Registered Nurse

## 2022-09-06 ENCOUNTER — Ambulatory Visit
Admission: RE | Admit: 2022-09-06 | Discharge: 2022-09-06 | Disposition: A | Discharge status: Home / Self Care | Payer: Medicare HMO | Attending: Gastroenterology | Admitting: Gastroenterology

## 2022-09-06 ENCOUNTER — Encounter: Payer: Self-pay | Admitting: Gastroenterology

## 2022-09-06 ENCOUNTER — Encounter
Admission: RE | Disposition: A | Discharge status: Home / Self Care | Payer: Self-pay | Source: Home / Self Care | Attending: Gastroenterology

## 2022-09-06 ENCOUNTER — Other Ambulatory Visit: Payer: Self-pay

## 2022-09-06 DIAGNOSIS — B1920 Unspecified viral hepatitis C without hepatic coma: Secondary | ICD-10-CM | POA: Diagnosis not present

## 2022-09-06 DIAGNOSIS — Z6838 Body mass index (BMI) 38.0-38.9, adult: Secondary | ICD-10-CM | POA: Insufficient documentation

## 2022-09-06 DIAGNOSIS — F419 Anxiety disorder, unspecified: Secondary | ICD-10-CM | POA: Diagnosis not present

## 2022-09-06 DIAGNOSIS — Z1211 Encounter for screening for malignant neoplasm of colon: Secondary | ICD-10-CM

## 2022-09-06 DIAGNOSIS — Z21 Asymptomatic human immunodeficiency virus [HIV] infection status: Secondary | ICD-10-CM | POA: Insufficient documentation

## 2022-09-06 DIAGNOSIS — I10 Essential (primary) hypertension: Secondary | ICD-10-CM | POA: Insufficient documentation

## 2022-09-06 DIAGNOSIS — K219 Gastro-esophageal reflux disease without esophagitis: Secondary | ICD-10-CM | POA: Diagnosis not present

## 2022-09-06 DIAGNOSIS — F17210 Nicotine dependence, cigarettes, uncomplicated: Secondary | ICD-10-CM | POA: Diagnosis not present

## 2022-09-06 DIAGNOSIS — J449 Chronic obstructive pulmonary disease, unspecified: Secondary | ICD-10-CM | POA: Insufficient documentation

## 2022-09-06 DIAGNOSIS — M1990 Unspecified osteoarthritis, unspecified site: Secondary | ICD-10-CM | POA: Diagnosis not present

## 2022-09-06 DIAGNOSIS — Z79899 Other long term (current) drug therapy: Secondary | ICD-10-CM | POA: Insufficient documentation

## 2022-09-06 DIAGNOSIS — I252 Old myocardial infarction: Secondary | ICD-10-CM | POA: Insufficient documentation

## 2022-09-06 HISTORY — PX: COLONOSCOPY WITH PROPOFOL: SHX5780

## 2022-09-06 SURGERY — COLONOSCOPY WITH PROPOFOL
Anesthesia: General

## 2022-09-06 MED ORDER — SODIUM CHLORIDE 0.9 % IV SOLN
INTRAVENOUS | Status: DC
  Administered 2022-09-06: 20 mL/h via INTRAVENOUS

## 2022-09-06 MED ORDER — PROPOFOL 10 MG/ML IV BOLUS
INTRAVENOUS | Status: DC | PRN
  Administered 2022-09-06: 90 mg via INTRAVENOUS

## 2022-09-06 MED ORDER — LIDOCAINE HCL (CARDIAC) PF 100 MG/5ML IV SOSY
PREFILLED_SYRINGE | INTRAVENOUS | Status: DC | PRN
  Administered 2022-09-06: 40 mg via INTRAVENOUS

## 2022-09-06 MED ORDER — PROPOFOL 10 MG/ML IV BOLUS
INTRAVENOUS | Status: AC
  Filled 2022-09-06: qty 20

## 2022-09-06 MED ORDER — PROPOFOL 500 MG/50ML IV EMUL
INTRAVENOUS | Status: DC | PRN
  Administered 2022-09-06: 150 ug/kg/min via INTRAVENOUS

## 2022-09-06 NOTE — Transfer of Care (Signed)
Immediate Anesthesia Transfer of Care Note  Patient: Lisa Hawkins  Procedure(s) Performed: Procedure(s): COLONOSCOPY WITH PROPOFOL (N/A)  Patient Location: PACU and Endoscopy Unit  Anesthesia Type:General  Level of Consciousness: sedated  Airway & Oxygen Therapy: Patient Spontanous Breathing and Patient connected to nasal cannula oxygen  Post-op Assessment: Report given to RN and Post -op Vital signs reviewed and stable  Post vital signs: Reviewed and stable  Last Vitals:  Vitals:   09/06/22 0917  BP: (!) 157/93  Pulse: (!) 107  Resp: 18  Temp: (!) 35.9 C  SpO2: 005%    Complications: No apparent anesthesia complications

## 2022-09-06 NOTE — Anesthesia Postprocedure Evaluation (Signed)
Anesthesia Post Note  Patient: Lisa Hawkins  Procedure(s) Performed: COLONOSCOPY WITH PROPOFOL  Patient location during evaluation: PACU Anesthesia Type: General Level of consciousness: awake (Short episode of seizures post op, hx of seizures for which she does not take any medications) Pain management: satisfactory to patient Respiratory status: spontaneous breathing and nonlabored ventilation Cardiovascular status: stable Anesthetic complications: no   No notable events documented.   Last Vitals:  Vitals:   09/06/22 0917 09/06/22 1018  BP: (!) 157/93   Pulse: (!) 107   Resp: 18   Temp: (!) 35.9 C (!) 35.8 C  SpO2: 100%     Last Pain:  Vitals:   09/06/22 1018  TempSrc: Temporal  PainSc: 0-No pain                 VAN STAVEREN,Aayushi Solorzano

## 2022-09-06 NOTE — H&P (Signed)
Jonathon Bellows, MD [address], [place], [place], [place], [address] [address], [place], [place], [place], [address] Phone: [phone]  Fax: [phone]  Primary Care Physician:  [facility]   Pre-Procedure History & Physical: HPI:  Lisa Hawkins is a 71 y.o. female is here for an colonoscopy.   Past Medical History:  Diagnosis Date   Anxiety    Arthritis    Bronchitis    Chronic bilateral low back pain with bilateral sciatica 3/53/6144   Complication of anesthesia    vomitting   Dysrhythmia    hx of flutter, no longer present   Elevated lipids    GERD (gastroesophageal reflux disease)    does not take meds but has gerd pretty bad   Hepatitis    Hep C - treated with Harvoni   HIV (human immunodeficiency virus infection) ([place])    HOH (hard of hearing)    Hypertension    Myocardial infarction ([place]) 1993   Neuropathy    Seizures ([place])    not considered seizures but does "black out" and unsure of what happened. happens when she overheats. last episode 4 days ago.   Stroke ([place])    no defecits    Past Surgical History:  Procedure Laterality Date   ABDOMINAL HYSTERECTOMY  1970   BACK SURGERY     no metal   CATARACT EXTRACTION W/PHACO Right 06/21/2017   Procedure: CATARACT EXTRACTION PHACO AND INTRAOCULAR LENS PLACEMENT (IOC);  Surgeon: Birder Robson, MD;  Location: [facility] ORS;  Service: Ophthalmology;  Laterality: Right;  [place] 00:30.9 AP% 12.7 CDE 3.94 Fluid Pack Lot # [id number]   CATARACT EXTRACTION W/PHACO Left 07/12/2017   Procedure: CATARACT EXTRACTION PHACO AND INTRAOCULAR LENS PLACEMENT (IOC);  Surgeon: Birder Robson, MD;  Location: [facility] ORS;  Service: Ophthalmology;  Laterality: Left;  [place]  00:42 AP% 15.2 CDE 6.38 Fluid pack lot # [id number]   COLONOSCOPY WITH PROPOFOL N/A 12/09/2020   Procedure: COLONOSCOPY WITH PROPOFOL;  Surgeon: Jonathon Bellows, MD;  Location: [facility] ENDOSCOPY;  Service: Gastroenterology;  Laterality: N/A;    DIAGNOSTIC LAPAROSCOPY     FRACTURE SURGERY Left    wrist   HERNIA REPAIR  [id number]   umbilical   JOINT REPLACEMENT Bilateral    KNEE CLOSED REDUCTION Left 12/30/2016   Procedure: CLOSED MANIPULATION KNEE;  Surgeon: Hessie Knows, MD;  Location: [facility] ORS;  Service: Orthopedics;  Laterality: Left;   LAPAROTOMY     TONSILLECTOMY     TOTAL KNEE ARTHROPLASTY Right 07/15/2016   Procedure: TOTAL KNEE ARTHROPLASTY;  Surgeon: Hessie Knows, MD;  Location: [facility] ORS;  Service: Orthopedics;  Laterality: Right;   TOTAL KNEE ARTHROPLASTY Left 10/26/2016   Procedure: TOTAL KNEE ARTHROPLASTY;  Surgeon: Hessie Knows, MD;  Location: [facility] ORS;  Service: Orthopedics;  Laterality: Left;    Prior to Admission medications   Medication Sig Start Date End Date Taking? Authorizing Provider  abacavir-dolutegravir-lamiVUDine (TRIUMEQ) 600-50-300 MG tablet Take 1 tablet by mouth daily. 03/12/22  Yes Parks Ranger, DO  amitriptyline (ELAVIL) 100 MG tablet Take 1 tablet (100 mg total) by mouth at bedtime. 03/11/22  Yes Parks Ranger, DO  amLODipine (NORVASC) 10 MG tablet Take 1 tablet (10 mg total) by mouth daily. 03/12/22  Yes Parks Ranger, DO  Aspirin 81 MG CAPS Take 1 capsule by mouth daily. 02/02/18  Yes [provider]  atorvastatin (LIPITOR) 40 MG tablet Take 40 mg by mouth at bedtime. 06/30/21  Yes [provider]  celecoxib (CELEBREX) 100 MG capsule Take 1 capsule (100 mg total) by mouth 2 (two) times daily. 03/11/22  Yes Parks Ranger, DO  FLUoxetine (PROZAC) 20 MG capsule Take 1 capsule (20 mg total) by mouth daily. 03/12/22  Yes Parks Ranger, DO  hydrochlorothiazide (HYDRODIURIL) 25 MG tablet Take 1 tablet (25 mg total) by mouth daily. 03/12/22  Yes Herrick, Richard Edward, DO  NICODERM CQ 21 MG/24HR patch 21 mg daily. 09/24/21  Yes [provider]  nicotine (NICODERM CQ) 21 mg/24hr patch Place 1 patch (21 mg total) onto the skin daily. 03/12/22  Yes  Parks Ranger, DO  pantoprazole (PROTONIX) 20 MG tablet Take 3 tablets (60 mg total) by mouth daily. 03/12/22  Yes Parks Ranger, DO  polyethylene glycol-electrolytes (NULYTELY) 420 g solution At 5pm evening before colonoscopy-Fill Nulytely container to the fill line with clear liquid.  Mix well.  Drink 8 oz every 20 mins until half contents have been completed. Continue clear liquid diet.  5 hours prior to colonoscopy resume drinking the remaining Nulytely prep drinking 8 oz every 20 mins until entire contents have been completed. So not eat or drink anything 2 hours prior to colonsocopy. 06/07/22  Yes Jonathon Bellows, MD  potassium chloride SA (KLOR-CON M) 20 MEQ tablet Take 1 tablet (20 mEq total) by mouth daily. 03/12/22  Yes Parks Ranger, DO  risperiDONE (RISPERDAL) 0.5 MG tablet Take 1 tablet (0.5 mg total) by mouth 2 (two) times daily at 8 am and 4 pm. 03/11/22  Yes Parks Ranger, DO  traZODone (DESYREL) 100 MG tablet Take 1 tablet (100 mg total) by mouth at bedtime as needed for sleep. 03/11/22  Yes Parks Ranger, DO    Allergies as of 08/10/2022 - Review Complete 06/07/2022  Allergen Reaction Noted   Penicillins Swelling and Rash 06/15/2016    Family History  Problem Relation Age of Onset   Hypertension Father     Social History   Socioeconomic History   Marital status: Widowed    Spouse name: Not on file   Number of children: Not on file   Years of education: Not on file   Highest education level: Not on file  Occupational History   Not on file  Tobacco Use   Smoking status: Every Day    Packs/day: 1.00    Years: 45.00    Total pack years: 45.00    Types: Cigarettes   Smokeless tobacco: Never  Vaping Use   Vaping Use: Never used  Substance and Sexual Activity   Alcohol use: No   Drug use: No   Sexual activity: Yes  Other Topics Concern   Not on file  Social History Narrative   Not on file   Social Determinants of Health    Financial Resource Strain: Not on file  Food Insecurity: Not on file  Transportation Needs: Not on file  Physical Activity: Not on file  Stress: Not on file  Social Connections: Not on file  Intimate Partner Violence: Not on file    Review of Systems: See HPI, otherwise negative ROS  Physical Exam: BP (!) 157/93   Pulse (!) 107   Temp (!) 96.7 F (35.9 C) (Temporal)   Resp 18   Ht '5\' 3"'$  (1.6 m)   Wt 99.8 kg   SpO2 100%   BMI 38.97 kg/m  General:   Alert,  pleasant and cooperative in NAD Head:  Normocephalic and atraumatic. Neck:  Supple; no masses or thyromegaly.  Lungs:  Clear throughout to auscultation, normal respiratory effort.    Heart:  +S1, +S2, Regular rate and rhythm, No edema. Abdomen:  Soft, nontender and nondistended. Normal bowel sounds, without guarding, and without rebound.   Neurologic:  Alert and  oriented x4;  grossly normal neurologically.  Impression/Plan: Lisa Hawkins is here for an colonoscopy to be performed for Screening colonoscopy average risk   Risks, benefits, limitations, and alternatives regarding  colonoscopy have been reviewed with the patient.  Questions have been answered.  All parties agreeable.   Jonathon Bellows, MD  09/06/2022, 9:28 AM

## 2022-09-06 NOTE — Op Note (Signed)
[facility] Gastroenterology Patient Name: Lisa Hawkins Procedure Date: 09/06/2022 9:27 AM MRN: [id number] Account #: [id number] Date of Birth: 1950-12-24 Admit Type: Outpatient Age: 71 Room: [facility] ENDO ROOM 1 Gender: Female Note Status: Finalized Instrument Name: Jasper Riling [id number] Procedure:             Colonoscopy Indications:           Screening for colorectal malignant neoplasm Providers:             Jonathon Bellows MD, MD Referring MD:          Edmonia Lynch. Aycock MD (Referring MD) Medicines:             Propofol per Anesthesia, Monitored Anesthesia Care Complications:         No immediate complications. Procedure:             Pre-Anesthesia Assessment:                        - Prior to the procedure, a History and Physical was                         performed, and patient medications, allergies and                         sensitivities were reviewed. The patient's tolerance                         of previous anesthesia was reviewed.                        - The risks and benefits of the procedure and the                         sedation options and risks were discussed with the                         patient. All questions were answered and informed                         consent was obtained.                        - ASA Grade Assessment: II - A patient with mild                         systemic disease.                        After obtaining informed consent, the colonoscope was                         passed under direct vision. Throughout the procedure,                         the patient's blood pressure, pulse, and oxygen                         saturations were monitored continuously. The  Colonoscope was introduced through the anus and                         advanced to the the cecum, identified by the                         appendiceal orifice. The colonoscopy was performed                         with ease. The colonoscopy  was performed with moderate                         difficulty due to a tortuous colon. The patient                         tolerated the procedure well. The quality of the bowel                         preparation was good. Findings:      The entire examined colon appeared normal on direct and retroflexion       views. Impression:            - The entire examined colon is normal on direct and                         retroflexion views.                        - No specimens collected. Recommendation:        - Discharge patient to home (with escort).                        - Resume previous diet.                        - Continue present medications.                        - Repeat colonoscopy is not recommended due to current                         age (104 years or older) for screening purposes. Procedure Code(s):     --- Professional ---                        [phone], Colonoscopy, flexible; diagnostic, including                         collection of specimen(s) by brushing or washing, when                         performed (separate procedure) Diagnosis Code(s):     --- Professional ---                        Z12.11, Encounter for screening for malignant neoplasm                         of colon CPT copyright 2019 American Medical Association. All rights reserved. The codes documented in this  report are preliminary and upon coder review may  be revised to meet current compliance requirements. Jonathon Bellows, MD Jonathon Bellows MD, MD 09/06/2022 10:06:09 AM This report has been signed electronically. Number of Addenda: 0 Note Initiated On: 09/06/2022 9:27 AM Scope Withdrawal Time: 0 hours 5 minutes 48 seconds  Total Procedure Duration: 0 hours 22 minutes 31 seconds  Estimated Blood Loss:  Estimated blood loss: none.      [facility]

## 2022-09-06 NOTE — Anesthesia Procedure Notes (Signed)
Date/Time: 09/06/2022 9:42 AM  Performed by: Doreen Salvage, CRNAPre-anesthesia Checklist: Patient identified, Emergency Drugs available, Suction available and Patient being monitored Patient Re-evaluated:Patient Re-evaluated prior to induction Oxygen Delivery Method: Nasal cannula Induction Type: IV induction Dental Injury: Teeth and Oropharynx as per pre-operative assessment  Comments: Nasal cannula with etCO2 monitoring

## 2022-09-06 NOTE — Anesthesia Preprocedure Evaluation (Signed)
Anesthesia Evaluation  Patient identified by MRN, date of birth, ID band Patient awake    Reviewed: Allergy & Precautions, NPO status , Patient's Chart, lab work & pertinent test results  Airway Mallampati: III  TM Distance: >3 FB Neck ROM: Full    Dental  (+) Edentulous Upper, Edentulous Lower   Pulmonary neg pulmonary ROS, shortness of breath and with exertion, COPD, Current Smoker and Patient abstained from smoking.,    Pulmonary exam normal  + decreased breath sounds      Cardiovascular Exercise Tolerance: Poor hypertension, Pt. on medications + Past MI  negative cardio ROS Normal cardiovascular exam+ dysrhythmias  Rhythm:Regular Rate:Normal     Neuro/Psych Seizures -,  Anxiety Depression CVA, No Residual Symptoms negative neurological ROS  negative psych ROS   GI/Hepatic negative GI ROS, Neg liver ROS, GERD  Medicated,  Endo/Other  negative endocrine ROSMorbid obesity  Renal/GU negative Renal ROS  negative genitourinary   Musculoskeletal  (+) Arthritis ,   Abdominal   Peds negative pediatric ROS (+)  Hematology negative hematology ROS (+)   Anesthesia Other Findings Past Medical History: No date: Anxiety No date: Arthritis No date: Bronchitis 05/19/2020: Chronic bilateral low back pain with bilateral sciatica No date: Complication of anesthesia     Comment:  vomitting No date: Dysrhythmia     Comment:  hx of flutter, no longer present No date: Elevated lipids No date: GERD (gastroesophageal reflux disease)     Comment:  does not take meds but has gerd pretty bad No date: Hepatitis     Comment:  Hep C - treated with Harvoni No date: HIV (human immunodeficiency virus infection) ([place]) No date: HOH (hard of hearing) No date: Hypertension 1993: Myocardial infarction ([place]) No date: Neuropathy No date: Seizures ([place])     Comment:  not considered seizures but does "black out" and unsure               of  what happened. happens when she overheats. last               episode 4 days ago. No date: Stroke [facility])     Comment:  no defecits  Past Surgical History: 1970: ABDOMINAL HYSTERECTOMY No date: BACK SURGERY     Comment:  no metal 06/21/2017: CATARACT EXTRACTION W/PHACO; Right     Comment:  Procedure: CATARACT EXTRACTION PHACO AND INTRAOCULAR               LENS PLACEMENT (IOC);  Surgeon: Birder Robson, MD;                Location: [facility] ORS;  Service: Ophthalmology;  Laterality:              Right;  [place] 00:30.9 AP% 12.7 CDE 3.94 Fluid Pack Lot #               [id number] 07/12/2017: CATARACT EXTRACTION W/PHACO; Left     Comment:  Procedure: CATARACT EXTRACTION PHACO AND INTRAOCULAR               LENS PLACEMENT (IOC);  Surgeon: Birder Robson, MD;                Location: [facility] ORS;  Service: Ophthalmology;  Laterality:              Left;  [place]  00:42 AP% 15.2 CDE 6.38 Fluid pack lot #               [id number] 12/09/2020: COLONOSCOPY WITH PROPOFOL;  N/A     Comment:  Procedure: COLONOSCOPY WITH PROPOFOL;  Surgeon: Jonathon Bellows, MD;  Location: [facility] ENDOSCOPY;  Service:               Gastroenterology;  Laterality: N/A; No date: DIAGNOSTIC LAPAROSCOPY No date: FRACTURE SURGERY; Left     Comment:  wrist 2010: HERNIA REPAIR     Comment:  umbilical No date: JOINT REPLACEMENT; Bilateral 12/30/2016: KNEE CLOSED REDUCTION; Left     Comment:  Procedure: CLOSED MANIPULATION KNEE;  Surgeon: Hessie Knows, MD;  Location: [facility] ORS;  Service: Orthopedics;                Laterality: Left; No date: LAPAROTOMY No date: TONSILLECTOMY 07/15/2016: TOTAL KNEE ARTHROPLASTY; Right     Comment:  Procedure: TOTAL KNEE ARTHROPLASTY;  Surgeon: Hessie Knows, MD;  Location: [facility] ORS;  Service: Orthopedics;                Laterality: Right; 10/26/2016: TOTAL KNEE ARTHROPLASTY; Left     Comment:  Procedure: TOTAL KNEE ARTHROPLASTY;  Surgeon: Hessie Knows, MD;   Location: [facility] ORS;  Service: Orthopedics;                Laterality: Left;  BMI    Body Mass Index: 38.97 kg/m      Reproductive/Obstetrics negative OB ROS                             Anesthesia Physical Anesthesia Plan  ASA: 3  Anesthesia Plan: General   Post-op Pain Management:    Induction: Intravenous  PONV Risk Score and Plan: Propofol infusion and TIVA  Airway Management Planned: Natural Airway  Additional Equipment:   Intra-op Plan:   Post-operative Plan:   Informed Consent: I have reviewed the patients History and Physical, chart, labs and discussed the procedure including the risks, benefits and alternatives for the proposed anesthesia with the patient or authorized representative who has indicated his/her understanding and acceptance.     Dental Advisory Given  Plan Discussed with: CRNA and Surgeon  Anesthesia Plan Comments:         Anesthesia Quick Evaluation

## 2022-09-07 ENCOUNTER — Encounter: Payer: Self-pay | Admitting: Gastroenterology

## 2022-10-01 ENCOUNTER — Other Ambulatory Visit: Payer: Self-pay | Admitting: Nurse Practitioner

## 2022-10-01 DIAGNOSIS — Z1231 Encounter for screening mammogram for malignant neoplasm of breast: Secondary | ICD-10-CM

## 2022-12-21 ENCOUNTER — Other Ambulatory Visit: Payer: Self-pay | Admitting: Family Medicine

## 2022-12-21 DIAGNOSIS — M5412 Radiculopathy, cervical region: Secondary | ICD-10-CM

## 2023-01-03 ENCOUNTER — Ambulatory Visit
Admission: RE | Admit: 2023-01-03 | Discharge: 2023-01-03 | Disposition: A | Discharge status: Home / Self Care | Payer: Medicare HMO | Source: Ambulatory Visit | Attending: Family Medicine | Admitting: Family Medicine

## 2023-01-03 DIAGNOSIS — M5412 Radiculopathy, cervical region: Secondary | ICD-10-CM

## 2023-01-05 ENCOUNTER — Telehealth: Payer: Self-pay

## 2023-01-05 NOTE — Telephone Encounter (Signed)
-----   Message from [place] sent at 01/05/2023  2:24 PM EST ----- Regarding: STAT referral from [facility]. Sharlet Salina' office called today to let us know that they sent over a STAT referral for Lisa Hawkins. I have entered the referral [phone] and I have left her a vm to have her call us back to schedule.

## 2023-01-05 NOTE — Telephone Encounter (Signed)
Dr Izora Ribas is out of the office this week. Danielle reviewed her notes and MRI.  Put on Stacy's schedule Tuesday for her to evaluate the patient while Dr Izora Ribas is here in clinic.  Andee Poles will discuss with Dr Izora Ribas.

## 2023-01-06 NOTE — Telephone Encounter (Signed)
Scheduled the appt for Tuesday 2/13 w/o talking to patient. I have called and left 2 vm now with no response. If patient calls back, I'll confirm appt or reschedule as needed.

## 2023-01-07 NOTE — Progress Notes (Deleted)
Referring Physician:  Center, [facility] [place],  [place] [address]  Primary Physician:  Center, [place]  History of Present Illness: 01/07/2023*** [name] has a history of HTN, hep C, renal disease, CVA, history of crack/cocaine use, and HIV.   Hospitalized April 2023 for suicidal ideation and intentional overdose.    Duration: *** Location: *** Quality: *** Severity: ***  Precipitating: aggravated by *** Modifying factors: made better by *** Weakness: none Timing: *** Bowel/Bladder Dysfunction: none  Conservative measures:  Physical therapy: ***  Multimodal medical therapy including regular antiinflammatories: ***  Injections:  03/19/2020: Bilateral S1 transforaminal ESI (ODI: 74%)  Past Surgery:  Back surgery in 1993 Aborted back surgery?***  Lisa Hawkins has ***no symptoms of cervical myelopathy.  The symptoms are causing a significant impact on the patient's life.   Review of Systems:  A 10 point review of systems is negative, except for the pertinent positives and negatives detailed in the HPI.  Past Medical History: Past Medical History:  Diagnosis Date   Anxiety    Arthritis    Bronchitis    Chronic bilateral low back pain with bilateral sciatica 123456   Complication of anesthesia    vomitting   Dysrhythmia    hx of flutter, no longer present   Elevated lipids    GERD (gastroesophageal reflux disease)    does not take meds but has gerd pretty bad   Hepatitis    Hep C - treated with Harvoni   HIV (human immunodeficiency virus infection) ([place])    HOH (hard of hearing)    Hypertension    Myocardial infarction ([place]) 1993   Neuropathy    Seizures ([place])    not considered seizures but does "black out" and unsure of what happened. happens when she overheats. last episode 4 days ago.   Stroke ([place])    no defecits    Past Surgical History: Past Surgical History:  Procedure  Laterality Date   ABDOMINAL HYSTERECTOMY  1970   BACK SURGERY     no metal   CATARACT EXTRACTION W/PHACO Right 06/21/2017   Procedure: CATARACT EXTRACTION PHACO AND INTRAOCULAR LENS PLACEMENT (IOC);  Surgeon: Birder Robson, MD;  Location: [facility] ORS;  Service: Ophthalmology;  Laterality: Right;  [place] 00:30.9 AP% 12.7 CDE 3.94 Fluid Pack Lot # [id number]   CATARACT EXTRACTION W/PHACO Left 07/12/2017   Procedure: CATARACT EXTRACTION PHACO AND INTRAOCULAR LENS PLACEMENT (IOC);  Surgeon: Birder Robson, MD;  Location: [facility] ORS;  Service: Ophthalmology;  Laterality: Left;  [place]  00:42 AP% 15.2 CDE 6.38 Fluid pack lot # [id number]   COLONOSCOPY WITH PROPOFOL N/A 12/09/2020   Procedure: COLONOSCOPY WITH PROPOFOL;  Surgeon: Jonathon Bellows, MD;  Location: [facility] ENDOSCOPY;  Service: Gastroenterology;  Laterality: N/A;   COLONOSCOPY WITH PROPOFOL N/A 09/06/2022   Procedure: COLONOSCOPY WITH PROPOFOL;  Surgeon: Jonathon Bellows, MD;  Location: [facility] ENDOSCOPY;  Service: Gastroenterology;  Laterality: N/A;   DIAGNOSTIC LAPAROSCOPY     FRACTURE SURGERY Left    wrist   HERNIA REPAIR  [id number]   umbilical   JOINT REPLACEMENT Bilateral    KNEE CLOSED REDUCTION Left 12/30/2016   Procedure: CLOSED MANIPULATION KNEE;  Surgeon: Hessie Knows, MD;  Location: [facility] ORS;  Service: Orthopedics;  Laterality: Left;   LAPAROTOMY     TONSILLECTOMY     TOTAL KNEE ARTHROPLASTY Right 07/15/2016   Procedure: TOTAL KNEE ARTHROPLASTY;  Surgeon: Hessie Knows, MD;  Location: [facility] ORS;  Service: Orthopedics;  Laterality: Right;  TOTAL KNEE ARTHROPLASTY Left 10/26/2016   Procedure: TOTAL KNEE ARTHROPLASTY;  Surgeon: Hessie Knows, MD;  Location: [facility] ORS;  Service: Orthopedics;  Laterality: Left;    Allergies: Allergies as of 01/11/2023 - Review Complete 09/06/2022  Allergen Reaction Noted   Penicillins Swelling and Rash 06/15/2016    Medications: Outpatient Encounter Medications as of 01/11/2023  Medication Sig    abacavir-dolutegravir-lamiVUDine (TRIUMEQ) 600-50-300 MG tablet Take 1 tablet by mouth daily.   amitriptyline (ELAVIL) 100 MG tablet Take 1 tablet (100 mg total) by mouth at bedtime.   amLODipine (NORVASC) 10 MG tablet Take 1 tablet (10 mg total) by mouth daily.   Aspirin 81 MG CAPS Take 1 capsule by mouth daily.   atorvastatin (LIPITOR) 40 MG tablet Take 40 mg by mouth at bedtime.   celecoxib (CELEBREX) 100 MG capsule Take 1 capsule (100 mg total) by mouth 2 (two) times daily.   FLUoxetine (PROZAC) 20 MG capsule Take 1 capsule (20 mg total) by mouth daily.   hydrochlorothiazide (HYDRODIURIL) 25 MG tablet Take 1 tablet (25 mg total) by mouth daily.   NICODERM CQ 21 MG/24HR patch 21 mg daily.   nicotine (NICODERM CQ) 21 mg/24hr patch Place 1 patch (21 mg total) onto the skin daily.   pantoprazole (PROTONIX) 20 MG tablet Take 3 tablets (60 mg total) by mouth daily.   polyethylene glycol-electrolytes (NULYTELY) 420 g solution At 5pm evening before colonoscopy-Fill Nulytely container to the fill line with clear liquid.  Mix well.  Drink 8 oz every 20 mins until half contents have been completed. Continue clear liquid diet.  5 hours prior to colonoscopy resume drinking the remaining Nulytely prep drinking 8 oz every 20 mins until entire contents have been completed. So not eat or drink anything 2 hours prior to colonsocopy.   potassium chloride SA (KLOR-CON M) 20 MEQ tablet Take 1 tablet (20 mEq total) by mouth daily.   risperiDONE (RISPERDAL) 0.5 MG tablet Take 1 tablet (0.5 mg total) by mouth 2 (two) times daily at 8 am and 4 pm.   traZODone (DESYREL) 100 MG tablet Take 1 tablet (100 mg total) by mouth at bedtime as needed for sleep.   No facility-administered encounter medications on file as of 01/11/2023.    Social History: Social History   Tobacco Use   Smoking status: Every Day    Packs/day: 1.00    Years: 45.00    Total pack years: 45.00    Types: Cigarettes   Smokeless tobacco: Never   Vaping Use   Vaping Use: Never used  Substance Use Topics   Alcohol use: No   Drug use: No    Family Medical History: Family History  Problem Relation Age of Onset   Hypertension Father     Physical Examination: There were no vitals filed for this visit.  General: Patient is well developed, well nourished, calm, collected, and in no apparent distress. Attention to examination is appropriate.  Respiratory: Patient is breathing without any difficulty.   NEUROLOGICAL:     Awake, alert, oriented to person, place, and time.  Speech is clear and fluent. Fund of knowledge is appropriate.   Cranial Nerves: Pupils equal round and reactive to light.  Facial tone is symmetric.    *** ROM of cervical spine *** pain *** posterior cervical tenderness. *** tenderness in bilateral trapezial region.   *** ROM of lumbar spine *** pain *** posterior lumbar tenderness.   No abnormal lesions on exposed skin.   Strength: Side Biceps Triceps  Deltoid Interossei Grip Wrist Ext. Wrist Flex.  R 5 5 5 5 5 5 5  $ L 5 5 5 5 5 5 5   $ Side Iliopsoas Quads Hamstring PF DF EHL  R 5 5 5 5 5 5  $ L 5 5 5 5 5 5   $ Reflexes are ***2+ and symmetric at the biceps, triceps, brachioradialis, patella and achilles.   Hoffman's is absent.  Clonus is not present.   Bilateral upper and lower extremity sensation is intact to light touch.     Gait is normal.   ***No difficulty with tandem gait.    Medical Decision Making  Imaging: Cervical MRI dated 01/03/23:  FINDINGS: Despite efforts by the technologist and patient, motion artifact is present on today's exam and could not be eliminated. This reduces exam sensitivity and specificity.   Alignment: 1.5 mm degenerative retrolisthesis at C3-4.   Vertebrae: Disc desiccation and loss of disc height at all cervical vertebral levels, with multilevel degenerative endplate findings.   Cord: Equivocal accentuated T2 signal in the cord at the C4-5 level probably  attributable to central narrowing of the thecal sac. Reduced accuracy of assessment of the cord due to motion artifact.   Posterior Fossa, vertebral arteries, paraspinal tissues: Central pontine accentuated T2 signal compatible with chronic ischemic microvascular white matter disease.   Disc levels:   C2-3: No impingement. Disc bulge and uncinate spurring. Left facet arthropathy.   C3-4: Prominent right and moderate left foraminal stenosis and moderate central narrowing of the thecal sac due to disc osteophyte complex, uncinate spurring, and facet arthropathy.   C4-5: Prominent central narrowing of the thecal sac and prominent bilateral foraminal stenosis due to disc osteophyte complex, facet arthropathy, and uncinate spurring.   C5-6: Moderate to prominent left and moderate right foraminal stenosis and mild central narrowing of the thecal sac due to central disc protrusion, facet arthropathy, and uncinate spurring.   C6-7: Moderate central narrowing of the thecal sac and mild right foraminal stenosis due to disc bulge and uncinate spurring.   C7-T1: Moderate left and mild right foraminal stenosis due to disc bulge, right uncinate spurring, and mild facet arthropathy.   T1-2: Moderate bilateral foraminal stenosis due to facet arthropathy and uncinate spurring. This level is only included on the parasagittal images.   IMPRESSION: 1. Cervical spondylosis and degenerative disc disease, causing prominent impingement at C3-4 and C4-5; moderate to prominent impingement at C5-6; moderate impingement at C6-7, C7-T1, and T1-2. 2. Equivocal accentuated T2 signal in the cord at the C4-5 level probably reflecting cord edema or myelomalacia attributable to central narrowing of the thecal sac. Reduced accuracy of cord assessment due to motion artifact. 3. Chronic ischemic microvascular white matter disease involving the pons.     Electronically Signed   By: Van Clines M.D.    On: 01/04/2023 10:50     I have personally reviewed the images and agree with the above interpretation.  Assessment and Plan: Ms. Bullins is a pleasant 72 y.o. female has ***  Treatment options discussed with patient and following plan made:   - Order for physical therapy for *** spine ***. Patient to call to schedule appointment. *** - Continue current medications including ***. Reviewed dosing and side effects.  - Prescription for ***. Reviewed dosing and side effects. Take with food.  - Prescription for *** to take prn muscle spasms. Reviewed dosing and side effects. Discussed this can cause drowsiness.  - MRI of *** to further evaluate *** radiculopathy. No improvement  time or medications (***).  - Referral to PMR at [facility] to discuss possible *** injections.  - Will schedule phone visit to review MRI results once I get them back.   I spent a total of *** minutes in face-to-face and non-face-to-face activities related to this patient's care today including review of outside records, review of imaging, review of symptoms, physical exam, discussion of differential diagnosis, discussion of treatment options, and documentation.   Thank you for involving me in the care of this patient.   Geronimo Boot PA-C Dept. of Neurosurgery

## 2023-01-11 ENCOUNTER — Ambulatory Visit: Payer: Medicare HMO | Admitting: Orthopedic Surgery

## 2023-01-25 ENCOUNTER — Other Ambulatory Visit: Payer: Self-pay | Admitting: Family Medicine

## 2023-01-25 DIAGNOSIS — K7460 Unspecified cirrhosis of liver: Secondary | ICD-10-CM

## 2023-01-28 NOTE — Progress Notes (Deleted)
Referring Physician:  Sharlet Salina, MD [place],  [place] [address]  Primary Physician:  [facility], [place]  History of Present Illness: 01/28/2023*** [name] has a history of HTN, hep C, renal disease, CVA, history of crack/cocaine use, and HIV.   Hospitalized April 2023 for suicidal ideation and intentional overdose.    Duration: *** Location: *** Quality: *** Severity: ***  Precipitating: aggravated by *** Modifying factors: made better by *** Weakness: none Timing: *** Bowel/Bladder Dysfunction: none  Conservative measures:  Physical therapy: ***  Multimodal medical therapy including regular antiinflammatories: ***  Injections:  03/19/2020: Bilateral S1 transforaminal ESI (ODI: 74%)  Past Surgery:  Back surgery in 1993 Aborted back surgery?***  Lisa Hawkins has ***no symptoms of cervical myelopathy.  The symptoms are causing a significant impact on the patient's life.   Review of Systems:  A 10 point review of systems is negative, except for the pertinent positives and negatives detailed in the HPI.  Past Medical History: Past Medical History:  Diagnosis Date   Anxiety    Arthritis    Bronchitis    Chronic bilateral low back pain with bilateral sciatica 123456   Complication of anesthesia    vomitting   Dysrhythmia    hx of flutter, no longer present   Elevated lipids    GERD (gastroesophageal reflux disease)    does not take meds but has gerd pretty bad   Hepatitis    Hep C - treated with Harvoni   HIV (human immunodeficiency virus infection) ([place])    HOH (hard of hearing)    Hypertension    Myocardial infarction ([place]) 1993   Neuropathy    Seizures ([place])    not considered seizures but does "black out" and unsure of what happened. happens when she overheats. last episode 4 days ago.   Stroke ([place])    no defecits    Past Surgical History: Past Surgical History:  Procedure Laterality  Date   ABDOMINAL HYSTERECTOMY  1970   BACK SURGERY     no metal   CATARACT EXTRACTION W/PHACO Right 06/21/2017   Procedure: CATARACT EXTRACTION PHACO AND INTRAOCULAR LENS PLACEMENT (IOC);  Surgeon: Birder Robson, MD;  Location: [facility] ORS;  Service: Ophthalmology;  Laterality: Right;  [place] 00:30.9 AP% 12.7 CDE 3.94 Fluid Pack Lot # [id number]   CATARACT EXTRACTION W/PHACO Left 07/12/2017   Procedure: CATARACT EXTRACTION PHACO AND INTRAOCULAR LENS PLACEMENT (IOC);  Surgeon: Birder Robson, MD;  Location: [facility] ORS;  Service: Ophthalmology;  Laterality: Left;  [place]  00:42 AP% 15.2 CDE 6.38 Fluid pack lot # [id number]   COLONOSCOPY WITH PROPOFOL N/A 12/09/2020   Procedure: COLONOSCOPY WITH PROPOFOL;  Surgeon: Jonathon Bellows, MD;  Location[facility] ENDOSCOPY;  Service: Gastroenterology;  Laterality: N/A;   COLONOSCOPY WITH PROPOFOL N/A 09/06/2022   Procedure: COLONOSCOPY WITH PROPOFOL;  Surgeon: Jonathon Bellows, MD;  Location: [facility] ENDOSCOPY;  Service: Gastroenterology;  Laterality: N/A;   DIAGNOSTIC LAPAROSCOPY     FRACTURE SURGERY Left    wrist   HERNIA REPAIR  [id number]   umbilical   JOINT REPLACEMENT Bilateral    KNEE CLOSED REDUCTION Left 12/30/2016   Procedure: CLOSED MANIPULATION KNEE;  Surgeon: Hessie Knows, MD;  Location: [facility] ORS;  Service: Orthopedics;  Laterality: Left;   LAPAROTOMY     TONSILLECTOMY     TOTAL KNEE ARTHROPLASTY Right 07/15/2016   Procedure: TOTAL KNEE ARTHROPLASTY;  Surgeon: Hessie Knows, MD;  Location: [facility] ORS;  Service: Orthopedics;  Laterality: Right;  TOTAL KNEE ARTHROPLASTY Left 10/26/2016   Procedure: TOTAL KNEE ARTHROPLASTY;  Surgeon: Hessie Knows, MD;  Location: [facility] ORS;  Service: Orthopedics;  Laterality: Left;    Allergies: Allergies as of 02/01/2023 - Review Complete 09/06/2022  Allergen Reaction Noted   Penicillins Swelling and Rash 06/15/2016    Medications: Outpatient Encounter Medications as of 02/01/2023  Medication Sig   abacavir-dolutegravir-lamiVUDine  (TRIUMEQ) 600-50-300 MG tablet Take 1 tablet by mouth daily.   amitriptyline (ELAVIL) 100 MG tablet Take 1 tablet (100 mg total) by mouth at bedtime.   amLODipine (NORVASC) 10 MG tablet Take 1 tablet (10 mg total) by mouth daily.   Aspirin 81 MG CAPS Take 1 capsule by mouth daily.   atorvastatin (LIPITOR) 40 MG tablet Take 40 mg by mouth at bedtime.   celecoxib (CELEBREX) 100 MG capsule Take 1 capsule (100 mg total) by mouth 2 (two) times daily.   FLUoxetine (PROZAC) 20 MG capsule Take 1 capsule (20 mg total) by mouth daily.   hydrochlorothiazide (HYDRODIURIL) 25 MG tablet Take 1 tablet (25 mg total) by mouth daily.   NICODERM CQ 21 MG/24HR patch 21 mg daily.   nicotine (NICODERM CQ) 21 mg/24hr patch Place 1 patch (21 mg total) onto the skin daily.   pantoprazole (PROTONIX) 20 MG tablet Take 3 tablets (60 mg total) by mouth daily.   polyethylene glycol-electrolytes (NULYTELY) 420 g solution At 5pm evening before colonoscopy-Fill Nulytely container to the fill line with clear liquid.  Mix well.  Drink 8 oz every 20 mins until half contents have been completed. Continue clear liquid diet.  5 hours prior to colonoscopy resume drinking the remaining Nulytely prep drinking 8 oz every 20 mins until entire contents have been completed. So not eat or drink anything 2 hours prior to colonsocopy.   potassium chloride SA (KLOR-CON M) 20 MEQ tablet Take 1 tablet (20 mEq total) by mouth daily.   risperiDONE (RISPERDAL) 0.5 MG tablet Take 1 tablet (0.5 mg total) by mouth 2 (two) times daily at 8 am and 4 pm.   traZODone (DESYREL) 100 MG tablet Take 1 tablet (100 mg total) by mouth at bedtime as needed for sleep.   No facility-administered encounter medications on file as of 02/01/2023.    Social History: Social History   Tobacco Use   Smoking status: Every Day    Packs/day: 1.00    Years: 45.00    Total pack years: 45.00    Types: Cigarettes   Smokeless tobacco: Never  Vaping Use   Vaping Use: Never  used  Substance Use Topics   Alcohol use: No   Drug use: No    Family Medical History: Family History  Problem Relation Age of Onset   Hypertension Father     Physical Examination: There were no vitals filed for this visit.  General: Patient is well developed, well nourished, calm, collected, and in no apparent distress. Attention to examination is appropriate.  Respiratory: Patient is breathing without any difficulty.   NEUROLOGICAL:     Awake, alert, oriented to person, place, and time.  Speech is clear and fluent. Fund of knowledge is appropriate.   Cranial Nerves: Pupils equal round and reactive to light.  Facial tone is symmetric.    *** ROM of cervical spine *** pain *** posterior cervical tenderness. *** tenderness in bilateral trapezial region.   *** ROM of lumbar spine *** pain *** posterior lumbar tenderness.   No abnormal lesions on exposed skin.   Strength: Side Biceps Triceps  Deltoid Interossei Grip Wrist Ext. Wrist Flex.  R '5 5 5 5 5 5 5  '$ L '5 5 5 5 5 5 5   '$ Side Iliopsoas Quads Hamstring PF DF EHL  R '5 5 5 5 5 5  '$ L '5 5 5 5 5 5   '$ Reflexes are ***2+ and symmetric at the biceps, triceps, brachioradialis, patella and achilles.   Hoffman's is absent.  Clonus is not present.   Bilateral upper and lower extremity sensation is intact to light touch.     Gait is normal.   ***No difficulty with tandem gait.    Medical Decision Making  Imaging: Cervical MRI dated 01/03/23:  FINDINGS: Despite efforts by the technologist and patient, motion artifact is present on today's exam and could not be eliminated. This reduces exam sensitivity and specificity.   Alignment: 1.5 mm degenerative retrolisthesis at C3-4.   Vertebrae: Disc desiccation and loss of disc height at all cervical vertebral levels, with multilevel degenerative endplate findings.   Cord: Equivocal accentuated T2 signal in the cord at the C4-5 level probably attributable to central narrowing of  the thecal sac. Reduced accuracy of assessment of the cord due to motion artifact.   Posterior Fossa, vertebral arteries, paraspinal tissues: Central pontine accentuated T2 signal compatible with chronic ischemic microvascular white matter disease.   Disc levels:   C2-3: No impingement. Disc bulge and uncinate spurring. Left facet arthropathy.   C3-4: Prominent right and moderate left foraminal stenosis and moderate central narrowing of the thecal sac due to disc osteophyte complex, uncinate spurring, and facet arthropathy.   C4-5: Prominent central narrowing of the thecal sac and prominent bilateral foraminal stenosis due to disc osteophyte complex, facet arthropathy, and uncinate spurring.   C5-6: Moderate to prominent left and moderate right foraminal stenosis and mild central narrowing of the thecal sac due to central disc protrusion, facet arthropathy, and uncinate spurring.   C6-7: Moderate central narrowing of the thecal sac and mild right foraminal stenosis due to disc bulge and uncinate spurring.   C7-T1: Moderate left and mild right foraminal stenosis due to disc bulge, right uncinate spurring, and mild facet arthropathy.   T1-2: Moderate bilateral foraminal stenosis due to facet arthropathy and uncinate spurring. This level is only included on the parasagittal images.   IMPRESSION: 1. Cervical spondylosis and degenerative disc disease, causing prominent impingement at C3-4 and C4-5; moderate to prominent impingement at C5-6; moderate impingement at C6-7, C7-T1, and T1-2. 2. Equivocal accentuated T2 signal in the cord at the C4-5 level probably reflecting cord edema or myelomalacia attributable to central narrowing of the thecal sac. Reduced accuracy of cord assessment due to motion artifact. 3. Chronic ischemic microvascular white matter disease involving the pons.     Electronically Signed   By: Van Clines M.D.   On: 01/04/2023 10:50     I have  personally reviewed the images and agree with the above interpretation.  Assessment and Plan: Lisa Hawkins is a pleasant 72 y.o. female has ***  Treatment options discussed with patient and following plan made:   - Order for physical therapy for *** spine ***. Patient to call to schedule appointment. *** - Continue current medications including ***. Reviewed dosing and side effects.  - Prescription for ***. Reviewed dosing and side effects. Take with food.  - Prescription for *** to take prn muscle spasms. Reviewed dosing and side effects. Discussed this can cause drowsiness.  - MRI of *** to further evaluate *** radiculopathy. No improvement  time or medications (***).  - Referral to PMR at [facility] to discuss possible *** injections.  - Will schedule phone visit to review MRI results once I get them back.   I spent a total of *** minutes in face-to-face and non-face-to-face activities related to this patient's care today including review of outside records, review of imaging, review of symptoms, physical exam, discussion of differential diagnosis, discussion of treatment options, and documentation.   Thank you for involving me in the care of this patient.   Geronimo Boot PA-C Dept. of Neurosurgery

## 2023-02-01 ENCOUNTER — Ambulatory Visit: Payer: Medicare HMO | Admitting: Orthopedic Surgery

## 2023-02-01 NOTE — Progress Notes (Signed)
Referring Physician:  Sharlet Salina, MD [place],  [place] [id number]  Primary Physician:  [facility] [place]  History of Present Illness: 02/08/2023 Lisa Hawkins has a history of HTN, hep C, renal disease, CVA, history of crack/cocaine use, and HIV.   Hospitalized April 2023 for suicidal ideation and intentional overdose.   She has 6 month history constant neck pain with radiation to her left arm into her hand (entire hand). No right arm pain. She weakness in left arm and issues with dexterity. She notes balance issues with frequent falling. She has numbness and tingling in left arm as well. Pain is worse with cold weather.   She also has chronic constant LBP with constant left leg pain (entire leg) to her foot. No right leg pain. She has weakness in left leg along with numbness and tingling.   She smokes 1 pack per day (does not smoke when she uses nicotine patches). She continues to use cocaine a few times a month. Last used on Friday 02/04/23.   She is compliant with her HIV medications.   She has some urinary urgency and some recent diarrhea. No incontinence noted.   Conservative measures:  Physical therapy: has not participated  Multimodal medical therapy including regular antiinflammatories: gabapentin, celebrex, ibuprofen  Injections:  03/19/2020: Bilateral S1 transforaminal ESI (ODI: 74%)  Past Surgery:  Back surgery in 1993 Aborted back surgery? (Had to stop due to scar tissue per patient)  Lisa Hawkins has symptoms of cervical myelopathy.  The symptoms are causing a significant impact on the patient's life.   Review of Systems:  A 10 point review of systems is negative, except for the pertinent positives and negatives detailed in the HPI.  Past Medical History: Past Medical History:  Diagnosis Date   Anxiety    Arthritis    Bronchitis    Chronic bilateral low back pain with bilateral sciatica 123456    Complication of anesthesia    vomitting   Dysrhythmia    hx of flutter, no longer present   Elevated lipids    GERD (gastroesophageal reflux disease)    does not take meds but has gerd pretty bad   Hepatitis    Hep C - treated with Harvoni   HIV (human immunodeficiency virus infection) ([place])    HOH (hard of hearing)    Hypertension    Myocardial infarction ([place]) 1993   Neuropathy    Seizures ([place])    not considered seizures but does "black out" and unsure of what happened. happens when she overheats. last episode 4 days ago.   Stroke ([place])    no defecits    Past Surgical History: Past Surgical History:  Procedure Laterality Date   ABDOMINAL HYSTERECTOMY  1970   BACK SURGERY     no metal   CATARACT EXTRACTION W/PHACO Right 06/21/2017   Procedure: CATARACT EXTRACTION PHACO AND INTRAOCULAR LENS PLACEMENT (IOC);  Surgeon: Birder Robson, MD;  Location: [facility] ORS;  Service: Ophthalmology;  Laterality: Right;  [place] 00:30.9 AP% 12.7 CDE 3.94 Fluid Pack Lot # [id number]   CATARACT EXTRACTION W/PHACO Left 07/12/2017   Procedure: CATARACT EXTRACTION PHACO AND INTRAOCULAR LENS PLACEMENT (IOC);  Surgeon: Birder Robson, MD;  Location: [facility] ORS;  Service: Ophthalmology;  Laterality: Left;  [place]  00:42 AP% 15.2 CDE 6.38 Fluid pack lot # [id number]   COLONOSCOPY WITH PROPOFOL N/A 12/09/2020   Procedure: COLONOSCOPY WITH PROPOFOL;  Surgeon: Jonathon Bellows, MD;  Location: [facility] ENDOSCOPY;  Service:  Gastroenterology;  Laterality: N/A;   COLONOSCOPY WITH PROPOFOL N/A 09/06/2022   Procedure: COLONOSCOPY WITH PROPOFOL;  Surgeon: Jonathon Bellows, MD;  Location[facility] ENDOSCOPY;  Service: Gastroenterology;  Laterality: N/A;   DIAGNOSTIC LAPAROSCOPY     FRACTURE SURGERY Left    wrist   HERNIA REPAIR  [id number]   umbilical   JOINT REPLACEMENT Bilateral    KNEE CLOSED REDUCTION Left 12/30/2016   Procedure: CLOSED MANIPULATION KNEE;  Surgeon: Hessie Knows, MD;  Location: [facility] ORS;  Service: Orthopedics;  Laterality:  Left;   LAPAROTOMY     TONSILLECTOMY     TOTAL KNEE ARTHROPLASTY Right 07/15/2016   Procedure: TOTAL KNEE ARTHROPLASTY;  Surgeon: Hessie Knows, MD;  Location: [facility] ORS;  Service: Orthopedics;  Laterality: Right;   TOTAL KNEE ARTHROPLASTY Left 10/26/2016   Procedure: TOTAL KNEE ARTHROPLASTY;  Surgeon: Hessie Knows, MD;  Location: [facility] ORS;  Service: Orthopedics;  Laterality: Left;    Allergies: Allergies as of 02/08/2023 - Review Complete 02/08/2023  Allergen Reaction Noted   Penicillins Swelling and Rash 06/15/2016    Medications: Outpatient Encounter Medications as of 02/08/2023  Medication Sig   metFORMIN (GLUCOPHAGE-XR) 500 MG 24 hr tablet Take 500 mg by mouth daily with breakfast.   abacavir-dolutegravir-lamiVUDine (TRIUMEQ) 600-50-300 MG tablet Take 1 tablet by mouth daily.   amitriptyline (ELAVIL) 100 MG tablet Take 1 tablet (100 mg total) by mouth at bedtime.   amLODipine (NORVASC) 10 MG tablet Take 1 tablet (10 mg total) by mouth daily.   Aspirin 81 MG CAPS Take 1 capsule by mouth daily.   atorvastatin (LIPITOR) 40 MG tablet Take 40 mg by mouth at bedtime.   celecoxib (CELEBREX) 100 MG capsule Take 1 capsule (100 mg total) by mouth 2 (two) times daily.   FLUoxetine (PROZAC) 20 MG capsule Take 1 capsule (20 mg total) by mouth daily.   gabapentin (NEURONTIN) 300 MG capsule Take 300 mg by mouth 3 (three) times daily.   hydrochlorothiazide (HYDRODIURIL) 25 MG tablet Take 1 tablet (25 mg total) by mouth daily.   NICODERM CQ 21 MG/24HR patch 21 mg daily.   nicotine (NICODERM CQ) 21 mg/24hr patch Place 1 patch (21 mg total) onto the skin daily.   pantoprazole (PROTONIX) 20 MG tablet Take 3 tablets (60 mg total) by mouth daily.   polyethylene glycol-electrolytes (NULYTELY) 420 g solution At 5pm evening before colonoscopy-Fill Nulytely container to the fill line with clear liquid.  Mix well.  Drink 8 oz every 20 mins until half contents have been completed. Continue clear liquid diet.  5  hours prior to colonoscopy resume drinking the remaining Nulytely prep drinking 8 oz every 20 mins until entire contents have been completed. So not eat or drink anything 2 hours prior to colonsocopy.   potassium chloride SA (KLOR-CON M) 20 MEQ tablet Take 1 tablet (20 mEq total) by mouth daily.   risperiDONE (RISPERDAL) 0.5 MG tablet Take 1 tablet (0.5 mg total) by mouth 2 (two) times daily at 8 am and 4 pm.   traZODone (DESYREL) 100 MG tablet Take 1 tablet (100 mg total) by mouth at bedtime as needed for sleep.   No facility-administered encounter medications on file as of 02/08/2023.    Social History: Social History   Tobacco Use   Smoking status: Every Day    Packs/day: 1.00    Years: 45.00    Total pack years: 45.00    Types: Cigarettes   Smokeless tobacco: Never  Vaping Use   Vaping Use: Never used  Substance Use Topics   Alcohol use: No   Drug use: No    Family Medical History: Family History  Problem Relation Age of Onset   Hypertension Father     Physical Examination: Vitals:   02/08/23 1258  BP: 104/60    General: Patient is well developed, well nourished, calm, collected, and in no apparent distress. Attention to examination is appropriate.  Respiratory: Patient is breathing without any difficulty.   NEUROLOGICAL:     Awake, alert, oriented to person, place, and time.  Speech is clear and fluent. Fund of knowledge is appropriate.   Cranial Nerves: Pupils equal round and reactive to light.  Facial tone is symmetric.    No abnormal lesions on exposed skin.   Strength: Side Biceps Triceps Deltoid Interossei Grip Wrist Ext. Wrist Flex.  R '5 5 5 5 5 5 5  '$ L '4 4 5 4 5 5 5   '$ Side Iliopsoas Quads Hamstring PF DF EHL  R '5 5 5 5 5 5  '$ L '5 5 5 5 5 5   '$ She has pain with strength testing in both the left arm and left leg.   Reflexes are 1+ and symmetric at the biceps, triceps, brachioradialis, patella.  Hoffman's is positive on right, negative on left.   Clonus is not present.   Bilateral upper and lower extremity sensation is intact to light touch. Left arm is hypersensitive to touch.   Gait is very unsteady.   Medical Decision Making  Imaging: Cervical MRI dated 01/03/23:  FINDINGS: Despite efforts by the technologist and patient, motion artifact is present on today's exam and could not be eliminated. This reduces exam sensitivity and specificity.   Alignment: 1.5 mm degenerative retrolisthesis at C3-4.   Vertebrae: Disc desiccation and loss of disc height at all cervical vertebral levels, with multilevel degenerative endplate findings.   Cord: Equivocal accentuated T2 signal in the cord at the C4-5 level probably attributable to central narrowing of the thecal sac. Reduced accuracy of assessment of the cord due to motion artifact.   Posterior Fossa, vertebral arteries, paraspinal tissues: Central pontine accentuated T2 signal compatible with chronic ischemic microvascular white matter disease.   Disc levels:   C2-3: No impingement. Disc bulge and uncinate spurring. Left facet arthropathy.   C3-4: Prominent right and moderate left foraminal stenosis and moderate central narrowing of the thecal sac due to disc osteophyte complex, uncinate spurring, and facet arthropathy.   C4-5: Prominent central narrowing of the thecal sac and prominent bilateral foraminal stenosis due to disc osteophyte complex, facet arthropathy, and uncinate spurring.   C5-6: Moderate to prominent left and moderate right foraminal stenosis and mild central narrowing of the thecal sac due to central disc protrusion, facet arthropathy, and uncinate spurring.   C6-7: Moderate central narrowing of the thecal sac and mild right foraminal stenosis due to disc bulge and uncinate spurring.   C7-T1: Moderate left and mild right foraminal stenosis due to disc bulge, right uncinate spurring, and mild facet arthropathy.   T1-2: Moderate bilateral foraminal  stenosis due to facet arthropathy and uncinate spurring. This level is only included on the parasagittal images.   IMPRESSION: 1. Cervical spondylosis and degenerative disc disease, causing prominent impingement at C3-4 and C4-5; moderate to prominent impingement at C5-6; moderate impingement at C6-7, C7-T1, and T1-2. 2. Equivocal accentuated T2 signal in the cord at the C4-5 level probably reflecting cord edema or myelomalacia attributable to central narrowing of the thecal sac. Reduced accuracy of cord  assessment due to motion artifact. 3. Chronic ischemic microvascular white matter disease involving the pons.     Electronically Signed   By: Van Clines M.D.   On: 01/04/2023 10:50     I have personally reviewed the images and agree with the above interpretation.  Above imaging reviewed with Dr. Izora Ribas during her visit.   Assessment and Plan: Lisa Hawkins is a pleasant 72 y.o. female has 6 month history constant neck pain with radiation to her left arm into her hand (entire hand). No right arm pain. She weakness in left arm and issues with dexterity. She notes balance issues with frequent falling.   She has diffuse cervical spondylosis and DDD with multilevel spinal stenosis that is worst at C4-C5 where she has apparent myelomalacia.   She has weakness in left arm, positive hoffmans, and unsteady gait.    She also notes chronic constant LBP with constant left leg pain (entire leg) to her foot. No right leg pain. She has weakness in left leg along with numbness and tingling.   No recent lumbar imaging.   Treatment options discussed with patient and following plan made:   - Follow up with Dr. Izora Ribas in 2 weeks to discuss surgery options, likely fusion at C4-C5.  - She will need to quit smoking before and after surgery.  - She will need to abstain from using cocaine prior to surgery.  - May need further workup of lumbar issues, but cervical spine will need to be  addressed first.   I spent a total of 40 minutes in face-to-face and non-face-to-face activities related to this patient's care today including review of outside records, review of imaging, review of symptoms, physical exam, discussion of differential diagnosis, discussion of treatment options, and documentation.   Thank you for involving me in the care of this patient.   Geronimo Boot PA-C Dept. of Neurosurgery

## 2023-02-08 ENCOUNTER — Ambulatory Visit (INDEPENDENT_AMBULATORY_CARE_PROVIDER_SITE_OTHER): Payer: Medicare HMO | Admitting: Orthopedic Surgery

## 2023-02-08 ENCOUNTER — Encounter: Payer: Self-pay | Admitting: Orthopedic Surgery

## 2023-02-08 VITALS — BP 104/60 | Ht 63.0 in | Wt 213.0 lb

## 2023-02-08 DIAGNOSIS — G959 Disease of spinal cord, unspecified: Secondary | ICD-10-CM | POA: Diagnosis not present

## 2023-02-08 DIAGNOSIS — M4802 Spinal stenosis, cervical region: Secondary | ICD-10-CM

## 2023-02-08 DIAGNOSIS — M5412 Radiculopathy, cervical region: Secondary | ICD-10-CM

## 2023-02-08 NOTE — Patient Instructions (Signed)
It was so nice to see you today. Thank you so much for coming in.    You have spinal stenosis in your neck (pressure on your spinal cord). I think this is what is causing the pain in your neck and left arm, weakness in left arm, and balance issues.   Dr. Izora Ribas wants to see you in 2 weeks to discuss surgery for your neck.   You will need to try to quit smoking before and after surgery. You cannot use any illegal drugs for at least a week prior to surgery.   Please do not hesitate to call if you have any questions or concerns. You can also message me in [place].   Geronimo Boot PA-C [phone]

## 2023-02-18 NOTE — H&P (View-Only) (Signed)
Referring Physician:  Geronimo Boot, PA-C [address] [place],  [place] [address]  Primary Physician:  [facility] [place]  History of Present Illness: 02/22/2023 [name] is here today with a chief complaint of neck pain as well as shooting pain down her left arm.  She is weakness in her left arm.  She has numbness in her hands.  She has difficulty with walking.  She uses a cane occasionally as well as a walker occasionally because of difficulty with walking.   Bowel/Bladder Dysfunction: none  Conservative measures:  Physical therapy: has not participated in  Multimodal medical therapy including regular antiinflammatories:  gabapentin, celebrex, ibuprofen  Injections: has not received any epidural steroid injections for her neck  Past Surgery:  Back surgery in 1993  Progress Note from Geronimo Boot, [place] on 02/08/23:  History of Present Illness: 02/08/2023 [name] has a history of HTN, hep C, renal disease, CVA, history of crack/cocaine use, and HIV.    Hospitalized April 2023 for suicidal ideation and intentional overdose.    She has 6 month history constant neck pain with radiation to her left arm into her hand (entire hand). No right arm pain. She weakness in left arm and issues with dexterity. She notes balance issues with frequent falling. She has numbness and tingling in left arm as well. Pain is worse with cold weather.    She also has chronic constant LBP with constant left leg pain (entire leg) to her foot. No right leg pain. She has weakness in left leg along with numbness and tingling.    She smokes 1 pack per day (does not smoke when she uses nicotine patches). She continues to use cocaine a few times a month. Last used on Friday 02/04/23.    She is compliant with her HIV medications.    She has some urinary urgency and some recent diarrhea. No incontinence noted.     [name] has symptoms of cervical  myelopathy.   The symptoms are causing a significant impact on the patient's life.      [name] has clear symptoms of cervical myelopathy.  The symptoms are causing a significant impact on the patient's life.   I have utilized the care everywhere function in epic to review the outside records available from external health systems.  Review of Systems:  A 10 point review of systems is negative, except for the pertinent positives and negatives detailed in the HPI.  Past Medical History: Past Medical History:  Diagnosis Date   Anxiety    Arthritis    Bronchitis    Chronic bilateral low back pain with bilateral sciatica 123456   Complication of anesthesia    vomitting   Dysrhythmia    hx of flutter, no longer present   Elevated lipids    GERD (gastroesophageal reflux disease)    does not take meds but has gerd pretty bad   Hepatitis    Hep C - treated with Harvoni   HIV (human immunodeficiency virus infection) ([place])    HOH (hard of hearing)    Hypertension    Myocardial infarction ([place]) 1993   Neuropathy    Seizures ([place])    not considered seizures but does "black out" and unsure of what happened. happens when she overheats. last episode 4 days ago.   Stroke [facility])    no defecits    Past Surgical History: Past Surgical History:  Procedure Laterality Date  ABDOMINAL HYSTERECTOMY  1970   BACK SURGERY     no metal   CATARACT EXTRACTION W/PHACO Right 06/21/2017   Procedure: CATARACT EXTRACTION PHACO AND INTRAOCULAR LENS PLACEMENT (IOC);  Surgeon: Birder Robson, MD;  Location: [facility] ORS;  Service: Ophthalmology;  Laterality: Right;  [place] 00:30.9 AP% 12.7 CDE 3.94 Fluid Pack Lot # [id number]   CATARACT EXTRACTION W/PHACO Left 07/12/2017   Procedure: CATARACT EXTRACTION PHACO AND INTRAOCULAR LENS PLACEMENT (IOC);  Surgeon: Birder Robson, MD;  Location: [facility] ORS;  Service: Ophthalmology;  Laterality: Left;  [place]  00:42 AP% 15.2 CDE 6.38 Fluid pack lot #  [id number]   COLONOSCOPY WITH PROPOFOL N/A 12/09/2020   Procedure: COLONOSCOPY WITH PROPOFOL;  Surgeon: Jonathon Bellows, MD;  Location: [facility] ENDOSCOPY;  Service: Gastroenterology;  Laterality: N/A;   COLONOSCOPY WITH PROPOFOL N/A 09/06/2022   Procedure: COLONOSCOPY WITH PROPOFOL;  Surgeon: Jonathon Bellows, MD;  Location: [facility] ENDOSCOPY;  Service: Gastroenterology;  Laterality: N/A;   DIAGNOSTIC LAPAROSCOPY     FRACTURE SURGERY Left    wrist   HERNIA REPAIR  [id number]   umbilical   JOINT REPLACEMENT Bilateral    KNEE CLOSED REDUCTION Left 12/30/2016   Procedure: CLOSED MANIPULATION KNEE;  Surgeon: Hessie Knows, MD;  Location: [facility] ORS;  Service: Orthopedics;  Laterality: Left;   LAPAROTOMY     TONSILLECTOMY     TOTAL KNEE ARTHROPLASTY Right 07/15/2016   Procedure: TOTAL KNEE ARTHROPLASTY;  Surgeon: Hessie Knows, MD;  Location: [facility] ORS;  Service: Orthopedics;  Laterality: Right;   TOTAL KNEE ARTHROPLASTY Left 10/26/2016   Procedure: TOTAL KNEE ARTHROPLASTY;  Surgeon: Hessie Knows, MD;  Location: [facility] ORS;  Service: Orthopedics;  Laterality: Left;    Allergies: Allergies as of 02/22/2023 - Review Complete 02/22/2023  Allergen Reaction Noted   Penicillins Swelling and Rash 06/15/2016    Medications: Current Meds  Medication Sig   abacavir-dolutegravir-lamiVUDine (TRIUMEQ) 600-50-300 MG tablet Take 1 tablet by mouth daily.   amitriptyline (ELAVIL) 100 MG tablet Take 1 tablet (100 mg total) by mouth at bedtime.   amLODipine (NORVASC) 10 MG tablet Take 1 tablet (10 mg total) by mouth daily.   Aspirin 81 MG CAPS Take 1 capsule by mouth daily.   atorvastatin (LIPITOR) 40 MG tablet Take 40 mg by mouth at bedtime.   celecoxib (CELEBREX) 100 MG capsule Take 1 capsule (100 mg total) by mouth 2 (two) times daily.   FLUoxetine (PROZAC) 20 MG capsule Take 1 capsule (20 mg total) by mouth daily.   gabapentin (NEURONTIN) 300 MG capsule Take 300 mg by mouth 3 (three) times daily.   hydrochlorothiazide (HYDRODIURIL)  25 MG tablet Take 1 tablet (25 mg total) by mouth daily.   metFORMIN (GLUCOPHAGE-XR) 500 MG 24 hr tablet Take 500 mg by mouth daily with breakfast.   nicotine (NICODERM CQ) 21 mg/24hr patch Place 1 patch (21 mg total) onto the skin daily.   pantoprazole (PROTONIX) 20 MG tablet Take 3 tablets (60 mg total) by mouth daily.   polyethylene glycol-electrolytes (NULYTELY) 420 g solution At 5pm evening before colonoscopy-Fill Nulytely container to the fill line with clear liquid.  Mix well.  Drink 8 oz every 20 mins until half contents have been completed. Continue clear liquid diet.  5 hours prior to colonoscopy resume drinking the remaining Nulytely prep drinking 8 oz every 20 mins until entire contents have been completed. So not eat or drink anything 2 hours prior to colonsocopy.   potassium chloride SA (KLOR-CON M) 20 MEQ tablet Take 1 tablet (20  mEq total) by mouth daily.   risperiDONE (RISPERDAL) 0.5 MG tablet Take 1 tablet (0.5 mg total) by mouth 2 (two) times daily at 8 am and 4 pm.   traZODone (DESYREL) 100 MG tablet Take 1 tablet (100 mg total) by mouth at bedtime as needed for sleep.    Social History: Social History   Tobacco Use   Smoking status: Former    Packs/day: 1.00    Years: 45.00    Additional pack years: 0.00    Total pack years: 45.00    Types: Cigarettes    Quit date: 02/09/2023    Years since quitting: 0.0   Smokeless tobacco: Never   Tobacco comments:    Per patient she quit after her appt on 02/08/23, she is still using a nicotine patch.   Vaping Use   Vaping Use: Never used  Substance Use Topics   Alcohol use: No   Drug use: No    Family Medical History: Family History  Problem Relation Age of Onset   Hypertension Father     Physical Examination: Vitals:   02/22/23 1300  BP: (!) 146/80    General: Patient is well developed, well nourished, calm, collected, and in no apparent distress. Attention to examination is appropriate.  Neck:   Supple.  Full  range of motion.  Respiratory: Patient is breathing without any difficulty.   NEUROLOGICAL:     Awake, alert, oriented to person, place, and time.  Speech is clear and fluent.   Cranial Nerves: Pupils equal round and reactive to light.  Facial tone is symmetric.  Facial sensation is symmetric. Shoulder shrug is symmetric. Tongue protrusion is midline.  There is no pronator drift.  ROM of spine: full.    Strength: Side Biceps Triceps Deltoid Interossei Grip Wrist Ext. Wrist Flex.  R 5 5 5 5 5 5 5   L 4 5 4 4  4+ 5 5   Side Iliopsoas Quads Hamstring PF DF EHL  R 5 5 5 5 5 5   L 5 5 5 5 5 5    Reflexes are 2+ and symmetric at the biceps, triceps, brachioradialis, patella and achilles.   Hoffman's is present.   Bilateral upper and lower extremity sensation is intact to light touch.    No evidence of dysmetria noted.  Gait is wide-based.     Medical Decision Making  Imaging: MRI C spine 01/04/2023 IMPRESSION: 1. Cervical spondylosis and degenerative disc disease, causing prominent impingement at C3-4 and C4-5; moderate to prominent impingement at C5-6; moderate impingement at C6-7, C7-T1, and T1-2. 2. Equivocal accentuated T2 signal in the cord at the C4-5 level probably reflecting cord edema or myelomalacia attributable to central narrowing of the thecal sac. Reduced accuracy of cord assessment due to motion artifact. 3. Chronic ischemic microvascular white matter disease involving the pons.     Electronically Signed   By: Van Clines M.D.   On: 01/04/2023 10:50    I have personally reviewed the images and agree with the above interpretation.  Assessment and Plan: Ms. Eagles is a pleasant 72 y.o. female with cervical myelopathy due to cervical stenosis.  She has myelomalacia at C4-5.  Her worst stenosis is at C3-4, C4-5, and C5-6.  The levels below that are not as bad.  Due to her myelopathy, I recommended surgical intervention.  There is no role for conservative  management in this condition.  I have recommended C3-6 anterior cervical discectomy and fusion.  This will address the worst part of  her central and foraminal stenosis.  I discussed the planned procedure at length with the patient, including the risks, benefits, alternatives, and indications. The risks discussed include but are not limited to bleeding, infection, need for reoperation, spinal fluid leak, stroke, vision loss, anesthetic complication, coma, paralysis, and even death. We also discussed the possibility of post-operative dysphagia, vocal cord paralysis, and the risk of adjacent segment disease in the future. I also described in detail that improvement was not guaranteed.  The patient expressed understanding of these risks, and asked that we proceed with surgery. I described the surgery in layman's terms, and gave ample opportunity for questions, which were answered to the best of my ability.     Thank you for involving me in the care of this patient.      Raiya Stainback K. Izora Ribas MD, [facility] Neurosurgery

## 2023-02-18 NOTE — Progress Notes (Unsigned)
Referring Physician:  Geronimo Boot, PA-C [address] [place],  [place] [address]  Primary Physician:  [facility] [place]  History of Present Illness: 02/22/2023 [name] is here today with a chief complaint of neck pain as well as shooting pain down her left arm.  She is weakness in her left arm.  She has numbness in her hands.  She has difficulty with walking.  She uses a cane occasionally as well as a walker occasionally because of difficulty with walking.   Bowel/Bladder Dysfunction: none  Conservative measures:  Physical therapy: has not participated in  Multimodal medical therapy including regular antiinflammatories:  gabapentin, celebrex, ibuprofen  Injections: has not received any epidural steroid injections for her neck  Past Surgery:  Back surgery in 1993  Progress Note from Lisa Hawkins, [place] on 02/08/23:  History of Present Illness: 02/08/2023 [name] has a history of HTN, hep C, renal disease, CVA, history of crack/cocaine use, and HIV.    Hospitalized April 2023 for suicidal ideation and intentional overdose.    She has 6 month history constant neck pain with radiation to her left arm into her hand (entire hand). No right arm pain. She weakness in left arm and issues with dexterity. She notes balance issues with frequent falling. She has numbness and tingling in left arm as well. Pain is worse with cold weather.    She also has chronic constant LBP with constant left leg pain (entire leg) to her foot. No right leg pain. She has weakness in left leg along with numbness and tingling.    She smokes 1 pack per day (does not smoke when she uses nicotine patches). She continues to use cocaine a few times a month. Last used on Friday 02/04/23.    She is compliant with her HIV medications.    She has some urinary urgency and some recent diarrhea. No incontinence noted.     Lisa Hawkins has symptoms of cervical  myelopathy.   The symptoms are causing a significant impact on the patient's life.      Lisa Hawkins has clear symptoms of cervical myelopathy.  The symptoms are causing a significant impact on the patient's life.   I have utilized the care everywhere function in epic to review the outside records available from external health systems.  Review of Systems:  A 10 point review of systems is negative, except for the pertinent positives and negatives detailed in the HPI.  Past Medical History: Past Medical History:  Diagnosis Date   Anxiety    Arthritis    Bronchitis    Chronic bilateral low back pain with bilateral sciatica [id number]   Complication of anesthesia    vomitting   Dysrhythmia    hx of flutter, no longer present   Elevated lipids    GERD (gastroesophageal reflux disease)    does not take meds but has gerd pretty bad   Hepatitis    Hep C - treated with Harvoni   HIV (human immunodeficiency virus infection) ([place])    HOH (hard of hearing)    Hypertension    Myocardial infarction ([place]) 1993   Neuropathy    Seizures ([place])    not considered seizures but does "black out" and unsure of what happened. happens when she overheats. last episode 4 days ago.   Stroke [facility])    no defecits    Past Surgical History: Past Surgical History:  Procedure Laterality Date  ABDOMINAL HYSTERECTOMY  1970   BACK SURGERY     no metal   CATARACT EXTRACTION W/PHACO Right 06/21/2017   Procedure: CATARACT EXTRACTION PHACO AND INTRAOCULAR LENS PLACEMENT (IOC);  Surgeon: Birder Robson, MD;  Location: [facility] ORS;  Service: Ophthalmology;  Laterality: Right;  [place] 00:30.9 AP% 12.7 CDE 3.94 Fluid Pack Lot # [id number]   CATARACT EXTRACTION W/PHACO Left 07/12/2017   Procedure: CATARACT EXTRACTION PHACO AND INTRAOCULAR LENS PLACEMENT (IOC);  Surgeon: Birder Robson, MD;  Location: [facility] ORS;  Service: Ophthalmology;  Laterality: Left;  [place]  00:42 AP% 15.2 CDE 6.38 Fluid pack lot #  [id number]   COLONOSCOPY WITH PROPOFOL N/A 12/09/2020   Procedure: COLONOSCOPY WITH PROPOFOL;  Surgeon: Jonathon Bellows, MD;  Location: [facility] ENDOSCOPY;  Service: Gastroenterology;  Laterality: N/A;   COLONOSCOPY WITH PROPOFOL N/A 09/06/2022   Procedure: COLONOSCOPY WITH PROPOFOL;  Surgeon: Jonathon Bellows, MD;  [facility] END[facility]COPY;  Service: Gastroenterology;  Laterality: N/A;   DIAGNOSTIC LAPAROSCOPY     FRACTURE SURGERY Left    wrist   HERNIA REPAIR  [id number]   umbilical   JOINT REPLACEMENT Bilateral    KNEE CLOSED REDUCTION Left 12/30/2016   Procedure: CLOSED MANIPULATION KNEE;  Surgeon: Hessie Knows, MD;  Location: [facility] ORS;  Service: Orthopedics;  Laterality: Left;   LAPAROTOMY     TONSILLECTOMY     TOTAL KNEE ARTHROPLASTY Right 07/15/2016   Procedure: TOTAL KNEE ARTHROPLASTY;  Surgeon: Hessie Knows, MD;  Location: [facility] ORS;  Service: Orthopedics;  Laterality: Right;   TOTAL KNEE ARTHROPLASTY Left 10/26/2016   Procedure: TOTAL KNEE ARTHROPLASTY;  Surgeon: Hessie Knows, MD;  Location: [facility] ORS;  Service: Orthopedics;  Laterality: Left;    Allergies: Allergies as of 02/22/2023 - Review Complete 02/22/2023  Allergen Reaction Noted   Penicillins Swelling and Rash 06/15/2016    Medications: Current Meds  Medication Sig   abacavir-dolutegravir-lamiVUDine (TRIUMEQ) 600-50-300 MG tablet Take 1 tablet by mouth daily.   amitriptyline (ELAVIL) 100 MG tablet Take 1 tablet (100 mg total) by mouth at bedtime.   amLODipine (NORVASC) 10 MG tablet Take 1 tablet (10 mg total) by mouth daily.   Aspirin 81 MG CAPS Take 1 capsule by mouth daily.   atorvastatin (LIPITOR) 40 MG tablet Take 40 mg by mouth at bedtime.   celecoxib (CELEBREX) 100 MG capsule Take 1 capsule (100 mg total) by mouth 2 (two) times daily.   FLUoxetine (PROZAC) 20 MG capsule Take 1 capsule (20 mg total) by mouth daily.   gabapentin (NEURONTIN) 300 MG capsule Take 300 mg by mouth 3 (three) times daily.   hydrochlorothiazide (HYDRODIURIL)  25 MG tablet Take 1 tablet (25 mg total) by mouth daily.   metFORMIN (GLUCOPHAGE-XR) 500 MG 24 hr tablet Take 500 mg by mouth daily with breakfast.   nicotine (NICODERM CQ) 21 mg/24hr patch Place 1 patch (21 mg total) onto the skin daily.   pantoprazole (PROTONIX) 20 MG tablet Take 3 tablets (60 mg total) by mouth daily.   polyethylene glycol-electrolytes (NULYTELY) 420 g solution At 5pm evening before colonoscopy-Fill Nulytely container to the fill line with clear liquid.  Mix well.  Drink 8 oz every 20 mins until half contents have been completed. Continue clear liquid diet.  5 hours prior to colonoscopy resume drinking the remaining Nulytely prep drinking 8 oz every 20 mins until entire contents have been completed. So not eat or drink anything 2 hours prior to colonsocopy.   potassium chloride SA (KLOR-CON M) 20 MEQ tablet Take 1 tablet (20  mEq total) by mouth daily.   risperiDONE (RISPERDAL) 0.5 MG tablet Take 1 tablet (0.5 mg total) by mouth 2 (two) times daily at 8 am and 4 pm.   traZODone (DESYREL) 100 MG tablet Take 1 tablet (100 mg total) by mouth at bedtime as needed for sleep.    Social History: Social History   Tobacco Use   Smoking status: Former    Packs/day: 1.00    Years: 45.00    Additional pack years: 0.00    Total pack years: 45.00    Types: Cigarettes    Quit date: 02/09/2023    Years since quitting: 0.0   Smokeless tobacco: Never   Tobacco comments:    Per patient she quit after her appt on 02/08/23, she is still using a nicotine patch.   Vaping Use   Vaping Use: Never used  Substance Use Topics   Alcohol use: No   Drug use: No    Family Medical History: Family History  Problem Relation Age of Onset   Hypertension Father     Physical Examination: Vitals:   02/22/23 1300  BP: (!) 146/80    General: Patient is well developed, well nourished, calm, collected, and in no apparent distress. Attention to examination is appropriate.  Neck:   Supple.  Full  range of motion.  Respiratory: Patient is breathing without any difficulty.   NEUROLOGICAL:     Awake, alert, oriented to person, place, and time.  Speech is clear and fluent.   Cranial Nerves: Pupils equal round and reactive to light.  Facial tone is symmetric.  Facial sensation is symmetric. Shoulder shrug is symmetric. Tongue protrusion is midline.  There is no pronator drift.  ROM of spine: full.    Strength: Side Biceps Triceps Deltoid Interossei Grip Wrist Ext. Wrist Flex.  R 5 5 5 5 5 5 5   L 4 5 4 4  4+ 5 5   Side Iliopsoas Quads Hamstring PF DF EHL  R 5 5 5 5 5 5   L 5 5 5 5 5 5    Reflexes are 2+ and symmetric at the biceps, triceps, brachioradialis, patella and achilles.   Hoffman's is present.   Bilateral upper and lower extremity sensation is intact to light touch.    No evidence of dysmetria noted.  Gait is wide-based.     Medical Decision Making  Imaging: MRI C spine 01/04/2023 IMPRESSION: 1. Cervical spondylosis and degenerative disc disease, causing prominent impingement at C3-4 and C4-5; moderate to prominent impingement at C5-6; moderate impingement at C6-7, C7-T1, and T1-2. 2. Equivocal accentuated T2 signal in the cord at the C4-5 level probably reflecting cord edema or myelomalacia attributable to central narrowing of the thecal sac. Reduced accuracy of cord assessment due to motion artifact. 3. Chronic ischemic microvascular white matter disease involving the pons.     Electronically Signed   By: Van Clines M.D.   On: 01/04/2023 10:50    I have personally reviewed the images and agree with the above interpretation.  Assessment and Plan: Ms. Eagles is a pleasant 72 y.o. female with cervical myelopathy due to cervical stenosis.  She has myelomalacia at C4-5.  Her worst stenosis is at C3-4, C4-5, and C5-6.  The levels below that are not as bad.  Due to her myelopathy, I recommended surgical intervention.  There is no role for conservative  management in this condition.  I have recommended C3-6 anterior cervical discectomy and fusion.  This will address the worst part of  her central and foraminal stenosis.  I discussed the planned procedure at length with the patient, including the risks, benefits, alternatives, and indications. The risks discussed include but are not limited to bleeding, infection, need for reoperation, spinal fluid leak, stroke, vision loss, anesthetic complication, coma, paralysis, and even death. We also discussed the possibility of post-operative dysphagia, vocal cord paralysis, and the risk of adjacent segment disease in the future. I also described in detail that improvement was not guaranteed.  The patient expressed understanding of these risks, and asked that we proceed with surgery. I described the surgery in layman's terms, and gave ample opportunity for questions, which were answered to the best of my ability.     Thank you for involving me in the care of this patient.      Nashley Cordoba K. Izora Ribas MD, [facility] Neurosurgery

## 2023-02-22 ENCOUNTER — Encounter: Payer: Self-pay | Admitting: Neurosurgery

## 2023-02-22 ENCOUNTER — Ambulatory Visit (INDEPENDENT_AMBULATORY_CARE_PROVIDER_SITE_OTHER): Payer: Medicare HMO | Admitting: Neurosurgery

## 2023-02-22 VITALS — BP 146/80 | Ht 63.0 in | Wt 213.0 lb

## 2023-02-22 DIAGNOSIS — M4802 Spinal stenosis, cervical region: Secondary | ICD-10-CM

## 2023-02-22 DIAGNOSIS — G959 Disease of spinal cord, unspecified: Secondary | ICD-10-CM | POA: Diagnosis not present

## 2023-02-22 NOTE — Patient Instructions (Signed)
Please see below for information in regards to your upcoming surgery:   Planned surgery: C3-6 anterior cervical discectomy and fusion   Surgery date: 03/23/23 - you will find out your arrival time the business day before your surgery.   Pre-op appointment at [facility]: we will call you with a date/time for this. Pre-admit testing is located on the first floor of the [facility], [place], [address]. Please bring all prescriptions in the original prescription bottles to your appointment, even if you have reviewed medications by phone with a pharmacy representative. Pre-op labs may be done at your pre-op appointment. You are not required to fast for these labs. Should you need to change your pre-op appointment, please call Pre-admit testing at [phone].    Surgical clearance: we will send a clearance request to [place]: [facility] will contact you regarding an appointment for the brace you will use after surgery. Their number is [phone] should you miss their call or have an issue with your brace after surgery. You will need to bring the brace to the hospital on the day of surgery.    Aspirin: ok to stay on aspirin 81mg  due to history of stroke  Metformin: hold for 2 days prior to surgery    NSAIDS (Non-steroidal anti-inflammatory drugs): because you are having a fusion, no NSAIDS (such as ibuprofen, aleve, naproxen, meloxicam, diclofenac) for 3 months after surgery. Celebrex is an exception. Tylenol is ok because it is not an NSAID.    Home health physical therapy: [facility] [place] will contact you regarding home health physical therapy for after surgery.Their number is [phone].   Because you are having a fusion: for appointments after your 2 week follow-up: please arrive at the [facility] outpatient imaging center ([place], [place]) or  [facility] one hour prior to your appointment for x-rays. This applies to every appointment after your 2 week follow-up. Failure to do so may result in your appointment being rescheduled.   If you have FMLA/disability paperwork, please drop it off or fax it to [phone], attention [place].   We can be reached by phone or mychart 8am-4pm, Monday-Friday. If you have any questions/concerns before or after surgery, you can reach [place] at [phone], or you can send a mychart message. If you have a concern after hours that cannot wait until normal business hours, you can call [phone] and ask to page the neurosurgeon on call for [place].    Appointments/FMLA & disability paperwork: [place]  Nurse: Ophelia Shoulder  Medical assistants: Lum Keas Physician Assistant's: [place] Surgeon: Meade Maw, MD

## 2023-02-23 ENCOUNTER — Other Ambulatory Visit: Payer: Self-pay

## 2023-02-23 DIAGNOSIS — Z01818 Encounter for other preprocedural examination: Secondary | ICD-10-CM

## 2023-03-10 ENCOUNTER — Inpatient Hospital Stay: Admission: RE | Admit: 2023-03-10 | Payer: Medicare HMO | Source: Ambulatory Visit

## 2023-03-17 ENCOUNTER — Telehealth: Payer: Self-pay | Admitting: Neurosurgery

## 2023-03-17 ENCOUNTER — Encounter
Admission: RE | Admit: 2023-03-17 | Discharge: 2023-03-17 | Disposition: A | Discharge status: Home / Self Care | Payer: Medicare HMO | Source: Ambulatory Visit | Attending: Neurosurgery | Admitting: Neurosurgery

## 2023-03-17 VITALS — BP 129/76 | HR 103 | Resp 14 | Ht 63.0 in | Wt 212.1 lb

## 2023-03-17 DIAGNOSIS — I10 Essential (primary) hypertension: Secondary | ICD-10-CM | POA: Insufficient documentation

## 2023-03-17 DIAGNOSIS — R825 Elevated urine levels of drugs, medicaments and biological substances: Secondary | ICD-10-CM

## 2023-03-17 DIAGNOSIS — F1410 Cocaine abuse, uncomplicated: Secondary | ICD-10-CM | POA: Diagnosis not present

## 2023-03-17 DIAGNOSIS — Z01812 Encounter for preprocedural laboratory examination: Secondary | ICD-10-CM

## 2023-03-17 DIAGNOSIS — R7309 Other abnormal glucose: Secondary | ICD-10-CM | POA: Insufficient documentation

## 2023-03-17 DIAGNOSIS — R9431 Abnormal electrocardiogram [ECG] [EKG]: Secondary | ICD-10-CM

## 2023-03-17 DIAGNOSIS — R7989 Other specified abnormal findings of blood chemistry: Secondary | ICD-10-CM | POA: Insufficient documentation

## 2023-03-17 DIAGNOSIS — Z01810 Encounter for preprocedural cardiovascular examination: Secondary | ICD-10-CM

## 2023-03-17 DIAGNOSIS — Z01818 Encounter for other preprocedural examination: Secondary | ICD-10-CM | POA: Insufficient documentation

## 2023-03-17 HISTORY — DX: Poisoning by unspecified drugs, medicaments and biological substances, intentional self-harm, initial encounter: T50.902A

## 2023-03-17 HISTORY — DX: Cocaine use, unspecified, uncomplicated: F14.90

## 2023-03-17 HISTORY — DX: Obesity, unspecified: E66.9

## 2023-03-17 HISTORY — DX: Nicotine dependence, unspecified, uncomplicated: F17.200

## 2023-03-17 HISTORY — DX: Hypomagnesemia: E83.42

## 2023-03-17 HISTORY — DX: Disorder of kidney and ureter, unspecified: N28.9

## 2023-03-17 HISTORY — DX: Hypokalemia: E87.6

## 2023-03-17 HISTORY — DX: Chronic pain syndrome: G89.4

## 2023-03-17 HISTORY — DX: Dyspnea, unspecified: R06.00

## 2023-03-17 HISTORY — DX: Atherosclerotic heart disease of native coronary artery without angina pectoris: I25.10

## 2023-03-17 LAB — CBC
HCT: 41.4 % (ref 36.0–46.0)
Hemoglobin: 13.6 g/dL (ref 12.0–15.0)
MCH: 31.1 pg (ref 26.0–34.0)
MCHC: 32.9 g/dL (ref 30.0–36.0)
MCV: 94.7 fL (ref 80.0–100.0)
Platelets: 283 10*3/uL (ref 150–400)
RBC: 4.37 MIL/uL (ref 3.87–5.11)
RDW: 15.3 % (ref 11.5–15.5)
WBC: 6.5 10*3/uL (ref 4.0–10.5)
nRBC: 0 % (ref 0.0–0.2)

## 2023-03-17 LAB — BASIC METABOLIC PANEL
Anion gap: 9 (ref 5–15)
BUN: 10 mg/dL (ref 8–23)
CO2: 25 mmol/L (ref 22–32)
Calcium: 8.9 mg/dL (ref 8.9–10.3)
Chloride: 104 mmol/L (ref 98–111)
Creatinine, Ser: 0.86 mg/dL (ref 0.44–1.00)
GFR, Estimated: >60 mL/min (ref >60)
Glucose, Bld: 99 mg/dL (ref 70–99)
Potassium: 3.8 mmol/L (ref 3.5–5.1)
Sodium: 138 mmol/L (ref 135–145)

## 2023-03-17 LAB — URINE DRUG SCREEN, QUALITATIVE (ARMC ONLY)
Amphetamines, Ur Screen: NOT DETECTED
Barbiturates, Ur Screen: NOT DETECTED
Benzodiazepine, Ur Scrn: NOT DETECTED
Cannabinoid 50 Ng, Ur ~~LOC~~: NOT DETECTED
Cocaine Metabolite,Ur ~~LOC~~: POSITIVE — AB
MDMA (Ecstasy)Ur Screen: NOT DETECTED
Methadone Scn, Ur: NOT DETECTED
Opiate, Ur Screen: NOT DETECTED
Phencyclidine (PCP) Ur S: NOT DETECTED
Tricyclic, Ur Screen: POSITIVE — AB

## 2023-03-17 LAB — TYPE AND SCREEN
ABO/RH(D): B POS
Antibody Screen: NEGATIVE

## 2023-03-17 LAB — SURGICAL PCR SCREEN
MRSA, PCR: NEGATIVE
Staphylococcus aureus: POSITIVE — AB

## 2023-03-17 LAB — HEMOGLOBIN A1C
Hgb A1c MFr Bld: 6 % — ABNORMAL HIGH (ref 4.8–5.6)
Mean Plasma Glucose: 125.5 mg/dL

## 2023-03-17 NOTE — Patient Instructions (Addendum)
Your procedure is scheduled on:03-23-23 Wednesday Report to the Registration Desk on the 1st floor of the [facility].Then proceed to the 2nd floor Surgery Desk To find out your arrival time, please call [phone] between 1PM - 3PM on:03-22-23 Tuesday If your arrival time is 6:00 am, do not arrive before that time as the [facility] entrance doors do not open until 6:00 am.  REMEMBER: Instructions that are not followed completely may result in serious medical risk, up to and including death; or upon the discretion of your surgeon and anesthesiologist your surgery may need to be rescheduled.  Do not eat food after midnight the night before surgery.  No gum chewing or hard candies.  You may however, drink Water up to 2 hours before you are scheduled to arrive for your surgery. Do not drink anything within 2 hours of your scheduled arrival time.  One week prior to surgery: Stop Anti-inflammatories (NSAIDS) such as Advil, Aleve, Ibuprofen, Motrin, Naproxen, Naprosyn and Aspirin based products such as Excedrin, Goody's Powder, BC Powder.You may however, take Tylenol if needed for pain up until the day of surgery. Continue your celecoxib (CELEBREX) up until the day prior to surgery-Do NOT take the morning of surgery  Stop ANY OVER THE COUNTER supplements/vitamins NOW (03-17-23) until after surgery.  Stop your metFORMIN (GLUCOPHAGE-XR) 2 days prior to surgery-Last dose will be on 03-20-23 Sunday  Continue your 81 mg Aspirin up until the day prior to surgery as instructed by Dr Carroll Kinds NOT take the morning of surgery  TAKE ONLY THESE MEDICATIONS THE MORNING OF SURGERY WITH A SIP OF WATER: -abacavir-dolutegravir-lamiVUDine (TRIUMEQ)  -amitriptyline (ELAVIL)  -amLODipine (NORVASC)  -FLUoxetine (PROZAC)  -gabapentin (NEURONTIN)  -potassium chloride SA (KLOR-CON M)  -risperiDONE (RISPERDAL)   No Alcohol for 24 hours before or after surgery.  No Smoking including e-cigarettes for 24 hours  before surgery.  No chewable tobacco products for at least 6 hours before surgery.  No nicotine patches on the day of surgery.  Do not use any "recreational" drugs for at least a week (preferably 2 weeks) before your surgery.  Please be advised that the combination of cocaine and anesthesia may have negative outcomes, up to and including death. If you test positive for cocaine, your surgery will be cancelled.  On the morning of surgery brush your teeth with toothpaste and water, you may rinse your mouth with mouthwash if you wish. Do not swallow any toothpaste or mouthwash.  Use CHG Soap as directed on instruction sheet.  Do not wear jewelry, make-up, hairpins, clips or nail polish.  Do not wear lotions, powders, or perfumes.   Do not shave body hair from the neck down 48 hours before surgery.  Contact lenses, hearing aids and dentures may not be worn into surgery.  Do not bring valuables to the hospital. [facility] is not responsible for any missing/lost belongings or valuables.   Notify your doctor if there is any change in your medical condition (cold, fever, infection).  Wear comfortable clothing (specific to your surgery type) to the hospital.  After surgery, you can help prevent lung complications by doing breathing exercises.  Take deep breaths and cough every 1-2 hours. Your doctor may order a device called an Incentive Spirometer to help you take deep breaths. When coughing or sneezing, hold a pillow firmly against your incision with both hands. This is called "splinting." Doing this helps protect your incision. It also decreases belly discomfort.  If you are being admitted to the  hospital overnight, leave your suitcase in the car. After surgery it may be brought to your room.  In case of increased patient census, it may be necessary for you, the patient, to continue your postoperative care in the Same Day Surgery department.  If you are being discharged the day of  surgery, you will not be allowed to drive home. You will need a responsible individual to drive you home and stay with you for 24 hours after surgery.   If you are taking public transportation, you will need to have a responsible individual with you.  Please call the Pre-admissions Testing Dept. at [phone] if you have any questions about these instructions.  Surgery Visitation Policy:  Patients having surgery or a procedure may have two visitors.  Children under the age of 48 must have an adult with them who is not the patient.  Inpatient Visitation:    Visiting hours are 7 a.m. to 8 p.m. Up to four visitors are allowed at one time in a patient room. The visitors may rotate out with other people during the day.  One visitor age 71 or older may stay with the patient overnight and must be in the room by 8 p.m.

## 2023-03-17 NOTE — Telephone Encounter (Signed)
I tried to communicate with Lisa Hawkins.  She did not answer the phone.

## 2023-03-17 NOTE — Progress Notes (Signed)
Dr Myer Haff attempted to call her a couple of times, but was not able to reach her and her voicemail is full.

## 2023-03-17 NOTE — Progress Notes (Signed)
  [facility] Perioperative Services: Pre-Admission/Anesthesia Testing  Abnormal Lab Notification   Date: 03/17/23  Name: Lisa Hawkins MRN:   [id number]  Re: Abnormal labs noted during PAT appointment   Notified:  Provider Name Provider Role Notification Mode  Venetia Night, MD Neurosurgery Routed and/or faxed via [facility]   Clinical Information and Notes:  ABNORMAL LAB VALUE(S):  Lisa Hawkins is scheduled for a C3-6 ANTERIOR CERVICAL DISCECTOMY AND FUSION on 03/23/2023. Patient with a PMH (+) for cocaine use. Preoperative UDS performed in order to determine what substances patient may have potentially ingested prior to upcoming elective neurosurgical case.   Impression and Plan:  Patient was (+) for cocaine today. The main cocaine metabolite tested for in urine, benzoylecgonine, can be detected for up to +/- 10 days (average 7 days) following cocaine use. Cocaine use, in the setting of general anesthesia administration, places the patient at significantly increased risks of potential perioperative complications. Complications can include, but are not limited to, malignant hypertension, tachyarrhythmias/dysrhythmias, prolonged QTc intervals, myocardial ischemia/vasospasm, convulsions, aortic dissection, CVA, and premature death.   She has been previously advised, both verbally and in writing, that she would need to abstain from the use of illegal substances during the perioperative and immediate postoperative periods. Total cessation all together has been encouraged.   Patient was made aware that if she was cocaine (+) on the day of her procedure, given the elective nature of the planned intervention, her surgery would be postponed pending surgeon discretion and commitment from the patient to refrain from the use of illegal substances as discussed during her appointment with the PAT staff. Patient verbalized understanding of all of the aforementioned when  speaking with the interviewing PAT staff member.   Results from today's testing HAS NOT been discussed with patient, nor are any changes being made to the OR schedule at this point.  Note being sent to primary attending surgeon to make them aware of the results and plans for repeat UDS on the day of surgery; order has been entered. Based on clinical condition of the patient, decision will need to be made as to whether or not this case should be deemed urgent/emergent, or if we are safe to postpone pending negative drug screen.   Quentin Mulling, MSN, APRN, FNP-C, C[facility]  Peri-operative Services Nurse Practitioner Phone: [phone] Fax: [phone] 03/17/23 1:13 PM

## 2023-03-18 NOTE — Telephone Encounter (Signed)
Attempted to contact Ms Vawter to discuss positive urine drug screen and to reiterate the need to refrain from cocaine use prior to surgery.  Left message to return call.

## 2023-03-22 MED ORDER — FAMOTIDINE 20 MG PO TABS: 20.0000 mg | ORAL_TABLET | Freq: Once | ORAL | Status: DC

## 2023-03-22 MED ORDER — CEFAZOLIN IN SODIUM CHLORIDE 2-0.9 GM/100ML-% IV SOLN
2.0000 g | Freq: Once | INTRAVENOUS | Status: DC
  Filled 2023-03-22: qty 100

## 2023-03-22 MED ORDER — SODIUM CHLORIDE 0.9 % IV SOLN: INTRAVENOUS | Status: DC

## 2023-03-22 MED ORDER — ORAL CARE MOUTH RINSE: 15.0000 mL | Freq: Once | OROMUCOSAL | Status: DC

## 2023-03-22 MED ORDER — CHLORHEXIDINE GLUCONATE 0.12 % MT SOLN: 15.0000 mL | Freq: Once | OROMUCOSAL | Status: DC

## 2023-03-22 MED ORDER — CEFAZOLIN SODIUM-DEXTROSE 2-4 GM/100ML-% IV SOLN
2.0000 g | INTRAVENOUS | Status: AC
  Administered 2023-03-23: 2 g via INTRAVENOUS

## 2023-03-23 ENCOUNTER — Other Ambulatory Visit: Payer: Self-pay

## 2023-03-23 ENCOUNTER — Inpatient Hospital Stay: Payer: Medicare HMO

## 2023-03-23 ENCOUNTER — Encounter: Payer: Self-pay | Admitting: Neurosurgery

## 2023-03-23 ENCOUNTER — Inpatient Hospital Stay: Payer: Medicare HMO | Admitting: Urgent Care

## 2023-03-23 ENCOUNTER — Inpatient Hospital Stay: Payer: Medicare HMO | Admitting: Certified Registered Nurse Anesthetist

## 2023-03-23 ENCOUNTER — Inpatient Hospital Stay
Admission: RE | Admit: 2023-03-23 | Discharge: 2023-03-24 | DRG: 472 | Disposition: A | Discharge status: Home / Self Care | Payer: Medicare HMO | Attending: Neurosurgery | Admitting: Neurosurgery

## 2023-03-23 ENCOUNTER — Encounter
Admission: RE | Disposition: A | Discharge status: Home / Self Care | Payer: Self-pay | Source: Home / Self Care | Attending: Neurosurgery

## 2023-03-23 DIAGNOSIS — M2578 Osteophyte, vertebrae: Secondary | ICD-10-CM | POA: Diagnosis present

## 2023-03-23 DIAGNOSIS — Z90710 Acquired absence of both cervix and uterus: Secondary | ICD-10-CM | POA: Diagnosis not present

## 2023-03-23 DIAGNOSIS — G8929 Other chronic pain: Secondary | ICD-10-CM | POA: Diagnosis present

## 2023-03-23 DIAGNOSIS — Z01818 Encounter for other preprocedural examination: Secondary | ICD-10-CM

## 2023-03-23 DIAGNOSIS — Z8249 Family history of ischemic heart disease and other diseases of the circulatory system: Secondary | ICD-10-CM

## 2023-03-23 DIAGNOSIS — G629 Polyneuropathy, unspecified: Secondary | ICD-10-CM | POA: Diagnosis present

## 2023-03-23 DIAGNOSIS — Z21 Asymptomatic human immunodeficiency virus [HIV] infection status: Secondary | ICD-10-CM | POA: Diagnosis present

## 2023-03-23 DIAGNOSIS — Z9151 Personal history of suicidal behavior: Secondary | ICD-10-CM

## 2023-03-23 DIAGNOSIS — F17210 Nicotine dependence, cigarettes, uncomplicated: Secondary | ICD-10-CM | POA: Diagnosis present

## 2023-03-23 DIAGNOSIS — G992 Myelopathy in diseases classified elsewhere: Secondary | ICD-10-CM | POA: Diagnosis present

## 2023-03-23 DIAGNOSIS — I10 Essential (primary) hypertension: Secondary | ICD-10-CM | POA: Diagnosis present

## 2023-03-23 DIAGNOSIS — Z7982 Long term (current) use of aspirin: Secondary | ICD-10-CM | POA: Diagnosis not present

## 2023-03-23 DIAGNOSIS — Z7984 Long term (current) use of oral hypoglycemic drugs: Secondary | ICD-10-CM | POA: Diagnosis not present

## 2023-03-23 DIAGNOSIS — Z79899 Other long term (current) drug therapy: Secondary | ICD-10-CM | POA: Diagnosis not present

## 2023-03-23 DIAGNOSIS — Z8673 Personal history of transient ischemic attack (TIA), and cerebral infarction without residual deficits: Secondary | ICD-10-CM

## 2023-03-23 DIAGNOSIS — Z8619 Personal history of other infectious and parasitic diseases: Secondary | ICD-10-CM

## 2023-03-23 DIAGNOSIS — R296 Repeated falls: Secondary | ICD-10-CM | POA: Diagnosis present

## 2023-03-23 DIAGNOSIS — Z91014 Allergy to mammalian meats: Secondary | ICD-10-CM | POA: Diagnosis not present

## 2023-03-23 DIAGNOSIS — J449 Chronic obstructive pulmonary disease, unspecified: Secondary | ICD-10-CM | POA: Diagnosis present

## 2023-03-23 DIAGNOSIS — Z791 Long term (current) use of non-steroidal anti-inflammatories (NSAID): Secondary | ICD-10-CM

## 2023-03-23 DIAGNOSIS — I252 Old myocardial infarction: Secondary | ICD-10-CM

## 2023-03-23 DIAGNOSIS — K219 Gastro-esophageal reflux disease without esophagitis: Secondary | ICD-10-CM | POA: Diagnosis present

## 2023-03-23 DIAGNOSIS — Z981 Arthrodesis status: Principal | ICD-10-CM

## 2023-03-23 DIAGNOSIS — Z96653 Presence of artificial knee joint, bilateral: Secondary | ICD-10-CM | POA: Diagnosis present

## 2023-03-23 DIAGNOSIS — M4802 Spinal stenosis, cervical region: Secondary | ICD-10-CM | POA: Diagnosis present

## 2023-03-23 DIAGNOSIS — F1410 Cocaine abuse, uncomplicated: Secondary | ICD-10-CM

## 2023-03-23 DIAGNOSIS — G959 Disease of spinal cord, unspecified: Secondary | ICD-10-CM | POA: Diagnosis not present

## 2023-03-23 DIAGNOSIS — R825 Elevated urine levels of drugs, medicaments and biological substances: Secondary | ICD-10-CM

## 2023-03-23 DIAGNOSIS — F419 Anxiety disorder, unspecified: Secondary | ICD-10-CM | POA: Diagnosis present

## 2023-03-23 HISTORY — PX: ANTERIOR CERVICAL DECOMP/DISCECTOMY FUSION: SHX1161

## 2023-03-23 LAB — URINE DRUG SCREEN, QUALITATIVE (ARMC ONLY)
Amphetamines, Ur Screen: NOT DETECTED
Barbiturates, Ur Screen: NOT DETECTED
Benzodiazepine, Ur Scrn: NOT DETECTED
Cannabinoid 50 Ng, Ur ~~LOC~~: NOT DETECTED
Cocaine Metabolite,Ur ~~LOC~~: NOT DETECTED
MDMA (Ecstasy)Ur Screen: NOT DETECTED
Methadone Scn, Ur: NOT DETECTED
Opiate, Ur Screen: NOT DETECTED
Phencyclidine (PCP) Ur S: NOT DETECTED

## 2023-03-23 LAB — GLUCOSE, CAPILLARY: Glucose-Capillary: 110 mg/dL — ABNORMAL HIGH (ref 70–99)

## 2023-03-23 SURGERY — ANTERIOR CERVICAL DECOMPRESSION/DISCECTOMY FUSION 3 LEVELS
Anesthesia: General | Site: Spine Cervical

## 2023-03-23 MED ORDER — FENTANYL CITRATE (PF) 100 MCG/2ML IJ SOLN
25.0000 ug | INTRAMUSCULAR | Status: DC | PRN
  Administered 2023-03-23 (×5): 25 ug via INTRAVENOUS

## 2023-03-23 MED ORDER — PHENYLEPHRINE HCL-NACL 20-0.9 MG/250ML-% IV SOLN
INTRAVENOUS | Status: AC
  Filled 2023-03-23: qty 250

## 2023-03-23 MED ORDER — EPINEPHRINE PF 1 MG/ML IJ SOLN
INTRAMUSCULAR | Status: AC
  Filled 2023-03-23: qty 1

## 2023-03-23 MED ORDER — GABAPENTIN 300 MG PO CAPS
ORAL_CAPSULE | ORAL | Status: AC
  Filled 2023-03-23: qty 1

## 2023-03-23 MED ORDER — PROPOFOL 10 MG/ML IV BOLUS
INTRAVENOUS | Status: DC | PRN
  Administered 2023-03-23: 30 mg via INTRAVENOUS
  Administered 2023-03-23: 50 mg via INTRAVENOUS
  Administered 2023-03-23: 100 mg via INTRAVENOUS
  Administered 2023-03-23: 150 mg via INTRAVENOUS
  Administered 2023-03-23 (×3): 50 mg via INTRAVENOUS

## 2023-03-23 MED ORDER — MENTHOL 3 MG MT LOZG: 1.0000 | LOZENGE | OROMUCOSAL | Status: DC | PRN

## 2023-03-23 MED ORDER — PHENYLEPHRINE HCL (PRESSORS) 10 MG/ML IV SOLN
INTRAVENOUS | Status: DC | PRN
  Administered 2023-03-23: 160 ug via INTRAVENOUS

## 2023-03-23 MED ORDER — LABETALOL HCL 5 MG/ML IV SOLN
INTRAVENOUS | Status: AC
  Filled 2023-03-23: qty 4

## 2023-03-23 MED ORDER — KETAMINE HCL 10 MG/ML IJ SOLN
INTRAMUSCULAR | Status: DC | PRN
  Administered 2023-03-23 (×2): 25 mg via INTRAVENOUS
  Administered 2023-03-23: 15 mg via INTRAVENOUS

## 2023-03-23 MED ORDER — POTASSIUM CHLORIDE CRYS ER 20 MEQ PO TBCR
20.0000 meq | EXTENDED_RELEASE_TABLET | Freq: Every day | ORAL | Status: DC
  Administered 2023-03-23 – 2023-03-24 (×2): 20 meq via ORAL

## 2023-03-23 MED ORDER — MORPHINE SULFATE (PF) 2 MG/ML IV SOLN: 1.0000 mg | INTRAVENOUS | Status: DC | PRN

## 2023-03-23 MED ORDER — METHOCARBAMOL 500 MG PO TABS
ORAL_TABLET | ORAL | Status: AC
  Filled 2023-03-23: qty 1

## 2023-03-23 MED ORDER — PHENYLEPHRINE HCL-NACL 20-0.9 MG/250ML-% IV SOLN
INTRAVENOUS | Status: DC | PRN
  Administered 2023-03-23: 25 ug/min via INTRAVENOUS

## 2023-03-23 MED ORDER — OXYCODONE HCL 5 MG PO TABS
10.0000 mg | ORAL_TABLET | ORAL | Status: DC | PRN
  Administered 2023-03-23 – 2023-03-24 (×5): 10 mg via ORAL

## 2023-03-23 MED ORDER — FENTANYL CITRATE (PF) 100 MCG/2ML IJ SOLN
INTRAMUSCULAR | Status: AC
  Filled 2023-03-23: qty 2

## 2023-03-23 MED ORDER — LABETALOL HCL 5 MG/ML IV SOLN
INTRAVENOUS | Status: DC | PRN
  Administered 2023-03-23: 10 mg via INTRAVENOUS

## 2023-03-23 MED ORDER — BUPIVACAINE HCL (PF) 0.5 % IJ SOLN
INTRAMUSCULAR | Status: AC
  Filled 2023-03-23: qty 30

## 2023-03-23 MED ORDER — PROPOFOL 1000 MG/100ML IV EMUL
INTRAVENOUS | Status: AC
  Filled 2023-03-23: qty 100

## 2023-03-23 MED ORDER — PHENOL 1.4 % MT LIQD: 1.0000 | OROMUCOSAL | Status: DC | PRN

## 2023-03-23 MED ORDER — VASOPRESSIN 20 UNIT/ML IV SOLN
INTRAVENOUS | Status: DC | PRN
  Administered 2023-03-23: 1 [IU] via INTRAVENOUS
  Administered 2023-03-23 (×2): .5 [IU] via INTRAVENOUS

## 2023-03-23 MED ORDER — DROPERIDOL 2.5 MG/ML IJ SOLN: 0.6250 mg | Freq: Once | INTRAMUSCULAR | Status: DC | PRN

## 2023-03-23 MED ORDER — PROPOFOL 500 MG/50ML IV EMUL
INTRAVENOUS | Status: DC | PRN
  Administered 2023-03-23: 150 ug/kg/min via INTRAVENOUS

## 2023-03-23 MED ORDER — SENNOSIDES-DOCUSATE SODIUM 8.6-50 MG PO TABS: 1.0000 | ORAL_TABLET | Freq: Every evening | ORAL | Status: DC | PRN

## 2023-03-23 MED ORDER — REMIFENTANIL HCL 1 MG IV SOLR
INTRAVENOUS | Status: DC | PRN
  Administered 2023-03-23: 100 ug via INTRAVENOUS
  Administered 2023-03-23: .1 ug/kg/min via INTRAVENOUS

## 2023-03-23 MED ORDER — CEFAZOLIN SODIUM-DEXTROSE 2-4 GM/100ML-% IV SOLN
INTRAVENOUS | Status: AC
  Filled 2023-03-23: qty 100

## 2023-03-23 MED ORDER — POTASSIUM CHLORIDE CRYS ER 20 MEQ PO TBCR
EXTENDED_RELEASE_TABLET | ORAL | Status: AC
  Filled 2023-03-23: qty 1

## 2023-03-23 MED ORDER — OXYCODONE HCL 5 MG PO TABS
ORAL_TABLET | ORAL | Status: AC
  Filled 2023-03-23: qty 2

## 2023-03-23 MED ORDER — HYDROCHLOROTHIAZIDE 25 MG PO TABS
ORAL_TABLET | ORAL | Status: AC
  Filled 2023-03-23: qty 1

## 2023-03-23 MED ORDER — FENTANYL CITRATE (PF) 100 MCG/2ML IJ SOLN
INTRAMUSCULAR | Status: DC | PRN
  Administered 2023-03-23: 25 ug via INTRAVENOUS
  Administered 2023-03-23: 50 ug via INTRAVENOUS

## 2023-03-23 MED ORDER — ONDANSETRON HCL 4 MG/2ML IJ SOLN
INTRAMUSCULAR | Status: DC | PRN
  Administered 2023-03-23: 4 mg via INTRAVENOUS

## 2023-03-23 MED ORDER — METHOCARBAMOL 500 MG PO TABS
500.0000 mg | ORAL_TABLET | Freq: Four times a day (QID) | ORAL | Status: DC | PRN
  Administered 2023-03-23: 500 mg via ORAL

## 2023-03-23 MED ORDER — FLUOXETINE HCL 20 MG PO CAPS
20.0000 mg | ORAL_CAPSULE | ORAL | Status: DC
  Administered 2023-03-24: 20 mg via ORAL

## 2023-03-23 MED ORDER — MAGNESIUM CITRATE PO SOLN: 1.0000 | Freq: Once | ORAL | Status: DC | PRN

## 2023-03-23 MED ORDER — ACETAMINOPHEN 500 MG PO TABS
ORAL_TABLET | ORAL | Status: AC
  Filled 2023-03-23: qty 2

## 2023-03-23 MED ORDER — ESMOLOL HCL 100 MG/10ML IV SOLN
INTRAVENOUS | Status: AC
  Filled 2023-03-23: qty 10

## 2023-03-23 MED ORDER — GABAPENTIN 300 MG PO CAPS
300.0000 mg | ORAL_CAPSULE | Freq: Two times a day (BID) | ORAL | Status: DC
  Administered 2023-03-23 – 2023-03-24 (×2): 300 mg via ORAL

## 2023-03-23 MED ORDER — EPHEDRINE 5 MG/ML INJ
INTRAVENOUS | Status: AC
  Filled 2023-03-23: qty 5

## 2023-03-23 MED ORDER — ESMOLOL HCL 100 MG/10ML IV SOLN
INTRAVENOUS | Status: DC | PRN
  Administered 2023-03-23: 20 mg via INTRAVENOUS
  Administered 2023-03-23: 10 mg via INTRAVENOUS

## 2023-03-23 MED ORDER — OXYCODONE HCL 5 MG PO TABS: 5.0000 mg | ORAL_TABLET | ORAL | Status: DC | PRN

## 2023-03-23 MED ORDER — METHOCARBAMOL 1000 MG/10ML IJ SOLN
500.0000 mg | Freq: Four times a day (QID) | INTRAVENOUS | Status: DC | PRN
  Administered 2023-03-23: 500 mg via INTRAVENOUS
  Filled 2023-03-23: qty 5

## 2023-03-23 MED ORDER — ACETAMINOPHEN 10 MG/ML IV SOLN
INTRAVENOUS | Status: AC
  Filled 2023-03-23: qty 100

## 2023-03-23 MED ORDER — KETAMINE HCL 50 MG/5ML IJ SOSY
PREFILLED_SYRINGE | INTRAMUSCULAR | Status: AC
  Filled 2023-03-23: qty 5

## 2023-03-23 MED ORDER — HYDROCHLOROTHIAZIDE 25 MG PO TABS
25.0000 mg | ORAL_TABLET | Freq: Once | ORAL | Status: AC
  Administered 2023-03-23: 25 mg via ORAL

## 2023-03-23 MED ORDER — REMIFENTANIL HCL 1 MG IV SOLR
INTRAVENOUS | Status: AC
  Filled 2023-03-23: qty 1000

## 2023-03-23 MED ORDER — RISPERIDONE 1 MG PO TABS
1.0000 mg | ORAL_TABLET | ORAL | Status: DC
  Administered 2023-03-24: 1 mg via ORAL
  Filled 2023-03-23: qty 1

## 2023-03-23 MED ORDER — LABETALOL HCL 5 MG/ML IV SOLN: 10.0000 mg | INTRAVENOUS | Status: DC | PRN

## 2023-03-23 MED ORDER — HYDROCHLOROTHIAZIDE 25 MG PO TABS
25.0000 mg | ORAL_TABLET | ORAL | Status: DC
  Administered 2023-03-24: 25 mg via ORAL
  Filled 2023-03-23: qty 1

## 2023-03-23 MED ORDER — ACETAMINOPHEN 500 MG PO TABS
1000.0000 mg | ORAL_TABLET | Freq: Four times a day (QID) | ORAL | Status: AC
  Administered 2023-03-23 – 2023-03-24 (×4): 1000 mg via ORAL

## 2023-03-23 MED ORDER — BUPIVACAINE-EPINEPHRINE 0.5% -1:200000 IJ SOLN
INTRAMUSCULAR | Status: DC | PRN
  Administered 2023-03-23: 6 mL

## 2023-03-23 MED ORDER — 0.9 % SODIUM CHLORIDE (POUR BTL) OPTIME
TOPICAL | Status: DC | PRN
  Administered 2023-03-23: 500 mL

## 2023-03-23 MED ORDER — SODIUM CHLORIDE 0.9 % IV SOLN: INTRAVENOUS | Status: DC

## 2023-03-23 MED ORDER — SODIUM CHLORIDE 0.9% FLUSH
3.0000 mL | INTRAVENOUS | Status: DC | PRN
  Administered 2023-03-23: 3 mL via INTRAVENOUS

## 2023-03-23 MED ORDER — LIDOCAINE HCL (PF) 2 % IJ SOLN
INTRAMUSCULAR | Status: AC
  Filled 2023-03-23: qty 5

## 2023-03-23 MED ORDER — ACETAMINOPHEN 10 MG/ML IV SOLN
INTRAVENOUS | Status: DC | PRN
  Administered 2023-03-23: 1000 mg via INTRAVENOUS

## 2023-03-23 MED ORDER — ABACAVIR-DOLUTEGRAVIR-LAMIVUD 600-50-300 MG PO TABS
1.0000 | ORAL_TABLET | ORAL | Status: DC
  Administered 2023-03-24: 1 via ORAL
  Filled 2023-03-23: qty 1

## 2023-03-23 MED ORDER — LIDOCAINE HCL (CARDIAC) PF 100 MG/5ML IV SOSY
PREFILLED_SYRINGE | INTRAVENOUS | Status: DC | PRN
  Administered 2023-03-23: 100 mg via INTRAVENOUS

## 2023-03-23 MED ORDER — METFORMIN HCL ER 500 MG PO TB24: 500.0000 mg | ORAL_TABLET | Freq: Every day | ORAL | Status: DC

## 2023-03-23 MED ORDER — SENNA 8.6 MG PO TABS
1.0000 | ORAL_TABLET | Freq: Two times a day (BID) | ORAL | Status: DC
  Administered 2023-03-23 – 2023-03-24 (×2): 8.6 mg via ORAL

## 2023-03-23 MED ORDER — DOCUSATE SODIUM 100 MG PO CAPS
100.0000 mg | ORAL_CAPSULE | Freq: Two times a day (BID) | ORAL | Status: DC
  Administered 2023-03-23 – 2023-03-24 (×2): 100 mg via ORAL

## 2023-03-23 MED ORDER — ATORVASTATIN CALCIUM 20 MG PO TABS
40.0000 mg | ORAL_TABLET | Freq: Every day | ORAL | Status: DC
  Administered 2023-03-23: 40 mg via ORAL

## 2023-03-23 MED ORDER — METOPROLOL TARTRATE 5 MG/5ML IV SOLN
INTRAVENOUS | Status: DC | PRN
  Administered 2023-03-23: 2 mg via INTRAVENOUS

## 2023-03-23 MED ORDER — EPHEDRINE SULFATE (PRESSORS) 50 MG/ML IJ SOLN
INTRAMUSCULAR | Status: DC | PRN
  Administered 2023-03-23: 10 mg via INTRAVENOUS
  Administered 2023-03-23 (×2): 5 mg via INTRAVENOUS

## 2023-03-23 MED ORDER — CHLORHEXIDINE GLUCONATE 0.12 % MT SOLN
OROMUCOSAL | Status: AC
  Filled 2023-03-23: qty 15

## 2023-03-23 MED ORDER — AMLODIPINE BESYLATE 5 MG PO TABS: 10.0000 mg | ORAL_TABLET | ORAL | Status: DC

## 2023-03-23 MED ORDER — TRAZODONE HCL 100 MG PO TABS
100.0000 mg | ORAL_TABLET | Freq: Every day | ORAL | Status: DC
  Administered 2023-03-23: 100 mg via ORAL
  Filled 2023-03-23 (×2): qty 1

## 2023-03-23 MED ORDER — DOCUSATE SODIUM 100 MG PO CAPS
ORAL_CAPSULE | ORAL | Status: AC
  Filled 2023-03-23: qty 1

## 2023-03-23 MED ORDER — AMLODIPINE BESYLATE 5 MG PO TABS
ORAL_TABLET | ORAL | Status: AC
  Filled 2023-03-23: qty 2

## 2023-03-23 MED ORDER — ONDANSETRON HCL 4 MG/2ML IJ SOLN: 4.0000 mg | Freq: Four times a day (QID) | INTRAMUSCULAR | Status: DC | PRN

## 2023-03-23 MED ORDER — SUCCINYLCHOLINE CHLORIDE 200 MG/10ML IV SOSY
PREFILLED_SYRINGE | INTRAVENOUS | Status: DC | PRN
  Administered 2023-03-23: 100 mg via INTRAVENOUS

## 2023-03-23 MED ORDER — METOPROLOL TARTRATE 5 MG/5ML IV SOLN
INTRAVENOUS | Status: AC
  Filled 2023-03-23: qty 5

## 2023-03-23 MED ORDER — DEXAMETHASONE SODIUM PHOSPHATE 10 MG/ML IJ SOLN
INTRAMUSCULAR | Status: AC
  Filled 2023-03-23: qty 1

## 2023-03-23 MED ORDER — AMITRIPTYLINE HCL 100 MG PO TABS
100.0000 mg | ORAL_TABLET | ORAL | Status: DC
  Administered 2023-03-24: 100 mg via ORAL
  Filled 2023-03-23: qty 1

## 2023-03-23 MED ORDER — ONDANSETRON HCL 4 MG PO TABS: 4.0000 mg | ORAL_TABLET | Freq: Four times a day (QID) | ORAL | Status: DC | PRN

## 2023-03-23 MED ORDER — LABETALOL HCL 5 MG/ML IV SOLN
5.0000 mg | INTRAVENOUS | Status: DC | PRN
  Administered 2023-03-23 (×4): 5 mg via INTRAVENOUS

## 2023-03-23 MED ORDER — ENOXAPARIN SODIUM 40 MG/0.4ML IJ SOSY
40.0000 mg | PREFILLED_SYRINGE | INTRAMUSCULAR | Status: DC
  Administered 2023-03-24: 40 mg via SUBCUTANEOUS

## 2023-03-23 MED ORDER — SODIUM CHLORIDE 0.9% FLUSH
3.0000 mL | Freq: Two times a day (BID) | INTRAVENOUS | Status: DC
  Administered 2023-03-23: 3 mL via INTRAVENOUS

## 2023-03-23 MED ORDER — CELECOXIB 100 MG PO CAPS
100.0000 mg | ORAL_CAPSULE | ORAL | Status: DC
  Administered 2023-03-24: 100 mg via ORAL
  Filled 2023-03-23: qty 1

## 2023-03-23 MED ORDER — SENNA 8.6 MG PO TABS
ORAL_TABLET | ORAL | Status: AC
  Filled 2023-03-23: qty 1

## 2023-03-23 MED ORDER — ONDANSETRON HCL 4 MG/2ML IJ SOLN
INTRAMUSCULAR | Status: AC
  Filled 2023-03-23: qty 2

## 2023-03-23 MED ORDER — ATORVASTATIN CALCIUM 20 MG PO TABS
ORAL_TABLET | ORAL | Status: AC
  Filled 2023-03-23: qty 1

## 2023-03-23 MED ORDER — DEXMEDETOMIDINE HCL IN NACL 80 MCG/20ML IV SOLN
INTRAVENOUS | Status: DC | PRN
  Administered 2023-03-23: 12 ug via INTRAVENOUS

## 2023-03-23 MED ORDER — BISACODYL 10 MG RE SUPP: 10.0000 mg | Freq: Every day | RECTAL | Status: DC | PRN

## 2023-03-23 MED ORDER — DEXAMETHASONE SODIUM PHOSPHATE 10 MG/ML IJ SOLN
INTRAMUSCULAR | Status: DC | PRN
  Administered 2023-03-23: 10 mg via INTRAVENOUS

## 2023-03-23 MED ORDER — SURGIFLO WITH THROMBIN (HEMOSTATIC MATRIX KIT) OPTIME
TOPICAL | Status: DC | PRN
  Administered 2023-03-23 (×2): 1 via TOPICAL

## 2023-03-23 MED ORDER — SODIUM CHLORIDE 0.9 % IV SOLN: 250.0000 mL | INTRAVENOUS | Status: DC

## 2023-03-23 MED ORDER — SUCCINYLCHOLINE CHLORIDE 200 MG/10ML IV SOSY
PREFILLED_SYRINGE | INTRAVENOUS | Status: AC
  Filled 2023-03-23: qty 10

## 2023-03-23 MED ORDER — AMLODIPINE BESYLATE 5 MG PO TABS
10.0000 mg | ORAL_TABLET | Freq: Every day | ORAL | Status: DC
  Administered 2023-03-23 – 2023-03-24 (×2): 10 mg via ORAL

## 2023-03-23 MED ORDER — LOPERAMIDE HCL 2 MG PO CAPS: 2.0000 mg | ORAL_CAPSULE | ORAL | Status: DC | PRN

## 2023-03-23 SURGICAL SUPPLY — 54 items
ADH SKN CLS APL DERMABOND .7 (GAUZE/BANDAGES/DRESSINGS) ×1
AGENT HMST KT MTR STRL THRMB (HEMOSTASIS) ×1
BASIN KIT SINGLE STR (MISCELLANEOUS) ×1 IMPLANT
BULB RESERV EVAC DRAIN JP 100C (MISCELLANEOUS) IMPLANT
BUR NEURO DRILL SOFT 3.0X3.8M (BURR) ×1 IMPLANT
DERMABOND ADVANCED .7 DNX12 (GAUZE/BANDAGES/DRESSINGS) ×1 IMPLANT
DRAIN CHANNEL JP 10F RND 20C F (MISCELLANEOUS) IMPLANT
DRAIN RND TROCAR 1/8FLUTED 10F (DRAIN) IMPLANT
DRAPE C ARM PK CFD 31 SPINE (DRAPES) ×1 IMPLANT
DRAPE LAPAROTOMY 77X122 PED (DRAPES) ×1 IMPLANT
DRAPE MICROSCOPE SPINE 48X150 (DRAPES) ×1 IMPLANT
DRSG OPSITE POSTOP 4X6 (GAUZE/BANDAGES/DRESSINGS) ×2 IMPLANT
DRSG TEGADERM 2-3/8X2-3/4 SM (GAUZE/BANDAGES/DRESSINGS) IMPLANT
FEE INTRAOP CADWELL SUPPLY NCS (MISCELLANEOUS) IMPLANT
FEE INTRAOP MONITOR IMPULS NCS (MISCELLANEOUS) IMPLANT
GAUZE SPONGE 2X2 STRL 8-PLY (GAUZE/BANDAGES/DRESSINGS) IMPLANT
GLOVE BIOGEL PI IND STRL 6.5 (GLOVE) ×1 IMPLANT
GLOVE SURG SYN 6.5 ES PF (GLOVE) ×2 IMPLANT
GLOVE SURG SYN 6.5 PF PI (GLOVE) ×2 IMPLANT
GLOVE SURG SYN 8.5  E (GLOVE) ×3
GLOVE SURG SYN 8.5 E (GLOVE) ×3 IMPLANT
GLOVE SURG SYN 8.5 PF PI (GLOVE) ×3 IMPLANT
GOWN SRG LRG LVL 4 IMPRV REINF (GOWNS) ×2 IMPLANT
GOWN SRG XL LVL 3 NONREINFORCE (GOWNS) ×1 IMPLANT
GOWN STRL NON-REIN TWL XL LVL3 (GOWNS) ×1
GOWN STRL REIN LRG LVL4 (GOWNS) ×2
INTRAOP CADWELL SUPPLY FEE NCS (MISCELLANEOUS) ×1
INTRAOP DISP SUPPLY FEE NCS (MISCELLANEOUS) ×1
INTRAOP MONITOR FEE IMPULS NCS (MISCELLANEOUS) ×1
INTRAOP MONITOR FEE IMPULSE (MISCELLANEOUS) ×1
KIT TURNOVER KIT A (KITS) ×1 IMPLANT
MANIFOLD NEPTUNE II (INSTRUMENTS) ×1 IMPLANT
NS IRRIG 1000ML POUR BTL (IV SOLUTION) ×1 IMPLANT
PACK LAMINECTOMY NEURO (CUSTOM PROCEDURE TRAY) ×1 IMPLANT
PAD ARMBOARD 7.5X6 YLW CONV (MISCELLANEOUS) ×2 IMPLANT
PIN CASPAR 14 (PIN) ×1 IMPLANT
PIN CASPAR 14MM (PIN) ×1
PLATE ACP 1.9X54 3LVL (Plate) IMPLANT
PUTTY DBM PROPEL MEDIUM (Putty) IMPLANT
SCREW ACP VA SD 3.5X15 (Screw) IMPLANT
SCREW ACP VA ST 4.0 X 17 (Screw) IMPLANT
SPACER C HEDRON 12X14 6 7D (Spacer) IMPLANT
SPACER C HEDRON 12X14 7M 7D (Spacer) IMPLANT
SPONGE KITTNER 5P (MISCELLANEOUS) ×2 IMPLANT
STAPLER SKIN PROX 35W (STAPLE) IMPLANT
SURGIFLO W/THROMBIN 8M KIT (HEMOSTASIS) ×1 IMPLANT
SUT ETHILON 3-0 (SUTURE) IMPLANT
SUT V-LOC 90 ABS DVC 3-0 CL (SUTURE) ×1 IMPLANT
SUT VIC AB 3-0 SH 8-18 (SUTURE) ×1 IMPLANT
SUT VICRYL 3-0 CR8 SH (SUTURE) ×1 IMPLANT
SYR 20ML LL LF (SYRINGE) ×1 IMPLANT
TAPE CLOTH 3X10 WHT NS LF (GAUZE/BANDAGES/DRESSINGS) ×3 IMPLANT
TRAP FLUID SMOKE EVACUATOR (MISCELLANEOUS) ×1 IMPLANT
TRAY FOLEY SLVR 16FR LF STAT (SET/KITS/TRAYS/PACK) IMPLANT

## 2023-03-23 NOTE — Plan of Care (Signed)
  Problem: Skin Integrity: Goal: Will show signs of wound healing Outcome: Progressing   

## 2023-03-23 NOTE — Op Note (Signed)
Indications: Lisa Hawkins is a 72 yo femalele who presented with G95.9 cervical myelopathy, M48.02 cervical stenosis .  She had worsening symptoms prompting surgical intervention.  Findings: cervical stenosis  Preoperative Diagnosis: G95.9 cervical myelopathy, M48.02 cervical stenosis  Postoperative Diagnosis: same   EBL: 100 ml IVF: see AR ml Drains: 1 placed Disposition: Extubated and Stable to PACU Complications: none  A foley catheter was placed.   Preoperative Note:   Risks of surgery discussed include: infection, bleeding, stroke, coma, death, paralysis, CSF leak, nerve/spinal cord injury, numbness, tingling, weakness, complex regional pain syndrome, recurrent stenosis and/or disc herniation, vascular injury, development of instability, neck/back pain, need for further surgery, persistent symptoms, development of deformity, and the risks of anesthesia. The patient understood these risks and agreed to proceed.  Operative Note:  Operative Procedure: 1. Anterior Cervical Discectomy and Fusion C4-5 including bilateral foraminotomies and end plate preparation  2. Anterior Cervical Discectomy and Fusion C5-6 including bilateral foraminotomies and end plate preparation  3. Anterior Cervical Discectomy and Fusion C3-4 including bilateral foraminotomies and end plate preparation 4. Anterior Spinal Instrumentation C3 to 6 using Nuvasive ACP 5. Anterior arthrodesis from C3 to C6 with placement of biomechanical devices at C3-4, C4-5, and C5-6 6. Use of the operative microscope 7. Use of intraoperative flouroscopy  PROCEDURE IN DETAIL: After obtaining informed consent, the patient taken to the operating room, placed in supine position, general anesthesia induced.  The patient had a small shoulder roll placed behind their shoulders.  The patient received preop antibiotics and IV Decadron.  The patient had a neck incision outlined, was prepped and draped in usual sterile fashion. The incision  was injected with local anesthetic.   An incision was opened, dissection taken down medial to the carotid artery and jugular vein, lateral to the trachea and esophagus.  The prevertebral fascia identified and a localizing x-ray demonstrated the correct level.  The longus colli were dissected laterally, and self-retaining retractors placed to open the operative field. The microscope was then brought into the field.  With this complete, distractor pins were placed in the vertebral bodies of C3 and C4. The distractor was placed from C3-4, and the annulus at C3-4 was opened using a bovie.  Curettes and pituitary rongeurs used to remove the majority of disk, then the drill was used to remove the posterior osteophyte and begin the foraminotomies. The nerve hook was used to elevate the posterior longitudinal ligament, which was then removed with Kerrison rongeurs. The microblunt nerve hook could be passed out the foramen bilaterally.   Meticulous hemostasis was obtained.    A trial was used to size the disc space. A biomechanical device (Globus Hedron C 6 mm height x 14 mm width x 12 mm depth) was filled with bone-promoting allograft and tapped behind the anterior lip of the vertebral body at C3/4.    The distractor was removed, and the C3 distractor pin removed. Bone wax was used for hemostasis.  A caspar pin was placed at C6, then the distractor placed at C4-6. The annulus C4-5 was opened using a bovie.  Curettes and pituitary rongeurs used to remove the majority of disk, then the drill was used to remove the posterior osteophyte and begin the foraminotomies. The nerve hook was used to elevate the posterior longitudinal ligament, which was then removed with Kerrison rongeurs. The microblunt nerve hook could be passed out the foramen bilaterally.   Meticulous hemostasis was obtained.    A trial was used to size the disc  space. A biomechanical device (Globus Hedron C 7 mm height x 14 mm width x 12 mm depth) was  filled with bone-promoting allograft and tapped behind the anterior lip of the vertebral body at C4/5.    The annulus C5-6 was opened using a bovie.  Curettes and pituitary rongeurs used to remove the majority of disk, then the drill was used to remove the posterior osteophyte and begin the foraminotomies. The nerve hook was used to elevate the posterior longitudinal ligament, which was then removed with Kerrison rongeurs. The microblunt nerve hook could be passed out the foramen bilaterally.   Meticulous hemostasis was obtained.    A trial was used to size the disc space. A biomechanical device (Globus Hedron C 6 mm height x 14 mm width x 12 mm depth) was filled with bone-promoting allograft and tapped behind the anterior lip of the vertebral body at C5/6.   The caspar distractor was removed, and bone wax used for hemostasis at each level. The anterior osteophytes were removed.   A separate, four segment, three level plate (54 mm Nuvasive ACP) was chosen.  Two screws placed in the vertebral bodies of all four segments, respectively making sure the screws were behind the locking mechanism.  Final AP and lateral radiographs were taken.  Please note that the plate is not inclusive to the biomechanical devices.  The anchoring mechanism of the plate is completely separate from the biomechanical devices.   With everything in good position, the wound was irrigated copiously with bacitracin-containing solution and meticulous hemostasis obtained.  A drain was placed. Wound was closed in 2 layers using interrupted inverted 3-0 Vicryl sutures in the platysma and 3-0 monocryl in the dermis.  The wound was dressed with dermabond, the head of bed at 30 degrees, taken to recovery room in stable condition.  No new postop neurological deficits were identified.  All counts were correct at the end of the case.   Monitoring was used throughout without any changes.  Manning Charity PA assisted in the procedure. An  assistant was required for this procedure due to the complexity.  The assistant provided assistance in tissue manipulation and suction, and was required for the successful and safe performance of the procedure. I performed the critical portions of the procedure.   Venetia Night MD

## 2023-03-23 NOTE — Anesthesia Preprocedure Evaluation (Signed)
Anesthesia Evaluation  Patient identified by MRN, date of birth, ID band Patient awake    Reviewed: Allergy & Precautions, NPO status , Patient's Chart, lab work & pertinent test results  History of Anesthesia Complications (+) PONV and history of anesthetic complications  Airway Mallampati: III  TM Distance: >3 FB Neck ROM: Full    Dental  (+) Edentulous Upper, Edentulous Lower, Dental Advidsory Given   Pulmonary neg pulmonary ROS, shortness of breath and with exertion, COPD, neg recent URI, Patient abstained from smoking., former smoker   Pulmonary exam normal  + decreased breath sounds      Cardiovascular Exercise Tolerance: Poor hypertension, Pt. on medications (-) angina + CAD and + Past MI  (-) Cardiac Stents Normal cardiovascular exam+ dysrhythmias (-) Valvular Problems/Murmurs Rhythm:Regular Rate:Normal     Neuro/Psych Seizures -, Well Controlled,  PSYCHIATRIC DISORDERS Anxiety Depression     Neuromuscular disease CVA, No Residual Symptoms    GI/Hepatic negative GI ROS,GERD  Medicated,,(+)     substance abuse  cocaine use, Hepatitis -, C  Endo/Other  negative endocrine ROSneg diabetes    Renal/GU Renal disease  negative genitourinary   Musculoskeletal  (+) Arthritis ,    Abdominal   Peds negative pediatric ROS (+)  Hematology negative hematology ROS (+)   Anesthesia Other Findings Past Medical History: No date: Anxiety No date: Arthritis No date: Bronchitis 05/19/2020: Chronic bilateral low back pain with bilateral sciatica No date: Complication of anesthesia     Comment:  vomitting No date: Dysrhythmia     Comment:  hx of flutter, no longer present No date: Elevated lipids No date: GERD (gastroesophageal reflux disease)     Comment:  does not take meds but has gerd pretty bad No date: Hepatitis     Comment:  Hep C - treated with Harvoni No date: HIV (human immunodeficiency virus infection)  (HCC) No date: HOH (hard of hearing) No date: Hypertension 1993: Myocardial infarction (HCC) No date: Neuropathy No date: Seizures (HCC)     Comment:  not considered seizures but does "black out" and unsure               of what happened. happens when she overheats. last               episode 4 days ago. No date: Stroke [facility])     Comment:  no defecits  Past Surgical History: 1970: ABDOMINAL HYSTERECTOMY No date: BACK SURGERY     Comment:  no metal 06/21/2017: CATARACT EXTRACTION W/PHACO; Right     Comment:  Procedure: CATARACT EXTRACTION PHACO AND INTRAOCULAR               LENS PLACEMENT (IOC);  Surgeon: Galen Manila, MD;                Location: [facility] ORS;  Service: Ophthalmology;  Laterality:              Right;  [place] 00:30.9 AP% 12.7 CDE 3.94 Fluid Pack Lot #               [id number] 07/12/2017: CATARACT EXTRACTION W/PHACO; Left     Comment:  Procedure: CATARACT EXTRACTION PHACO AND INTRAOCULAR               LENS PLACEMENT (IOC);  Surgeon: Galen Manila, MD;                Location: [facility] ORS;  Service: Ophthalmology;  Laterality:  Left;  [place]  00:42 AP% 15.2 CDE 6.38 Fluid pack lot #               [id number] 12/09/2020: COLONOSCOPY WITH PROPOFOL; N/A     Comment:  Procedure: COLONOSCOPY WITH PROPOFOL;  Surgeon: Wyline Mood, MD;  Location: [facility];  Service:               Gastroenterology;  Laterality: N/A; No date: DIAGNOSTIC LAPAROSCOPY No date: FRACTURE SURGERY; Left     Comment:  wrist 2010: HERNIA REPAIR     Comment:  umbilical No date: JOINT REPLACEMENT; Bilateral 12/30/2016: KNEE CLOSED REDUCTION; Left     Comment:  Procedure: CLOSED MANIPULATION KNEE;  Surgeon: Kennedy Bucker, MD;  Location: [facility] ORS;  Service: Orthopedics;                Laterality: Left; No date: LAPAROTOMY No date: TONSILLECTOMY 07/15/2016: TOTAL KNEE ARTHROPLASTY; Right     Comment:  Procedure: TOTAL KNEE ARTHROPLASTY;  Surgeon: Kennedy Bucker, MD;  Location: [facility] ORS;  Service: Orthopedics;                Laterality: Right; 10/26/2016: TOTAL KNEE ARTHROPLASTY; Left     Comment:  Procedure: TOTAL KNEE ARTHROPLASTY;  Surgeon: Kennedy Bucker, MD;  Location: [facility] ORS;  Service: Orthopedics;                Laterality: Left;  BMI    Body Mass Index: 38.97 kg/m      Reproductive/Obstetrics negative OB ROS                             Anesthesia Physical Anesthesia Plan  ASA: 3  Anesthesia Plan: General   Post-op Pain Management:    Induction: Intravenous  PONV Risk Score and Plan: Propofol infusion and TIVA  Airway Management Planned: Oral ETT  Additional Equipment:   Intra-op Plan:   Post-operative Plan: Extubation in OR  Informed Consent: I have reviewed the patients History and Physical, chart, labs and discussed the procedure including the risks, benefits and alternatives for the proposed anesthesia with the patient or authorized representative who has indicated his/her understanding and acceptance.     Dental Advisory Given  Plan Discussed with: CRNA and Surgeon  Anesthesia Plan Comments:         Anesthesia Quick Evaluation

## 2023-03-23 NOTE — Interval H&P Note (Signed)
History and Physical Interval Note:  03/23/2023 10:06 AM  Lisa Hawkins  has presented today for surgery, with the diagnosis of G95.9 cervical myelopathy M48.02 cervical stenosis.  The various methods of treatment have been discussed with the patient and family. After consideration of risks, benefits and other options for treatment, the patient has consented to  Procedure(s): C3-6 ANTERIOR CERVICAL DISCECTOMY AND FUSION (N/A) as a surgical intervention.  The patient's history has been reviewed, patient examined, no change in status, stable for surgery.  I have reviewed the patient's chart and labs.  Questions were answered to the patient's satisfaction.    Heart sounds normal no MRG. Chest Clear to Auscultation Bilaterally.  [name]

## 2023-03-23 NOTE — Progress Notes (Signed)
  Perioperative Services Pre-Admission/Anesthesia Testing    Date: 03/18/23  Name: Lisa Hawkins MRN:   [id number]  Re: (+) UDS and plans for repeat  Planned Surgical Procedure(s):    Case: [id number] Date/Time: 03/23/23 1056   Procedure: C3-6 ANTERIOR CERVICAL DISCECTOMY AND FUSION   Anesthesia type: General   Pre-op diagnosis:      G95.9 cervical myelopathy     M48.02 cervical stenosis   Location: [facility] OR ROOM 03 / [facility]   Surgeons: Venetia Night, MD      Clinical Notes:  Patient is scheduled for the above procedure on 03/23/2023 with Dr. Venetia Night, MD. preparation for procedure, patient presented to the PAT clinic on 03/17/2023 for preoperative testing.  Given patient's history of cocaine abuse, preoperative labs included a UDS.  Results of this testing was positive.  Reached out to primary attending surgeon to discuss.  MD office advising that MD does not wish to cancel case, rather he desires for patient to be retested on the day of surgery to assess for resolution.  Patient is experiencing myelopathy and surgeon wishes to proceed if possible.  Following my communication with her surgeons office, MD and his office staff have attempted on multiple occasions to reach out to patient to discuss results and need for case cancellation should repeat UDS to be positive.  In efforts to avoid same-day cancellation, I attempted to reach out to patient today to discuss having her return to the clinic on Monday or Tuesday to have her UDS repeated.  Unable to touch base with patient.  Unable to leave a message.  Surgeons office has been updated.  Will reattempt early next week to contact patient.  If unable to contact her, repeat UDS will unfortunately have to be repeated on the day of surgery.  If found to be positive on the day of surgery, surgeon will need to determine whether or not the case needs to proceed on an urgent/emergent basis, and if not, it would  need to be postponed pending a negative UDS.  Quentin Mulling, MSN, APRN, FNP-C, CEN [facility]  Peri-operative Services Nurse Practitioner Phone: [phone]  NOTE: This note has been prepared using Dragon dictation software. Despite my best ability to proofread, there is always the potential that unintentional transcriptional errors may still occur from this process.

## 2023-03-23 NOTE — Progress Notes (Signed)
Notified Dr. Lorette Ang of patient blood pressure still being elevated, Pre-op was 160/96 and currently 160/91, pain is more controlled. Per Dr. Lorette Ang patient is okay to go to the floor due to her preoperative blood pressure being elevated. Verbal order given from Dr. Myer Haff to restart daily blood pressure medication today, rather than tomorrow. It is documented from pre op that she did not take norvasc and HCTZ prior to surgery. Was ordered and given 20 mg of labetolol in PACU.

## 2023-03-23 NOTE — Progress Notes (Signed)
  Perioperative Services Pre-Admission/Anesthesia Testing    Date: 03/20/2022  Name: Lisa Hawkins MRN:   [id number]  Re: Positive UDS and plans for repeat  Planned Surgical Procedure(s):    Case: [id number] Date/Time: 03/23/23 1056   Procedure: C3-6 ANTERIOR CERVICAL DISCECTOMY AND FUSION   Anesthesia type: General   Pre-op diagnosis:      G95.9 cervical myelopathy     M48.02 cervical stenosis   Location: [facility] OR ROOM 03 / [facility]   Surgeons: Venetia Night, MD      Clinical Notes:  Patient is scheduled for the above procedure on 03/23/2023 with Dr. Venetia Night, MD. In preparation for procedure, patient presented to the PAT clinic on 03/17/2023 for preoperative testing.  Given patient's history of cocaine abuse, preoperative labs included a UDS.  Results of this testing was positive.    In efforts to avoid same-day cancellation, I attempted to reach out to patient again today to discuss having her return to the clinic on today or tomorrow to have her UDS repeated.  Unable to touch base with patient.  Unable to leave a message.  Surgeons office has been updated.  Will reattempt connection with patient to discuss POC.  If unable to contact her, repeat UDS will unfortunately have to be repeated on the day of surgery.  If found to be positive on the day of surgery, surgeon will need to determine whether or not the case needs to proceed on an urgent/emergent basis, and if not, it would need to be postponed pending a negative UDS.  Quentin Mulling, MSN, APRN, FNP-C, CEN [facility]  Peri-operative Services Nurse Practitioner Phone: [phone]  NOTE: This note has been prepared using Dragon dictation software. Despite my best ability to proofread, there is always the potential that unintentional transcriptional errors may still occur from this process.

## 2023-03-23 NOTE — Anesthesia Procedure Notes (Signed)
Procedure Name: Intubation Date/Time: 03/23/2023 1:00 AM  Performed by: Lynden Oxford, CRNAPre-anesthesia Checklist: Patient identified, Emergency Drugs available, Suction available and Patient being monitored Patient Re-evaluated:Patient Re-evaluated prior to induction Oxygen Delivery Method: Circle system utilized Preoxygenation: Pre-oxygenation with 100% oxygen Induction Type: IV induction Ventilation: Mask ventilation without difficulty Laryngoscope Size: McGraph and 3 Grade View: Grade I Tube type: Oral Tube size: 6.5 mm Number of attempts: 1 Airway Equipment and Method: Stylet and Video-laryngoscopy Placement Confirmation: ETT inserted through vocal cords under direct vision, positive ETCO2 and breath sounds checked- equal and bilateral Secured at: 20 cm Tube secured with: Tape Dental Injury: Teeth and Oropharynx as per pre-operative assessment

## 2023-03-23 NOTE — Progress Notes (Addendum)
During PAT visit pt was instructed to remove all jewelry due to burn risk. Pt proceeded to say that she cannot remove round stud in mid outer part of her left ear. I instructed her that this is for safety reasons to prevent burns. Pt still states that she is unable to remove. Informed [place] and [employer] [place] that pt cannot remove this.

## 2023-03-23 NOTE — Progress Notes (Signed)
Updated patient family. 

## 2023-03-23 NOTE — Discharge Instructions (Signed)
Your surgeon has performed an operation on your cervical spine (neck) to relieve pressure on the spinal cord and/or nerves. This involved making an incision in the front of your neck and removing one or more of the discs that support your spine. Next, a small piece of bone, a titanium plate, and screws were used to fuse two or more of the vertebrae (bones) together.  The following are instructions to help in your recovery once you have been discharged from the hospital. Even if you feel well, it is important that you follow these activity guidelines. If you do not let your neck heal properly from the surgery, you can increase the chance of return of your symptoms and other complications.  * Do not take anti-inflammatory medications for 3 months after surgery (naproxen [Aleve], ibuprofen [Advil, Motrin], etc.). These medications can prevent your bones from healing properly.  Celebrex, if prescribed, is ok to take.  Activity    No bending, lifting, or twisting ("BLT"). Avoid lifting objects heavier than 10 pounds (gallon milk jug).  Where possible, avoid household activities that involve lifting, bending, reaching, pushing, or pulling such as laundry, vacuuming, grocery shopping, and childcare. Try to arrange for help from friends and family for these activities while your back heals.  Increase physical activity slowly as tolerated.  Taking short walks is encouraged, but avoid strenuous exercise. Do not jog, run, bicycle, lift weights, or participate in any other exercises unless specifically allowed by your doctor.  Talk to your doctor before resuming sexual activity.  You should not drive until cleared by your doctor.  Until released by your doctor, you should not return to work or school.  You should rest at home and let your body heal.   You may shower three days after your surgery.  After showering, lightly dab your incision dry. Do not take a tub bath or go swimming until approved by your  doctor at your follow-up appointment.  If your doctor ordered a cervical collar (neck brace) for you, you should wear it whenever you are out of bed. You may remove it when lying down or sleeping, but you should wear it at all other times. Not all neck surgeries require a cervical collar.  If you smoke, we strongly recommend that you quit.  Smoking has been proven to interfere with normal bone healing and will dramatically reduce the success rate of your surgery. Please contact [facility] (800-QUIT-NOW) and use the resources at www.[facility].com for assistance in stopping smoking.  Surgical Incision   If you have a dressing on your incision, you may remove it two days after your surgery. Keep your incision area clean and dry.  If you have staples or stitches on your incision, you should have a follow up scheduled for removal. If you do not have staples or stitches, you will have steri-strips (small pieces of surgical tape) or Dermabond glue. The steri-strips/glue should begin to peel away within about a week (it is fine if the steri-strips fall off before then). If the strips are still in place one week after your surgery, you may gently remove them.  Diet           You may return to your usual diet. However, you may experience discomfort when swallowing in the first month after your surgery. This is normal. You may find that softer foods are more comfortable for you to swallow. Be sure to stay hydrated.  When to Contact Us  You may experience pain in your   neck and/or pain between your shoulder blades. This is normal and should improve in the next few weeks with the help of pain medication, muscle relaxers, and rest. Some patients report that a warm compress on the back of the neck or between the shoulder blades helps.  However, should you experience any of the following, contact us immediately: New numbness or weakness Pain that is progressively getting worse, and is not relieved by your pain  medication, muscle relaxers, rest, and warm compresses Bleeding, redness, swelling, pain, or drainage from surgical incision Chills or flu-like symptoms Fever greater than 101.0 F (38.3 C) Inability to eat, drink fluids, or take medications Problems with bowel or bladder functions Difficulty breathing or shortness of breath Warmth, tenderness, or swelling in your calf Contact Information During office hours (Monday-Friday 9 am to 5 pm), please call your physician at [phone] and ask for Kendelyn Jean After hours and weekends, please call [phone] and speak with the neurosurgeon on call For a life-threatening emergency, call 911  

## 2023-03-23 NOTE — Transfer of Care (Signed)
Immediate Anesthesia Transfer of Care Note  Patient: Lisa Hawkins  Procedure(s) Performed: C3-6 ANTERIOR CERVICAL DISCECTOMY AND FUSION (Spine Cervical)  Patient Location: PACU  Anesthesia Type:General  Level of Consciousness: drowsy  Airway & Oxygen Therapy: Patient Spontanous Breathing and Patient connected to face mask oxygen  Post-op Assessment: Report given to RN and Post -op Vital signs reviewed and stable  Post vital signs: Reviewed and stable  Last Vitals:  Vitals Value Taken Time  BP 169/82 03/23/23 1400  Temp    Pulse 87 03/23/23 1403  Resp 23 03/23/23 1403  SpO2 98 % 03/23/23 1403  Vitals shown include unvalidated device data.  Last Pain:  Vitals:   03/23/23 0924  TempSrc: Temporal  PainSc: 10-Worst pain ever      Patients Stated Pain Goal: 0 (03/23/23 0924)  Complications: No notable events documented.

## 2023-03-23 NOTE — Progress Notes (Signed)
  Perioperative Services Pre-Admission/Anesthesia Testing    Date: 03/22/2023  Name: Lisa Hawkins MRN:   [id number]  Re: Positive UDS and plans for repeat  Planned Surgical Procedure(s):    Case: [id number] Date/Time: 03/23/23 1056   Procedure: C3-6 ANTERIOR CERVICAL DISCECTOMY AND FUSION   Anesthesia type: General   Pre-op diagnosis:      G95.9 cervical myelopathy     M48.02 cervical stenosis   Location: [facility] OR ROOM 03 / [facility]   Surgeons: Venetia Night, MD      Clinical Notes:  Patient is scheduled for the above procedure on tomorrow with Dr. Venetia Night, MD. In preparation for procedure, patient presented to the PAT clinic on 03/17/2023 for preoperative testing.  Given patient's history of cocaine abuse, preoperative labs included a UDS.  Results of this testing was positive.    In efforts to avoid same-day cancellation, final attempt made to contact patient regarding results and need for repeat. I have attempted to contact with her on Friday (03/18/2023), yesterday (03/21/2023), and then again today (03/22/2023). In each instance, I was unable to speak with her. Message was able to be left this time. Explained the importance of returning my call with important details regarding her surgery; no specific information left as I ws unable to determine if this was a secure messaging system. Patient never returned my call.  Surgeons office has been updated.  At this point, a repeat UDS will unfortunately have to be done tomorrow, and based on history of aberrant behavior regarding drug use, in addition to recent result (+) for cocaine, her results are likely to be positive again. If found to be positive tomorrow, surgeon will need to determine whether or not her cervical stenosis with resulting myelopathy is extensive enough to be deemed urgent/emergent. If not, despite multiple attempts to mitigate, her case will need to be postponed pending a negative  UDS.  Quentin Mulling, MSN, APRN, FNP-C, CEN [facility]  Peri-operative Services Nurse Practitioner Phone: [phone]  NOTE: This note has been prepared using Dragon dictation software. Despite my best ability to proofread, there is always the potential that unintentional transcriptional errors may still occur from this process.

## 2023-03-24 ENCOUNTER — Encounter: Payer: Self-pay | Admitting: Neurosurgery

## 2023-03-24 MED ORDER — ACETAMINOPHEN 500 MG PO TABS
ORAL_TABLET | ORAL | Status: AC
  Filled 2023-03-24: qty 2

## 2023-03-24 MED ORDER — FLUOXETINE HCL 20 MG PO CAPS
ORAL_CAPSULE | ORAL | Status: AC
  Filled 2023-03-24: qty 1

## 2023-03-24 MED ORDER — OXYCODONE HCL 5 MG PO TABS
ORAL_TABLET | ORAL | Status: AC
  Filled 2023-03-24: qty 2

## 2023-03-24 MED ORDER — POTASSIUM CHLORIDE CRYS ER 20 MEQ PO TBCR
EXTENDED_RELEASE_TABLET | ORAL | Status: AC
  Filled 2023-03-24: qty 1

## 2023-03-24 MED ORDER — METHOCARBAMOL 500 MG PO TABS: 500.0000 mg | ORAL_TABLET | Freq: Four times a day (QID) | ORAL | 0 refills | Status: DC | PRN

## 2023-03-24 MED ORDER — GABAPENTIN 300 MG PO CAPS
ORAL_CAPSULE | ORAL | Status: AC
  Filled 2023-03-24: qty 1

## 2023-03-24 MED ORDER — SENNA 8.6 MG PO TABS
ORAL_TABLET | ORAL | Status: AC
  Filled 2023-03-24: qty 1

## 2023-03-24 MED ORDER — ENOXAPARIN SODIUM 40 MG/0.4ML IJ SOSY
PREFILLED_SYRINGE | INTRAMUSCULAR | Status: AC
  Filled 2023-03-24: qty 0.4

## 2023-03-24 MED ORDER — HYDROCHLOROTHIAZIDE 25 MG PO TABS
ORAL_TABLET | ORAL | Status: AC
  Filled 2023-03-24: qty 1

## 2023-03-24 MED ORDER — DOCUSATE SODIUM 100 MG PO CAPS
ORAL_CAPSULE | ORAL | Status: AC
  Filled 2023-03-24: qty 1

## 2023-03-24 MED ORDER — SENNA 8.6 MG PO TABS: 1.0000 | ORAL_TABLET | Freq: Two times a day (BID) | ORAL | 0 refills | Status: AC

## 2023-03-24 MED ORDER — AMLODIPINE BESYLATE 5 MG PO TABS
ORAL_TABLET | ORAL | Status: AC
  Filled 2023-03-24: qty 2

## 2023-03-24 MED ORDER — OXYCODONE HCL 5 MG PO TABS: 5.0000 mg | ORAL_TABLET | Freq: Four times a day (QID) | ORAL | 0 refills | Status: DC | PRN

## 2023-03-24 NOTE — Evaluation (Signed)
Physical Therapy Evaluation Patient Details Name: Lisa Hawkins MRN: [id number] DOB: 01/10/1951 Today's Date: 03/24/2023  History of Present Illness  Patient is s/p ACDF C 3-6  Clinical Impression  Patient received sitting up in recliner. She is agreeable to PT assessment. Patient required min A to don neck brace. Supervision for sit to stand and ambulation of 300 feet. Patient requires supervision to min guard for stair negotiation due to inability to look down to see steps. She will continue to benefit from skilled PT to ensure safety and improve strength and fall prevention.        Recommendations for follow up therapy are one component of a multi-disciplinary discharge planning process, led by the attending physician.  Recommendations may be updated based on patient status, additional functional criteria and insurance authorization.  Follow Up Recommendations       Assistance Recommended at Discharge PRN  Patient can return home with the following  A little help with walking and/or transfers;Help with stairs or ramp for entrance;Assistance with cooking/housework;Assist for transportation    Equipment Recommendations None recommended by PT  Recommendations for Other Services       Functional Status Assessment Patient has had a recent decline in their functional status and demonstrates the ability to make significant improvements in function in a reasonable and predictable amount of time.     Precautions / Restrictions Precautions Precautions: Cervical Precaution Booklet Issued: No Restrictions Weight Bearing Restrictions: No      Mobility  Bed Mobility               General bed mobility comments: NT patient in recliner    Transfers Overall transfer level: Needs assistance Equipment used: Rolling walker (2 wheels) Transfers: Sit to/from Stand Sit to Stand: Min guard                Ambulation/Gait Ambulation/Gait assistance: Supervision, Min  guard Gait Distance (Feet): 300 Feet Assistive device: Rolling walker (2 wheels) Gait Pattern/deviations: Step-through pattern Gait velocity: WNL     General Gait Details: patient mobilizing well with RW and supervision  Stairs Stairs: Yes Stairs assistance: Supervision Stair Management: Two rails, Forwards, Alternating pattern Number of Stairs: 4 General stair comments: safe on steps, requires supervision as she cannot look down to visualize steps with neck brace donned.  Wheelchair Mobility    Modified Rankin (Stroke Patients Only)       Balance Overall balance assessment: Modified Independent                                           Pertinent Vitals/Pain Pain Assessment Pain Assessment: Faces Faces Pain Scale: Hurts little more Pain Location: neck Pain Descriptors / Indicators: Discomfort, Sore Pain Intervention(s): Monitored during session    Home Living Family/patient expects to be discharged to:: Private residence Living Arrangements: Spouse/significant other Available Help at Discharge: Family;Available 24 hours/day Type of Home: House Home Access: Stairs to enter   [employer] of Steps: several with single rail on left   Home Layout: One level Home Equipment: Agricultural consultant (2 wheels)      Prior Function Prior Level of Function : Independent/Modified Independent             Mobility Comments: was not using AD prior to admission ADLs Comments: independent     Hand Dominance        Extremity/Trunk Assessment  Upper Extremity Assessment Upper Extremity Assessment: Defer to OT evaluation    Lower Extremity Assessment Lower Extremity Assessment: Overall WFL for tasks assessed    Cervical / Trunk Assessment Cervical / Trunk Assessment: Neck Surgery  Communication   Communication: No difficulties  Cognition Arousal/Alertness: Awake/alert Behavior During Therapy: WFL for tasks assessed/performed Overall  Cognitive Status: Within Functional Limits for tasks assessed                                          General Comments      Exercises     Assessment/Plan    PT Assessment Patient needs continued PT services  PT Problem List Decreased range of motion;Decreased balance;Decreased mobility;Decreased strength       PT Treatment Interventions Gait training;Stair training;Functional mobility training;Therapeutic activities;Patient/family education;Therapeutic exercise;Balance training    PT Goals (Current goals can be found in the Care Plan section)  Acute Rehab PT Goals Patient Stated Goal: to return home PT Goal Formulation: With patient Time For Goal Achievement: 03/26/23 Potential to Achieve Goals: Good    Frequency Min 2X/week     Co-evaluation               AM-PAC PT "6 Clicks" Mobility  Outcome Measure Help needed turning from your back to your side while in a flat bed without using bedrails?: A Little Help needed moving from lying on your back to sitting on the side of a flat bed without using bedrails?: A Little Help needed moving to and from a bed to a chair (including a wheelchair)?: A Little Help needed standing up from a chair using your arms (e.g., wheelchair or bedside chair)?: A Little Help needed to walk in hospital room?: A Little Help needed climbing 3-5 steps with a railing? : A Little 6 Click Score: 18    End of Session Equipment Utilized During Treatment: Gait belt Activity Tolerance: Patient tolerated treatment well Patient left: in chair Nurse Communication: Mobility status PT Visit Diagnosis: History of falling (Z91.81);Other abnormalities of gait and mobility (R26.89);Pain Pain - part of body:  (postreior neck)    Time: 1000-1013 PT Time Calculation (min) (ACUTE ONLY): 13 min   Charges:   PT Evaluation $PT Eval Moderate Complexity: 1 Mod          Lathon Adan, PT, GCS 03/24/23,10:33 AM

## 2023-03-24 NOTE — Discharge Summary (Signed)
Physician Discharge Summary  Patient ID: Lisa Hawkins MRN: [id number] DOB/AGE: 08/16/1951 72 y.o.  Admit date: 03/23/2023 Discharge date: 03/24/2023  Admission Diagnoses: Principal Problem:   S/P cervical spinal fusion Active Problems:   Myelopathy   Cervical spinal stenosis  Discharge Diagnoses:  Principal Problem:   S/P cervical spinal fusion Active Problems:   Myelopathy   Cervical spinal stenosis   Discharged Condition: good  Hospital Course: Lisa Hawkins is a 72 yo female who presented with cervical myelopathy.  She underwent C3-6 ACDF and did well. She cleared PT and Ot on POD1 and had her drain removed.   Consults: None  Significant Diagnostic Studies: radiology: X-Ray: C3-6 ACDF  Treatments: surgery: C3-6 ACDF  Discharge Exam: Blood pressure 128/85, pulse (!) 110, temperature 97.9 F (36.6 C), temperature source Temporal, resp. rate 18, height  (1.6 m), weight 96.2 kg, SpO2 95 %. General appearance: alert and cooperative CNI MAEW 5/5 Neck soft   Disposition: Discharge disposition: 01-Home or Self Care       Discharge Instructions     Discharge patient   Complete by: As directed    Discharge disposition: 01-Home or Self Care   Discharge patient date: 03/24/2023   Incentive spirometry RT   Complete by: As directed       Allergies as of 03/24/2023       Reactions   Penicillins Swelling, Rash   Has patient had a PCN reaction causing immediate rash, facial/tongue/throat swelling, SOB or lightheadedness with hypotension: Yes Has patient had a PCN reaction causing severe rash involving mucus membranes or skin necrosis: No Has patient had a PCN reaction that required hospitalization Yes Has patient had a PCN reaction occurring within the last 10 years: No If all of the above answers are "NO", then may proceed with Cephalosporin use.        Medication List     STOP taking these medications    nicotine 21 mg/24hr patch Commonly known as:  Nicoderm CQ       TAKE these medications    abacavir-dolutegravir-lamiVUDine 600-50-300 MG tablet Commonly known as: TRIUMEQ Take 1 tablet by mouth daily. What changed: when to take this   amitriptyline 100 MG tablet Commonly known as: ELAVIL Take 1 tablet (100 mg total) by mouth at bedtime. What changed: when to take this   amLODipine 10 MG tablet Commonly known as: NORVASC Take 1 tablet (10 mg total) by mouth daily. What changed: when to take this   Aspirin 81 MG Caps Take 1 capsule by mouth daily.   atorvastatin 40 MG tablet Commonly known as: LIPITOR Take 40 mg by mouth at bedtime.   celecoxib 100 MG capsule Commonly known as: CELEBREX Take 1 capsule (100 mg total) by mouth 2 (two) times daily. What changed: when to take this   FLUoxetine 20 MG capsule Commonly known as: PROZAC Take 1 capsule (20 mg total) by mouth daily. What changed: when to take this   gabapentin 300 MG capsule Commonly known as: NEURONTIN Take 300 mg by mouth 2 (two) times daily.   hydrochlorothiazide 25 MG tablet Commonly known as: HYDRODIURIL Take 1 tablet (25 mg total) by mouth daily. What changed: when to take this   loperamide 2 MG capsule Commonly known as: IMODIUM Take 2 mg by mouth as needed for diarrhea or loose stools.   metFORMIN 500 MG 24 hr tablet Commonly known as: GLUCOPHAGE-XR Take 500 mg by mouth daily with breakfast.   methocarbamol 500 MG tablet Commonly  known as: ROBAXIN Take 1 tablet (500 mg total) by mouth every 6 (six) hours as needed for muscle spasms.   oxyCODONE 5 MG immediate release tablet Commonly known as: Oxy IR/ROXICODONE Take 1 tablet (5 mg total) by mouth every 6 (six) hours as needed for moderate pain ((score 4 to 6)).   polyethylene glycol-electrolytes 420 g solution Commonly known as: NuLYTELY At 5pm evening before colonoscopy-Fill Nulytely container to the fill line with clear liquid.  Mix well.  Drink 8 oz every 20 mins until half  contents have been completed. Continue clear liquid diet.  5 hours prior to colonoscopy resume drinking the remaining Nulytely prep drinking 8 oz every 20 mins until entire contents have been completed. So not eat or drink anything 2 hours prior to colonsocopy.   potassium chloride SA 20 MEQ tablet Commonly known as: KLOR-CON M Take 1 tablet (20 mEq total) by mouth daily.   risperiDONE 1 MG tablet Commonly known as: RISPERDAL Take 1 mg by mouth every morning.   senna 8.6 MG Tabs tablet Commonly known as: SENOKOT Take 1 tablet (8.6 mg total) by mouth 2 (two) times daily.   traZODone 100 MG tablet Commonly known as: DESYREL Take 1 tablet (100 mg total) by mouth at bedtime as needed for sleep. What changed: when to take this        Follow-up Information     Drake Leach, PA-C Follow up on 04/07/2023.   Specialty: Neurosurgery Why: 130 pm Contact information: [address] [place] [address] [phone]                 Signed: Venetia Night 03/24/2023, 5:30 PM

## 2023-03-24 NOTE — Evaluation (Signed)
Occupational Therapy Evaluation Patient Details Name: Lisa Hawkins MRN: [id number] DOB: 28-Jul-1951 Today's Date: 03/24/2023   History of Present Illness Patient is s/p ACDF C 3-6   Clinical Impression   Upon entering the room, pt seated in recliner chair and is agreeable to OT intervention. Basin of water nearby and pt reports just washing up and would like to get dressed. She endorses living at home with significant other and being Ind at baseline. He is able to assist if needed at home. OT did review cervical precautions and brace wearing instructions as well as techniques to increase Ind with self care tasks. Pt completes dressing tasks without physical assistance during this session. Pt independently ambulates in room to obtain needed items without LOB or assistance. Education completed and no further need for skilled OT intervention at this time and pt agrees.    Recommendations for follow up therapy are one component of a multi-disciplinary discharge planning process, led by the attending physician.  Recommendations may be updated based on patient status, additional functional criteria and insurance authorization.   Assistance Recommended at Discharge PRN  Patient can return home with the following Assistance with cooking/housework;Assist for transportation;Help with stairs or ramp for entrance    Functional Status Assessment  Patient has not had a recent decline in their functional status  Equipment Recommendations  None recommended by OT       Precautions / Restrictions Precautions Precautions: Cervical Precaution Booklet Issued: No Restrictions Weight Bearing Restrictions: No      Mobility Bed Mobility Overal bed mobility: Needs Assistance             General bed mobility comments: NT patient in recliner    Transfers Overall transfer level: Needs assistance Equipment used: None Transfers: Sit to/from Stand Sit to Stand: Supervision                   Balance Overall balance assessment: Modified Independent                                         ADL either performed or assessed with clinical judgement   ADL Overall ADL's : Modified independent                                       General ADL Comments: Pt had finished washing self prior to therapist arrival and demonstrates ability to dress self without physical assistance. Cues provided for figure four position to don B socks and thead LB clothing.     Vision Patient Visual Report: No change from baseline              Pertinent Vitals/Pain Pain Assessment Pain Assessment: Faces Faces Pain Scale: Hurts a little bit Pain Location: neck Pain Descriptors / Indicators: Discomfort Pain Intervention(s): Monitored during session, Repositioned     Hand Dominance Right   Extremity/Trunk Assessment Upper Extremity Assessment Upper Extremity Assessment: Overall WFL for tasks assessed   Lower Extremity Assessment Lower Extremity Assessment: Overall WFL for tasks assessed   Cervical / Trunk Assessment Cervical / Trunk Assessment: Neck Surgery   Communication Communication Communication: No difficulties   Cognition Arousal/Alertness: Awake/alert Behavior During Therapy: WFL for tasks assessed/performed Overall Cognitive Status: Within Functional Limits for tasks assessed  Home Living Family/patient expects to be discharged to:: Private residence Living Arrangements: Spouse/significant other Available Help at Discharge: Family;Available 24 hours/day Type of Home: House Home Access: Stairs to enter [employer] of Steps: several with single rail on left   Home Layout: One level     Bathroom Shower/Tub: Tub/shower unit         Home Equipment: Agricultural consultant (2 wheels)          Prior Functioning/Environment Prior Level of Function :  Independent/Modified Independent             Mobility Comments: was not using AD prior to admission ADLs Comments: independent                 OT Goals(Current goals can be found in the care plan section) Acute Rehab OT Goals Patient Stated Goal: To return home OT Goal Formulation: With patient Time For Goal Achievement: 03/24/23 Potential to Achieve Goals: Good  OT Frequency:         AM-PAC OT "6 Clicks" Daily Activity     Outcome Measure Help from another person eating meals?: None Help from another person taking care of personal grooming?: None Help from another person toileting, which includes using toliet, bedpan, or urinal?: None Help from another person bathing (including washing, rinsing, drying)?: None Help from another person to put on and taking off regular upper body clothing?: None Help from another person to put on and taking off regular lower body clothing?: None 6 Click Score: 24   End of Session Nurse Communication: Mobility status  Activity Tolerance: Patient tolerated treatment well Patient left: in chair                   Time: 8657-8469 OT Time Calculation (min): 12 min Charges:  OT General Charges $OT Visit: 1 Visit OT Evaluation $OT Eval Low Complexity: 1 Low OT Treatments $Self Care/Home Management : 8-22 mins  Jackquline Denmark, MS, OTR/L , CBIS ascom [phone]  03/24/23, 11:27 AM

## 2023-03-24 NOTE — Plan of Care (Signed)
  Problem: Activity: Goal: Ability to avoid complications of mobility impairment will improve Outcome: Progressing Goal: Ability to tolerate increased activity will improve Outcome: Progressing Goal: Will remain free from falls Outcome: Progressing   Problem: Pain Management: Goal: Pain level will decrease Outcome: Progressing   

## 2023-03-24 NOTE — Progress Notes (Signed)
    Attending Progress Note  History: Lisa L FeastWRENLEY SAYEDr cervical myelopathy.  POD1: Having expected neck discomfort.  Physical Exam: Vitals:   03/24/23 0500 03/24/23 0730  BP: (!) 156/87 (!) 160/93  Pulse: (!) 108 (!) 117  Resp: 16 20  Temp: 98.8 F (37.1 C) (!) 97.5 F (36.4 C)  SpO2: 95% 95%    AA Ox3 CNI  Strength:5/5 throughout BUE  Neck soft Incision c/d/i  Data:  Other tests/results: drain 80  Assessment/Plan:  Lisa Hawkins is doing well from anterior cervical discectomy and fusion.  She has expected neck pain.  This will improve over time.  - mobilize - pain control - DVT prophylaxis - PTOT -Monitor drain output  Venetia Night MD, [facility] Department of Neurosurgery

## 2023-03-24 NOTE — Progress Notes (Signed)
DISCHARGE NOTE:  Pt given discharge instructions and verbalized understanding. TED hose on both legs, pt wheeled to car by staff. Friend providing transportation.

## 2023-03-24 NOTE — Anesthesia Postprocedure Evaluation (Signed)
Anesthesia Post Note  Patient: Lisa Hawkins  Procedure(s) Performed: C3-6 ANTERIOR CERVICAL DISCECTOMY AND FUSION (Spine Cervical)  Patient location during evaluation: PACU Anesthesia Type: General Level of consciousness: awake and alert Pain management: pain level controlled Vital Signs Assessment: post-procedure vital signs reviewed and stable Respiratory status: spontaneous breathing, nonlabored ventilation, respiratory function stable and patient connected to nasal cannula oxygen Cardiovascular status: blood pressure returned to baseline and stable Postop Assessment: no apparent nausea or vomiting Anesthetic complications: no   No notable events documented.   Last Vitals:  Vitals:   03/24/23 0015 03/24/23 0500  BP: (!) 158/75 (!) 156/87  Pulse: (!) 103 (!) 108  Resp: 16 16  Temp: 36.6 C 37.1 C  SpO2: 95% 95%    Last Pain:  Vitals:   03/24/23 0707  TempSrc:   PainSc: 4                  Lenard Simmer

## 2023-04-06 NOTE — Progress Notes (Unsigned)
   REFERRING PHYSICIAN:  [facility], [facility] [address] [place],  [place] [address]  DOS: 03/23/23 ACDF C3-C6 for myelopathy  HISTORY OF PRESENT ILLNESS: Lisa Hawkins is 2 weeks status post ACDF C3-C6. Given oxycodone and robaxin  on discharge from the hospital.   She had a fall on Tuesday when she tripped over a dog- she's been having more pain in her neck and left shoulder since that time. She is wearing her collar. She has left shoulder pain that radiates into her left arm.   She has history of cocaine use and did test positive for it preop.     PHYSICAL EXAMINATION:  NEUROLOGICAL:  General: In no acute distress.   Awake, alert, oriented to person, place, and time.  Pupils equal round and reactive to light.  Facial tone is symmetric.    Strength: Side Biceps Triceps Deltoid Interossei Grip Wrist Ext. Wrist Flex.  R 5 5 5 5 5 5 5   L 5 5 4 5 5 5 5    Incision c/d/I  She has diffuse tenderness left proximal humerus with limited ROM of shoulder.   Imaging:  Nothing new to review.   Assessment / Plan: Lisa Hawkins is doing reasonable s/p above surgery. Treatment options reviewed with patient and following plan made:   - Continue with cervical collar. Can remove to sleep/eat.  - We discussed activity escalation and I have advised the patient to lift up to 10 pounds until 6 weeks after surgery (until follow up with Dr. Myer Haff).   - Reviewed wound care.  - Continue current medications including prn robaxin. Refill given.  - Discussed pain medications with Dr. Myer Haff. With her history of drug use, he recommends ultram for pain. She states this does not work and doesn't want it.  - Refill given on robaxin. Reviewed dosing and side effects. She knows that can make her sleepy.  - With recent fall, will get xrays of cervical spine and left shoulder. Will call her with results.  - Follow up as scheduled in 4 weeks and prn.   Advised to contact the office if  any questions or concerns arise.   Drake Leach PA-C Dept of Neurosurgery

## 2023-04-07 ENCOUNTER — Ambulatory Visit (INDEPENDENT_AMBULATORY_CARE_PROVIDER_SITE_OTHER): Payer: Medicare HMO | Admitting: Orthopedic Surgery

## 2023-04-07 ENCOUNTER — Ambulatory Visit
Admission: RE | Admit: 2023-04-07 | Discharge: 2023-04-07 | Disposition: A | Discharge status: Home / Self Care | Payer: Medicare HMO | Source: Ambulatory Visit | Attending: Orthopedic Surgery | Admitting: Orthopedic Surgery

## 2023-04-07 ENCOUNTER — Encounter: Payer: Self-pay | Admitting: Orthopedic Surgery

## 2023-04-07 VITALS — BP 140/70 | Ht 63.0 in | Wt 212.0 lb

## 2023-04-07 DIAGNOSIS — Z09 Encounter for follow-up examination after completed treatment for conditions other than malignant neoplasm: Secondary | ICD-10-CM

## 2023-04-07 DIAGNOSIS — Z981 Arthrodesis status: Secondary | ICD-10-CM | POA: Diagnosis present

## 2023-04-07 DIAGNOSIS — G959 Disease of spinal cord, unspecified: Secondary | ICD-10-CM

## 2023-04-07 DIAGNOSIS — M4802 Spinal stenosis, cervical region: Secondary | ICD-10-CM

## 2023-04-07 MED ORDER — METHOCARBAMOL 500 MG PO TABS
500.0000 mg | ORAL_TABLET | Freq: Three times a day (TID) | ORAL | 0 refills | Status: AC | PRN
Start: 2023-04-07 — End: ?

## 2023-04-11 ENCOUNTER — Telehealth: Payer: Self-pay | Admitting: Orthopedic Surgery

## 2023-04-11 NOTE — Telephone Encounter (Signed)
Please call and let her know that her neck xrays look good. No broken bones in her left shoulder. Her pain should continue to improve.      Cervical xrays dated 04/07/23:  No complications noted.   Reviewed cervical xrays with Dr. Myer Haff.   Left shoulder xrays dated 04/07/23:  FINDINGS: There degenerative changes at the [facility] joint. No acute fracture, dislocation or subluxation identified. No osteolytic or osteoblastic lesions identified.   IMPRESSION: Acromioclavicular degenerative changes.     Electronically Signed   By: Layla Maw M.D.   On: 04/09/2023 13:21  I have personally reviewed the images and agree with the above interpretation.

## 2023-04-11 NOTE — Telephone Encounter (Signed)
Notified Ms Oberg of Stacy's message. She was appreciative of the call.

## 2023-05-05 ENCOUNTER — Encounter: Payer: Medicare HMO | Admitting: Neurosurgery

## 2023-05-05 ENCOUNTER — Other Ambulatory Visit: Payer: Self-pay

## 2023-05-05 DIAGNOSIS — G959 Disease of spinal cord, unspecified: Secondary | ICD-10-CM

## 2023-05-10 ENCOUNTER — Encounter: Payer: Medicare HMO | Admitting: Neurosurgery

## 2023-05-10 NOTE — Progress Notes (Addendum)
   REFERRING PHYSICIAN:  Center, [facility] [address] [place],  [place] [address]  DOS: 03/23/23 ACDF C3-C6 for myelopathy  HISTORY OF PRESENT ILLNESS: Lisa Hawkins is about 6 weeks status post ACDF C3-C6.   She was having neck and shoulder pain at her last visit after a fall.   She continues with neck pain and pain in left shoulder. She states that she had a "black out" episode earlier today. She has these intermittently. Was seeing provider at [facility] for this but they retired. Has not seen anyone in over  years.   PHYSICAL EXAMINATION:  NEUROLOGICAL:  General: In no acute distress.   Awake, alert, oriented to person, place, and time.  Pupils equal round and reactive to light.  Facial tone is symmetric.    Strength: Side Biceps Triceps Deltoid Interossei Grip Wrist Ext. Wrist Flex.  R 5 5 5 5 5 5 5   L 4+ 4+ 4+ 5 5 5 5    She does not give good effort with strength testing in left biceps/triceps/deltoid due to pain.   She has reasonable ROM of left shoulder with no pain.   Incision well healed.     Imaging:  Cervical xrays dated 05/12/23:  No complications noted.  No radiology report for above xrays.    Assessment / Plan: Lisa Hawkins is doing reasonable s/p above surgery. She continues with left arm pain. Treatment options reviewed with patient and following plan made:   - Continue with cervical collar. Can remove to sleep/eat.  - We discussed activity escalation and I have advised the patient to lift up to 20 pounds until follow up with Dr. Myer Haff.    - Discussed that pain should continue to improve.  - Follow up with PCP regarding black out episodes. If they get worse, go to ED.   - Will check with Dr. Myer Haff to see if she can d/c collar and start cervical exercise sheet. Will call and let her know.   - Follow up in 6 weeks and prn.    ADDENDUM 05/13/23:  Reviewed with Dr. Myer Haff. Okay to wean out of collar. She can also start cervical  exercises. Will call and let her know.   Advised to contact the office if any questions or concerns arise.   Lisa Leach PA-C Dept of Neurosurgery

## 2023-05-12 ENCOUNTER — Ambulatory Visit
Admission: RE | Admit: 2023-05-12 | Discharge: 2023-05-12 | Disposition: A | Discharge status: Home / Self Care | Payer: Medicare HMO | Source: Ambulatory Visit | Attending: Neurosurgery | Admitting: Neurosurgery

## 2023-05-12 ENCOUNTER — Ambulatory Visit
Admission: RE | Admit: 2023-05-12 | Discharge: 2023-05-12 | Disposition: A | Discharge status: Home / Self Care | Payer: Medicare HMO | Attending: Neurosurgery | Admitting: Neurosurgery

## 2023-05-12 ENCOUNTER — Ambulatory Visit (INDEPENDENT_AMBULATORY_CARE_PROVIDER_SITE_OTHER): Payer: Medicare HMO | Admitting: Orthopedic Surgery

## 2023-05-12 ENCOUNTER — Encounter: Payer: Self-pay | Admitting: Orthopedic Surgery

## 2023-05-12 VITALS — BP 130/68 | Temp 99.0°F | Ht 63.0 in | Wt 212.0 lb

## 2023-05-12 DIAGNOSIS — Z981 Arthrodesis status: Secondary | ICD-10-CM

## 2023-05-12 DIAGNOSIS — G959 Disease of spinal cord, unspecified: Secondary | ICD-10-CM | POA: Diagnosis present

## 2023-05-12 DIAGNOSIS — Z09 Encounter for follow-up examination after completed treatment for conditions other than malignant neoplasm: Secondary | ICD-10-CM

## 2023-05-12 DIAGNOSIS — M4802 Spinal stenosis, cervical region: Secondary | ICD-10-CM

## 2023-05-12 NOTE — Patient Instructions (Signed)
It was nice to see you today.   I am sorry you are not feeling better yet.   Keep wearing collar until I call you.   Do not start exercises until I tell you to do so.   Follow up with PCP about the black outs. Go to emergency room if you get worse.   Please call with any questions or concerns.   Drake Leach PA-C [phone]

## 2023-05-18 ENCOUNTER — Telehealth: Payer: Self-pay | Admitting: Orthopedic Surgery

## 2023-05-18 NOTE — Telephone Encounter (Signed)
Patient was seen in the clinic on 6/13.  Per note: "Will check with Dr. Myer Haff to see if she can d/c collar and start cervical exercise sheet. Will call and let her know."  Patient called the office today requesting a call back with Dr. Lucienne Capers recommendation.   Patient can be reached at [phone].

## 2023-05-18 NOTE — Telephone Encounter (Signed)
Please tell her I'm sorry for the delay.   Reviewed with Dr. Myer Haff. Okay to wean out of collar. She can also start cervical exercises that I gave her.   Thanks.

## 2023-06-14 ENCOUNTER — Encounter: Payer: Medicare HMO | Admitting: Orthopedic Surgery

## 2023-06-15 ENCOUNTER — Other Ambulatory Visit: Payer: Self-pay

## 2023-06-15 DIAGNOSIS — G959 Disease of spinal cord, unspecified: Secondary | ICD-10-CM

## 2023-06-16 ENCOUNTER — Encounter: Payer: Medicare HMO | Admitting: Neurosurgery

## 2023-06-24 NOTE — Progress Notes (Unsigned)
   REFERRING PHYSICIAN:  Center, [facility] [address] [place],  [place] [address]  DOS: 03/23/23 ACDF C3-C6 for myelopathy  HISTORY OF PRESENT ILLNESS: LEYANNA MOTHERWAY is 3 months out from her surgery.   She was having neck and left shoulder pain at her last visit. She was to wean out of her collar and start cervical exercises that I gave her.   She states she was doing better for a few days and then got worse. She has been wearing her collar since her last visit and is not weaning out of it. She notes constant neck pain with radiation to left arm. 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   She is taking celebrex, robaxin, and neurontin.   PHYSICAL EXAMINATION:  NEUROLOGICAL:  General: In no acute distress.   Awake, alert, oriented to person, place, and time.  Pupils equal round and reactive to light.  Facial tone is symmetric.    Strength: Side Biceps Triceps Deltoid Interossei Grip Wrist Ext. Wrist Flex.  R 5 5 5 5 5 5 5   L 5 5 5 5 5 5 5    She has pain with above strength testing.   She has limited/painful ROM of her left shoulder.   Incision well healed.   Strength:  Side Iliopsoas Quads Hamstring PF DF EHL  R 5 5 5 5 5 5   L 5 5 5 5 5 5    Hoffman's is absent.   Bilateral upper and lower extremity sensation is intact to light touch.       Imaging:  Cervical xrays dated 06/28/23:  No complications noted.  Above xrays reviewed with Dr. Myer Haff.   No radiology report for above xrays.    Assessment / Plan: AJAYA EVERAGE continues with pain s/p above surgery. Treatment options reviewed with patient and following plan made:   - Wean out of collar. Discussed she needs to get her muscles used to holding up her head again.  - Medrol dose pack for symptom relief. Reviewed dosing and side effects. Hold celebrex while taking this.  - Referral to ortho at [facility] for left shoulder pain.  - Follow up with Dr. Myer Haff in  3 months and again in 6 months. Will need xrays.  - Will let me know if no improvement with dose pack.   Above plan made with Dr. Myer Haff.   I spent a total of 20 minutes in face-to-face and non-face-to-face activities related to this patient's care today including review of outside records, review of imaging, review of symptoms, physical exam, discussion of differential diagnosis, discussion of treatment options, and documentation.   Drake Leach PA-C Dept of Neurosurgery

## 2023-06-28 ENCOUNTER — Encounter: Payer: Self-pay | Admitting: Orthopedic Surgery

## 2023-06-28 ENCOUNTER — Ambulatory Visit
Admission: RE | Admit: 2023-06-28 | Discharge: 2023-06-28 | Disposition: A | Discharge status: Home / Self Care | Payer: Medicare HMO | Attending: Neurosurgery | Admitting: Neurosurgery

## 2023-06-28 ENCOUNTER — Ambulatory Visit (INDEPENDENT_AMBULATORY_CARE_PROVIDER_SITE_OTHER): Payer: Medicare HMO | Admitting: Orthopedic Surgery

## 2023-06-28 ENCOUNTER — Ambulatory Visit
Admission: RE | Admit: 2023-06-28 | Discharge: 2023-06-28 | Disposition: A | Discharge status: Home / Self Care | Payer: Medicare HMO | Source: Ambulatory Visit | Attending: Neurosurgery | Admitting: Neurosurgery

## 2023-06-28 VITALS — BP 128/82 | Temp 98.4°F | Ht 63.0 in | Wt 209.0 lb

## 2023-06-28 DIAGNOSIS — Z09 Encounter for follow-up examination after completed treatment for conditions other than malignant neoplasm: Secondary | ICD-10-CM

## 2023-06-28 DIAGNOSIS — G959 Disease of spinal cord, unspecified: Secondary | ICD-10-CM | POA: Diagnosis present

## 2023-06-28 DIAGNOSIS — Z981 Arthrodesis status: Secondary | ICD-10-CM

## 2023-06-28 DIAGNOSIS — M25512 Pain in left shoulder: Secondary | ICD-10-CM | POA: Diagnosis not present

## 2023-06-28 MED ORDER — METHYLPREDNISOLONE 4 MG PO TBPK: ORAL_TABLET | ORAL | 0 refills | Status: DC

## 2023-06-28 NOTE — Patient Instructions (Signed)
It was so nice to see you today. Thank you so much for coming in.    Your neck xrays look good.   I think your left shoulder pain is from your shoulder. I want you to see ortho at the [facility] for evaluation of your left shoulder. They should call you to schedule an appointment or you can call them at [phone].   I sent a prescription for a steroid dose pack to help with pain and inflammation. Take as directed. Stop celebrex when taking this. You can restart celebrex when you are done with the steroids.   Start to wean out of the collar. We need to get your neck muscles used to holding up your head again.   You have a follow up with Dr. Myer Haff in 3 months and in 6 months. Will need xrays before these visits.   Please do not hesitate to call if you have any questions or concerns. You can also message me in MyChart.   Drake Leach PA-C [phone]

## 2023-09-23 ENCOUNTER — Other Ambulatory Visit: Payer: Self-pay | Admitting: Family Medicine

## 2023-09-23 DIAGNOSIS — G959 Disease of spinal cord, unspecified: Secondary | ICD-10-CM

## 2023-09-27 ENCOUNTER — Ambulatory Visit
Admission: RE | Admit: 2023-09-27 | Discharge: 2023-09-27 | Disposition: A | Discharge status: Home / Self Care | Payer: Medicare HMO | Attending: Neurosurgery | Admitting: Neurosurgery

## 2023-09-27 ENCOUNTER — Ambulatory Visit
Admission: RE | Admit: 2023-09-27 | Discharge: 2023-09-27 | Disposition: A | Discharge status: Home / Self Care | Payer: Medicare HMO | Source: Ambulatory Visit | Attending: Neurosurgery | Admitting: Neurosurgery

## 2023-09-27 ENCOUNTER — Ambulatory Visit (INDEPENDENT_AMBULATORY_CARE_PROVIDER_SITE_OTHER): Payer: Medicare HMO | Admitting: Neurosurgery

## 2023-09-27 VITALS — BP 128/78 | Ht 63.0 in | Wt 209.0 lb

## 2023-09-27 DIAGNOSIS — M960 Pseudarthrosis after fusion or arthrodesis: Secondary | ICD-10-CM

## 2023-09-27 DIAGNOSIS — Z981 Arthrodesis status: Secondary | ICD-10-CM

## 2023-09-27 DIAGNOSIS — M542 Cervicalgia: Secondary | ICD-10-CM | POA: Diagnosis not present

## 2023-09-27 DIAGNOSIS — G959 Disease of spinal cord, unspecified: Secondary | ICD-10-CM | POA: Diagnosis present

## 2023-09-27 NOTE — Progress Notes (Signed)
   REFERRING PHYSICIAN:  Center, [facility] [address] [place],  [place] [address]  DOS: 03/23/23 ACDF C3-C6 for myelopathy  HISTORY OF PRESENT ILLNESS: Lisa Hawkins continues to have issues with chronic myelopathy such as difficulty walking.  Additionally, she is having neck pain.  She also has low back pain.  PHYSICAL EXAMINATION:  NEUROLOGICAL:  General: In no acute distress.   Awake, alert, oriented to person, place, and time.  Pupils equal round and reactive to light.  Facial tone is symmetric.    Strength: Side Biceps Triceps Deltoid Interossei Grip Wrist Ext. Wrist Flex.  R 5 5 5 5 5 5 5   L 5 5 5 5 5 5 5    She has pain with above strength testing.     Incision well healed.   Strength:  Side Iliopsoas Quads Hamstring PF DF EHL  R 5 5 5 5 5 5   L 5 5 5 5 5 5    Hoffman's is absent.   Bilateral upper and lower extremity sensation is intact to light touch.       Imaging:  Cervical xrays dated 06/28/23:  No complications noted.  Cervical spine x-rays today-no change in position, no complications noted   Assessment / Plan: Lisa Hawkins continues with pain s/p above surgery.   At this point, I think it is reasonable to consider CT and MRI scan of the cervical spine as she still has issues with chronic myelopathic symptoms as well as chronic neck pain.  She has a pseudoarthrosis, we will revisit options for her.  I recommended physical therapy for her back and neck.  We discussed referral to pain management.  I will put in a referral for her.    I spent a total of 15 minutes in this patient's care today. This time was spent reviewing pertinent records including imaging studies, obtaining and confirming history, performing a directed evaluation, formulating and discussing my recommendations, and documenting the visit within the medical record.   Venetia Night MD Dept of Neurosurgery

## 2023-09-29 NOTE — Addendum Note (Signed)
Addended by: Ernie Hew on: 09/29/2023 09:44 AM   Modules accepted: Orders

## 2023-11-01 ENCOUNTER — Ambulatory Visit: Payer: Medicare HMO | Admitting: Student in an Organized Health Care Education/Training Program

## 2023-11-01 ENCOUNTER — Encounter: Payer: Self-pay | Admitting: Neurosurgery

## 2023-11-14 ENCOUNTER — Ambulatory Visit
Admission: RE | Admit: 2023-11-14 | Discharge: 2023-11-14 | Disposition: A | Discharge status: Home / Self Care | Payer: Medicare HMO | Source: Ambulatory Visit | Attending: Neurosurgery | Admitting: Neurosurgery

## 2023-11-14 DIAGNOSIS — M542 Cervicalgia: Secondary | ICD-10-CM

## 2023-11-14 DIAGNOSIS — Z981 Arthrodesis status: Secondary | ICD-10-CM

## 2023-11-14 DIAGNOSIS — G959 Disease of spinal cord, unspecified: Secondary | ICD-10-CM

## 2023-11-26 ENCOUNTER — Other Ambulatory Visit: Payer: Medicare HMO

## 2023-12-06 ENCOUNTER — Ambulatory Visit: Payer: Medicare HMO | Attending: Neurosurgery

## 2023-12-06 DIAGNOSIS — Z9181 History of falling: Secondary | ICD-10-CM | POA: Insufficient documentation

## 2023-12-06 DIAGNOSIS — M79602 Pain in left arm: Secondary | ICD-10-CM | POA: Insufficient documentation

## 2023-12-06 DIAGNOSIS — M5412 Radiculopathy, cervical region: Secondary | ICD-10-CM | POA: Insufficient documentation

## 2023-12-06 DIAGNOSIS — G959 Disease of spinal cord, unspecified: Secondary | ICD-10-CM | POA: Insufficient documentation

## 2023-12-06 DIAGNOSIS — M542 Cervicalgia: Secondary | ICD-10-CM | POA: Insufficient documentation

## 2023-12-06 DIAGNOSIS — M6281 Muscle weakness (generalized): Secondary | ICD-10-CM | POA: Insufficient documentation

## 2023-12-06 DIAGNOSIS — R262 Difficulty in walking, not elsewhere classified: Secondary | ICD-10-CM | POA: Insufficient documentation

## 2023-12-06 DIAGNOSIS — Z981 Arthrodesis status: Secondary | ICD-10-CM | POA: Insufficient documentation

## 2023-12-06 NOTE — Therapy (Signed)
 OUTPATIENT PHYSICAL THERAPY CERVICAL EVALUATION   Patient Name: Lisa Hawkins MRN: [id number] DOB:1951-09-14, 73 y.o., female Today's Date: 12/06/2023  END OF SESSION:  PT End of Session - 12/06/23 1439     Visit Number 1    Number of Visits 17    Date for PT Re-Evaluation 02/03/24    PT Start Time 1439    PT Stop Time 1521    PT Time Calculation (min) 42 min    Activity Tolerance Patient limited by pain    Behavior During Therapy [facility] for tasks assessed/performed             Past Medical History:  Diagnosis Date   Anxiety    Arthritis    Bronchitis    Chronic bilateral low back pain with bilateral sciatica 05/19/2020   Chronic pain syndrome    Cocaine use    Complication of anesthesia    Coronary artery disease    Dyspnea    with exertion   Dysrhythmia    hx of flutter, no longer present   Elevated lipids    GERD (gastroesophageal reflux disease)    Hepatitis    Hep C - treated with Harvoni   History of methicillin resistant staphylococcus aureus (MRSA) 2018   HIV (human immunodeficiency virus infection) (HCC)    1999   HOH (hard of hearing)    Hypertension    Hypokalemia    Hypomagnesemia    Intentional overdose (HCC)    Kidney lesion    Myocardial infarction (HCC) 1993   Neuropathy    Obesity    PONV (postoperative nausea and vomiting)    Seizures (HCC)    not considered seizures but does black out and unsure of what happened. happens when she overheats. last episode 4 days ago.   Stroke [facility]) 1990   no defecits   Suicidal ideation 2023   Tobacco use disorder    Past Surgical History:  Procedure Laterality Date   ABDOMINAL HYSTERECTOMY  1970   ANTERIOR CERVICAL DECOMP/DISCECTOMY FUSION N/A 03/23/2023   Procedure: C3-6 ANTERIOR CERVICAL DISCECTOMY AND FUSION;  Surgeon: Clois Fret, MD;  Location: [facility] ORS;  Service: Neurosurgery;  Laterality: N/A;   BACK SURGERY     no metal-Lumbar   CATARACT EXTRACTION W/PHACO Right 06/21/2017    Procedure: CATARACT EXTRACTION PHACO AND INTRAOCULAR LENS PLACEMENT (IOC);  Surgeon: Jaye Fallow, MD;  Location: [facility] ORS;  Service: Ophthalmology;  Laterality: Right;  US  00:30.9 AP% 12.7 CDE 3.94 Fluid Pack Lot # [id number]   CATARACT EXTRACTION W/PHACO Left 07/12/2017   Procedure: CATARACT EXTRACTION PHACO AND INTRAOCULAR LENS PLACEMENT (IOC);  Surgeon: Jaye Fallow, MD;  Location: [facility] ORS;  Service: Ophthalmology;  Laterality: Left;  US   00:42 AP% 15.2 CDE 6.38 Fluid pack lot # [id number]   COLONOSCOPY WITH PROPOFOL  N/A 12/09/2020   Procedure: COLONOSCOPY WITH PROPOFOL ;  Surgeon: Therisa Bi, MD;  Location: [facility] ENDOSCOPY;  Service: Gastroenterology;  Laterality: N/A;   COLONOSCOPY WITH PROPOFOL  N/A 09/06/2022   Procedure: COLONOSCOPY WITH PROPOFOL ;  Surgeon: Therisa Bi, MD;  Location: [facility] ENDOSCOPY;  Service: Gastroenterology;  Laterality: N/A;   DIAGNOSTIC LAPAROSCOPY     FRACTURE SURGERY Left    wrist   HERNIA REPAIR  2010   umbilical   KNEE CLOSED REDUCTION Left 12/30/2016   Procedure: CLOSED MANIPULATION KNEE;  Surgeon: Ozell Flake, MD;  Location: [facility] ORS;  Service: Orthopedics;  Laterality: Left;   LAPAROTOMY     TONSILLECTOMY     TOTAL KNEE ARTHROPLASTY Right  07/15/2016   Procedure: TOTAL KNEE ARTHROPLASTY;  Surgeon: Ozell Flake, MD;  Location: [facility] ORS;  Service: Orthopedics;  Laterality: Right;   TOTAL KNEE ARTHROPLASTY Left 10/26/2016   Procedure: TOTAL KNEE ARTHROPLASTY;  Surgeon: Ozell Flake, MD;  Location: [facility] ORS;  Service: Orthopedics;  Laterality: Left;   Patient Active Problem List   Diagnosis Date Noted   Myelopathy (HCC) 03/23/2023   Cervical spinal stenosis 03/23/2023   S/P cervical spinal fusion 03/23/2023   MDD (major depressive disorder), recurrent severe, without psychosis (HCC) 03/02/2022   Drug overdose, intentional (HCC) 02/28/2022   Suicide attempt (HCC) 02/28/2022   Hypokalemia 02/28/2022   Hypomagnesemia 02/28/2022   Overdose  02/28/2022   Class 3 severe obesity with serious comorbidity and body mass index (BMI) of 45.0 to 49.9 in adult (HCC) 06/08/2020   Elevated sed rate 06/08/2020   Elevated C-reactive protein (CRP) 06/08/2020   Abnormal MRI, lumbar spine (02/04/2020) 05/19/2020   Kidney lesion (Left) 05/19/2020   Osteoarthritis of knee (Bilateral) 05/19/2020   Lumbar Grade 1 Anterolisthesis of L4/L5 05/19/2020   Lumbar facet arthropathy (Multilevel) (Bilateral) 05/19/2020   Lumbar foraminal stenosis (Multilevel) (Bilateral) 05/19/2020   Lumbar lateral recess stenosis (L3-4) (Right) 05/19/2020   Lumbar central spinal stenosis w/o neurogenic claudication (L4-5) 05/19/2020   DDD (degenerative disc disease), lumbosacral 05/19/2020   Lumbosacral IVDD (intervertebral disc displacement) 05/19/2020   History of cocaine use 05/19/2020   Chronic low back pain (1ry area of Pain) (Bilateral) (R>L) w/ sciatica (Bilateral) 05/19/2020   Chronic lower extremity pain (2ry area of Pain) (Bilateral) (R>L) 05/19/2020   Chronic knee pain after total knee replacement (Bilateral) (L>R) 05/19/2020   Chronic hip pain (3ry area of Pain) (Bilateral) (R>L) 05/19/2020   Chronic sacroiliac joint pain (Bilateral) (R>L) 05/19/2020   Lumbar facet syndrome (Bilateral) (R>L) 05/19/2020   Chronic pain syndrome 05/18/2020   Encounter for screening colonoscopy 05/18/2020   Disorder of skeletal system 05/18/2020   Problems influencing health status 05/18/2020   Trigger ring finger of left hand 01/25/2017   Altered mental status 10/28/2016   Wheezing 10/28/2016   Dyspnea 10/28/2016   Acute respiratory failure with hypoxia (HCC) 10/28/2016   Hyponatremia 10/28/2016   Hepatomegaly 10/28/2016   Osteoarthritis of knee (Left) 10/26/2016   Osteoarthritis of knee (Right) 07/15/2016   Abnormal ECG 05/17/2016   Chronic knee pain (Bilateral) 05/17/2016   Exertional shortness of breath 03/30/2016   Compensated cirrhosis related to hepatitis C virus  (HCV) (HCC) 12/22/2015   Kidney disease 06/18/2014   Tinea 03/07/2014   Routine adult health maintenance 11/26/2013   Tobacco use disorder 09/30/2013   Dizziness 09/28/2013   Hypertension 09/28/2013   Obesity 09/28/2013   Onychomycosis 09/28/2013   Osteoarthritis, knee (Left) 09/28/2013   Calcaneal spur 02/26/2013   Drug addiction syndrome (HCC) 10/05/2011   Displacement of lumbar intervertebral disc 07/11/2006   Cerebral artery occlusion with cerebral infarction (HCC) 01/11/2006   PPD positive 02/08/2004   Depressive disorder 08/11/1999   HIV (human immunodeficiency virus infection) (HCC) 05/06/1999    PCP: [facility]  REFERRING PROVIDER: Clois Fret, MD   REFERRING DIAG: G95.9 (ICD-10-CM) - Cervical myelopathy (HCC) Z98.1 (ICD-10-CM) - S/P cervical spinal fusion M54.2 (ICD-10-CM) - Cervicalgia  THERAPY DIAG:  Cervicalgia - Plan: PT plan of care cert/re-cert  Radiculopathy, cervical region - Plan: PT plan of care cert/re-cert  Pain in left arm - Plan: PT plan of care cert/re-cert  Muscle weakness (generalized) - Plan: PT plan of care cert/re-cert  Difficulty in walking, not elsewhere classified - Plan: PT plan of care cert/re-cert  History of falling - Plan: PT plan of care cert/re-cert  Rationale for Evaluation and Treatment: Rehabilitation  ONSET DATE: 03/23/2023 Cervical fusion surgery  SUBJECTIVE:                                                                                                                                                                                                         SUBJECTIVE STATEMENT: Posterior neck pain: 8/10 currently, 10/10 at worst for the past 3 months.  L lateral neck and arm: 7/10 currently, 10/10 at worst for the 3 months.   Hand dominance: Right  PERTINENT HISTORY:  Pt had surgery in her neck 03/23/2023 secondary to dropping a lot of things with her hands. Pt still drops items  from her hands the same amount of time compared to before the surgery. Did not have neck pain begore the surgery. Currently has neck pain which radiates to her L UE to fingers along the C5/C6 dermatome and whole L hand. No pain in R UE. Symptoms are the same since surgery.   Pt also states having seizures. Passes out of she gets too hot. Last time she passed out was Friday 12/02/2023. Pt states she does not want the ambulance called if it happens but just to put something wet on her face.    Blood pressure is controlled per pt.    PAIN:  Are you having pain? Yes: NPRS scale: 8/10 Pain location: posterior and L lateral neck and L UE to hand.  Pain description: sharp, burns, sore Aggravating factors: raising her L arm, sleeping on her L side, on her back Relieving factors: muscle rub  PRECAUTIONS: Falls, Seizures (when too warm/overheats), HIV  RED FLAGS: Bowel or bladder incontinence: Yes: doctor does not know. Pt was recommended to tell her doctor. Pt verbalized understanding and Cauda equina syndrome: No     WEIGHT BEARING RESTRICTIONS: No  FALLS:  Has patient fallen in last 6 months? Yes. Number of falls Pt states that she is always falling. Pt has been falling for a long time. Pt drops stuff.   LIVING ENVIRONMENT: Lives with: lives with their partner Lives in: House/apartment Stairs: No Has following equipment at home: Vannie - 2 wheeled  OCCUPATION: retired  PLOF: Requires assistive device for independence and Needs assistance with ADLs  PATIENT GOALS: Be able to swing her L arm.   NEXT MD VISIT: Does not know   OBJECTIVE:  Note: Objective measures were completed at Evaluation unless otherwise noted.  DIAGNOSTIC FINDINGS:  CT CERVICAL SPINE WO CONTRAST 11/14/2023  Narrative & Impression  CLINICAL DATA:  Acute neck pain.  Prior cervical spine surgery.   EXAM: CT CERVICAL SPINE WITHOUT CONTRAST   TECHNIQUE: Multidetector CT imaging of the cervical spine was  performed without intravenous contrast. Multiplanar CT image reconstructions were also generated.   RADIATION DOSE REDUCTION: This exam was performed according to the departmental dose-optimization program which includes automated exposure control, adjustment of the mA and/or kV according to patient size and/or use of iterative reconstruction technique.   COMPARISON:  Cervical MRI 01/03/2023   FINDINGS: Alignment: Normal.   Skull base and vertebrae: ACDF at C3-C6 with solid arthrodesis. Hardware is located and intact. No evidence of fracture or bone lesion.   Soft tissues and spinal canal: No swelling or inflammation seen in the soft tissues.   Disc levels:   C2-3: Mild disc height loss.  No bony impingement   C3-4: ACDF. Degree of residual right foraminal stenosis from uncovertebral and facet spurring. There patency of the left foramen   C4-5: ACDF with solid arthrodesis.  No high-grade bony impingement.   C5-6: ACDF with solid arthrodesis. Mild to moderate crowding of the foramina from residual uncovertebral spurs.   C6-7: Disc narrowing with endplate and uncovertebral ridging eccentric to the right where there is mild-to-moderate foraminal stenosis by sagittal images. Disc collapse with mainly ventral endplate ridging.   C7-T1:Disc collapse with ventral endplate ridging.   Upper chest: Clear apical lungs   IMPRESSION: Uncomplicated C3-C6 ACDF with solid arthrodesis. Similar degree of adjacent segment degeneration when compared to neck CT 02/28/2022     Electronically Signed   By: Dorn Roulette M.D.   On: 12/02/2023 09:01    DG Cervical Spine 2 or 3 views 09/27/2023  Narrative & Impression  CLINICAL DATA:  cervical fusion   EXAM: CERVICAL SPINE - 2 VIEW   COMPARISON:  06/28/2023.   FINDINGS: No fracture, dislocation or subluxation. No spondylolisthesis. No osteolytic or osteoblastic changes. Prevertebral and cervical cranial soft tissues are  unremarkable.   Degenerative disc disease noted with disc space narrowing and marginal osteophytes at C6-7. C3-C6 ACDF.   IMPRESSION: Degenerative changes. No acute osseous abnormalities.     Electronically Signed   By: Fonda Field M.D.   On: 10/20/2023 19:38     DG Shoulder Left 04/07/2023  Narrative & Impression  CLINICAL DATA:  Pain.   EXAM: LEFT SHOULDER - 3 VIEW   COMPARISON:  None Available.   FINDINGS: There degenerative changes at the [facility] joint. No acute fracture, dislocation or subluxation identified. No osteolytic or osteoblastic lesions identified.   IMPRESSION: Acromioclavicular degenerative changes.     Electronically Signed   By: Fonda Field M.D.   On: 04/09/2023 13:21       PATIENT SURVEYS:  FOTO Neck FOTO 65 (12/06/2023)  COGNITION: Overall cognitive status: Within functional limits for tasks assessed   POSTURE: forward neck B protracted shoulders, L shoulder lower. Movement crease around C6/7 area  PALPATION: Cervical paraspinal and B upper trap muscle tension. TTP L upper trap. TTP entire L UE   CERVICAL ROM:   Active ROM A/PROM (deg) eval  Flexion WFL with L lateral neck pull  Extension limited  Right lateral flexion Limited with L lateral neck pull  Left lateral flexion Limited with L lateral neck pull  Right rotation Limited with L lateral neck pull  Left rotation Limited with L lateral neck pull.    (Blank rows = not  tested)  UPPER EXTREMITY ROM:  Active ROM Right eval Left eval  Shoulder flexion 143 80 degrees with L anterior arm pain; L shoulder IR compensation  Shoulder extension    Shoulder abduction 144 100 degrees with L anterior arm burning, eases with rest  Shoulder adduction    Shoulder extension    Shoulder internal rotation    Shoulder external rotation    Elbow flexion    Elbow extension    Wrist flexion    Wrist extension    Wrist ulnar deviation    Wrist radial deviation    Wrist pronation     Wrist supination     (Blank rows = not tested)  UPPER EXTREMITY MMT:  MMT Right eval Left eval  Shoulder flexion 4 3+ with anterior arm burning  Shoulder extension    Shoulder abduction 4 3- with anterior arm pain  Shoulder adduction    Shoulder extension    Shoulder internal rotation 4 3+ with pain  Shoulder external rotation 4 3 with pain  Middle trapezius     Lower trapezius (seated manually resisted) 4- 3+  Elbow flexion    Elbow extension    Wrist flexion    Wrist extension    Wrist ulnar deviation    Wrist radial deviation    Wrist pronation    Wrist supination    Grip strength     (Blank rows = not tested)  CERVICAL SPECIAL TESTS:    FUNCTIONAL TESTS:   TUG with B UE assist 20.30 seconds, 21.17 seconds  (20.7 seconds average, no AD, decreased B foot clearance)  Gait: decreased stance R LE with R lateral lean, decreased B foot clearance   TREATMENT DATE: 12/06/2023                                                                                                                                 PATIENT EDUCATION:  Education details: POC Person educated: Patient Education method: Explanation Education comprehension: verbalized understanding  HOME EXERCISE PROGRAM:    ASSESSMENT:  CLINICAL IMPRESSION: Patient is a 73 y.o. female who was seen today for physical therapy evaluation and treatment for cervicalgia. Pt is S/P ACDF C3-6 on 03/23/2023. She also presents with altered gait pattern and posture, history of falls, L UE weakness, neck and L UE pain, altered scapular mechanics, TTP L UE, and difficulty using her L UE for functional tasks such as raising her arm, lifting, as well as difficulty sleeping secondary to neck and L UE pain. Pt will benefit from skilled physical therapy services to address the aforementioned deficits.      OBJECTIVE IMPAIRMENTS: decreased activity tolerance, decreased balance, decreased ROM, decreased strength, improper body  mechanics, postural dysfunction, and pain.   ACTIVITY LIMITATIONS: carrying, lifting, sleeping, transfers, reach over head, and hygiene/grooming  PARTICIPATION LIMITATIONS:   PERSONAL FACTORS: Age, Fitness, Past/current experiences, Social background, Time since onset of injury/illness/exacerbation, and 3+ comorbidities: anxiety, arthritis,  low back pain, cocaine use, chronic pain syndrome, coronary artery disease, dyspnea, hepatitis, HIV, HTN, MI, neuropathy, obesity, neuropathy,seizures, CVA, tobacco use disorder, back surgery,   are also affecting patient's functional outcome.   REHAB POTENTIAL: Fair    CLINICAL DECISION MAKING: Stable/uncomplicated  EVALUATION COMPLEXITY: Low   GOALS: Goals reviewed with patient? Yes  SHORT TERM GOALS: Target date: 12/16/2023  Pt will be independent with her initial HEP to decrease pain, improve strength, function, and ability to use her L UE more comfortably.  Baseline: Pt has not yet started her HEP (12/06/2023) Goal status: INITIAL  LONG TERM GOALS: Target date: 02/03/2024  Pt will have a decrease in L UE pain to 5/10 or less at worst to promote ability to raise her arm, reach, lift, and sleep more comfortably.  Baseline: L lateral neck and UE 10/10 at worst for the 3 months.  Goal status: INITIAL  2.  Pt will have a decrease in neck pain to 6/10 or less at worst to promote ability to sleep as well as look around more comfortably. Baseline: 10/10 neck pain at worst for the past 3 months (12/06/2023)  Goal status: INITIAL  3.  Pt will improve B shoulder flexion, abduction, ER, IR and lower trap strength by at least 1/2 MMT grade to promote ability to use her L UE for functional tasks with less difficulty.  Baseline:  MMT Right eval Left eval  Shoulder flexion 4 3+ with anterior arm burning  Shoulder abduction 4 3- with anterior arm pain  Shoulder internal rotation 4 3+ with pain  Shoulder external rotation 4 3 with pain  Lower trapezius  (seated manually resisted) 4- 3+   (12/06/2023)  Goal status: INITIAL  4.  Pt will improve her TUG time without AD and B UE assist to 12 seconds or less to promote funcntional mobility and balance.   Baseline: TUG with B UE assist 20.7 seconds average, no AD, decreased B foot clearance (12/06/2023) Goal status: INITIAL  5.  Pt will improve her neck FOTO score by at least 10 points as a demonstration of improved function.  Baseline: Neck FOTO 65 (12/06/2023) Goal status: INITIAL     PLAN:  PT FREQUENCY: 2x/week  PT DURATION: 8 weeks  PLANNED INTERVENTIONS: 97110-Therapeutic exercises, 97530- Therapeutic activity, [id number]- Neuromuscular re-education, 97140- Manual therapy, [phone]- Gait training, Patient/Family education, and Balance training  PLAN FOR NEXT SESSION: posture, scapular, anterior cervical, ER strengthening, neural flossing, manual techniques.    Theoplis Garciagarcia, PT, DPT 12/06/2023, 4:36 PM

## 2023-12-08 ENCOUNTER — Ambulatory Visit
Admission: RE | Admit: 2023-12-08 | Discharge: 2023-12-08 | Disposition: A | Discharge status: Home / Self Care | Payer: Medicare HMO | Source: Ambulatory Visit | Attending: Neurosurgery | Admitting: Neurosurgery

## 2023-12-08 ENCOUNTER — Ambulatory Visit: Payer: Medicare HMO

## 2023-12-08 ENCOUNTER — Telehealth: Payer: Self-pay

## 2023-12-08 DIAGNOSIS — M542 Cervicalgia: Secondary | ICD-10-CM

## 2023-12-08 DIAGNOSIS — G959 Disease of spinal cord, unspecified: Secondary | ICD-10-CM

## 2023-12-08 DIAGNOSIS — Z981 Arthrodesis status: Secondary | ICD-10-CM

## 2023-12-08 NOTE — Telephone Encounter (Signed)
 No show. Called patient and left a message pertaining to appointment and a reminder for the next follow up session. Return phone call requested. Phone number [phone]) provided.

## 2023-12-12 ENCOUNTER — Ambulatory Visit: Payer: Medicare HMO

## 2023-12-12 ENCOUNTER — Telehealth: Payer: Self-pay

## 2023-12-12 NOTE — Telephone Encounter (Signed)
 No show. Called patient and left a message pertaining to appointment and a reminder for the next follow up session. Return phone call requested. Phone number [phone]) provided.

## 2023-12-13 ENCOUNTER — Ambulatory Visit: Payer: Medicare HMO | Admitting: Student in an Organized Health Care Education/Training Program

## 2023-12-14 ENCOUNTER — Ambulatory Visit: Payer: Medicare HMO

## 2023-12-19 ENCOUNTER — Ambulatory Visit: Payer: Medicare HMO

## 2023-12-29 ENCOUNTER — Ambulatory Visit: Payer: Medicare HMO | Admitting: Neurosurgery

## 2024-01-03 ENCOUNTER — Encounter: Payer: Self-pay | Admitting: Neurosurgery

## 2024-02-02 ENCOUNTER — Ambulatory Visit: Payer: Medicare HMO | Admitting: Neurosurgery

## 2024-02-02 VITALS — BP 130/78 | Ht 63.0 in | Wt 218.0 lb

## 2024-02-02 DIAGNOSIS — M25512 Pain in left shoulder: Secondary | ICD-10-CM | POA: Diagnosis not present

## 2024-02-02 DIAGNOSIS — Z981 Arthrodesis status: Secondary | ICD-10-CM | POA: Diagnosis not present

## 2024-02-02 DIAGNOSIS — G959 Disease of spinal cord, unspecified: Secondary | ICD-10-CM

## 2024-02-02 DIAGNOSIS — M542 Cervicalgia: Secondary | ICD-10-CM

## 2024-02-02 NOTE — Progress Notes (Signed)
   REFERRING PHYSICIAN:  [facility] [address] [place],  [place] [address]  DOS: 03/23/23 ACDF C3-C6 for myelopathy  HISTORY OF PRESENT ILLNESS: Lisa Hawkins continues to have issues with chronic myelopathy such as difficulty walking.  Additionally, she is having neck pain.  She also has left shoulder pain.  Her neck and shoulder pain are causing her significant discomfort.  She cannot lay down on her left shoulder.  She has pain with movement of her left arm.     PHYSICAL EXAMINATION:  NEUROLOGICAL:  General: In no acute distress.   Awake, alert, oriented to person, place, and time.  Pupils equal round and reactive to light.  Facial tone is symmetric.    Strength: Side Biceps Triceps Deltoid Interossei Grip Wrist Ext. Wrist Flex.  R 5 5 5 5 5 5 5   L 5 5 4 5 5 5 5    She has pain with above strength testing.  She has substantial tenderness to palpation of her left shoulder.    Incision well healed.     Imaging:  Cervical xrays dated 06/28/23:  No complications noted.  Cervical spine x-rays today-no change in position, no complications noted  C spine MRI 12/08/2023  IMPRESSION: 1. Anterior and interbody fusion changes at C3-4, C4-5 and C5-6. 2. Chronic cord ischemic changes at C4-5. 3. Stable degenerative disc disease and facet disease at C6-7 and C7-T1.     Electronically Signed   By: Rudie Meyer M.D.   On: 12/19/2023 11:24  CT C spine 11/14/2023 IMPRESSION: Uncomplicated C3-C6 ACDF with solid arthrodesis. Similar degree of adjacent segment degeneration when compared to neck CT 02/28/2022     Electronically Signed   By: Tiburcio Pea M.D.   On: 12/02/2023 09:01  Assessment / Plan: Lisa Hawkins continues with pain s/p above surgery.   Her imaging studies are reassuring.  She does not have a pseudoarthrosis.  She has no significant stenosis at this time.  She has substantial left shoulder pain with range of motion testing as  well as tenderness to palpation.  She has an abnormal x-ray from last year.  I recommend an MRI scan and referral to orthopedic surgery.  I spent a total of 15 minutes in this patient's care today. This time was spent reviewing pertinent records including imaging studies, obtaining and confirming history, performing a directed evaluation, formulating and discussing my recommendations, and documenting the visit within the medical record.   Venetia Night MD Dept of Neurosurgery

## 2024-03-19 ENCOUNTER — Encounter: Payer: Self-pay | Admitting: Radiology

## 2024-03-29 ENCOUNTER — Emergency Department

## 2024-03-29 ENCOUNTER — Emergency Department
Admission: EM | Admit: 2024-03-29 | Discharge: 2024-03-29 | Disposition: A | Discharge status: Home / Self Care | Attending: Emergency Medicine | Admitting: Emergency Medicine

## 2024-03-29 ENCOUNTER — Other Ambulatory Visit: Payer: Self-pay

## 2024-03-29 DIAGNOSIS — M25551 Pain in right hip: Secondary | ICD-10-CM | POA: Insufficient documentation

## 2024-03-29 MED ORDER — OXYCODONE-ACETAMINOPHEN 5-325 MG PO TABS
1.0000 | ORAL_TABLET | Freq: Once | ORAL | Status: AC
  Administered 2024-03-29: 1 via ORAL
  Filled 2024-03-29: qty 1

## 2024-03-29 MED ORDER — ONDANSETRON 4 MG PO TBDP
4.0000 mg | ORAL_TABLET | Freq: Once | ORAL | Status: AC
  Administered 2024-03-29: 4 mg via ORAL
  Filled 2024-03-29: qty 1

## 2024-03-29 MED ORDER — OXYCODONE-ACETAMINOPHEN 5-325 MG PO TABS: 1.0000 | ORAL_TABLET | ORAL | 0 refills | Status: DC | PRN

## 2024-03-29 NOTE — Discharge Instructions (Signed)
 Please call the number provided for orthopedics to arrange a follow-up appointment regarding your right hip pain.  Please take your pain medication as needed but only as prescribed.  Do not drink alcohol or drive while taking pain medication.  Return to the emergency department for any significant worsening of pain or any other symptom personally concerning to yourself.

## 2024-03-29 NOTE — ED Notes (Signed)
 Pt back from XR

## 2024-03-29 NOTE — ED Notes (Signed)
 This RN attempted to call friend "Ace Abu" for pickup. No voicemail available. Pt offered a taxi voucher. Pt is AXO, able to ambulate with a cane and has a key to access her home upon return. No further questions or concerns discussed regarding discharge instructions.

## 2024-03-29 NOTE — ED Notes (Signed)
 Pt to XR

## 2024-03-29 NOTE — ED Provider Notes (Signed)
 [facility] Provider Note    Event Date/Time   First MD Initiated Contact with Patient 03/29/24 [phone]     (approximate)  History   Chief Complaint: Hip Pain  HPI  Lisa Hawkins is a 73 y.o. female with a past medical history of anxiety, chronic pain, gastric reflux, HIV presents to the emergency department for right hip pain.  According to the patient for the last 2 days she has been experiencing significant pain in the right hip.  Patient has been using a cane to ambulate.  States she is able to walk but with significant pain in the right hip.  Patient denies any falls or trauma.  As a secondary complaint patient states she is experiencing pain in her left arm that has been ongoing for nearly a year since she had surgery performed.  No chest pain no shortness of breath.  No fever.  Physical Exam   Triage Vital Signs: ED Triage Vitals  Encounter Vitals Group     BP 03/29/24 0751 118/85     Systolic BP Percentile --      Diastolic BP Percentile --      Pulse Rate 03/29/24 0751 92     Resp 03/29/24 0751 17     Temp 03/29/24 0751 98.7 F (37.1 C)     Temp Source 03/29/24 0751 Oral     SpO2 03/29/24 0751 100 %     Weight 03/29/24 0752 214 lb (97.1 kg)     Height 03/29/24 0752 5\' 3"  (1.6 m)     Head Circumference --      Peak Flow --      Pain Score 03/29/24 0752 6     Pain Loc --      Pain Education --      Exclude from Growth Chart --     Most recent vital signs: Vitals:   03/29/24 0751  BP: 118/85  Pulse: 92  Resp: 17  Temp: 98.7 F (37.1 C)  SpO2: 100%    General: Awake, no distress.  CV:  Good peripheral perfusion.  Regular rate and rhythm  Resp:  Normal effort.  Equal breath sounds bilaterally.  Abd:  No distention.  Soft, nontender.  No rebound or guarding. Other:  Both lower extremities are neurovascularly intact.  Good range of motion left lower extremity with no pain elicited.  Patient states pain with attempted range of motion of  the right lower extremity.  States pain in the right hip but has good range of motion.   ED Results / Procedures / Treatments   EKG  EKG viewed and interpreted by myself shows a normal sinus rhythm at 91 bpm with a narrow QRS, left axis deviation, largely normal intervals with no concerning ST changes.  RADIOLOGY  I have reviewed and interpreted the hip x-ray images.  No obvious hip or pelvis fracture seen on my evaluation. Radiologist read the x-ray as negative   MEDICATIONS ORDERED IN ED: Medications  oxyCODONE -acetaminophen  (PERCOCET/ROXICET) 5-325 MG per tablet 1 tablet (has no administration in time range)  ondansetron  (ZOFRAN -ODT) disintegrating tablet 4 mg (has no administration in time range)     IMPRESSION / MDM / ASSESSMENT AND PLAN / ED COURSE  I reviewed the triage vital signs and the nursing notes.  Patient's presentation is most consistent with acute presentation with potential threat to life or bodily function.  Patient presents to the emergency department with complaints of right hip pain ongoing over the last  2 days.  Denies any falls or trauma.  Patient is also complaining of some left arm pain x 1 year since getting surgery.  Overall the patient appears well, no distress.  Reassuring vital signs.  We will dose a small dose of pain medication.  Will obtain x-ray images of the right hip and pelvis to further evaluate.  Patient agreeable to plan.  X-rays negative.  Will discharge with a short course of pain medication.  Patient will need to follow-up with orthopedics.  Patient agreeable to plan.  Discussed return precautions.    FINAL CLINICAL IMPRESSION(S) / ED DIAGNOSES   Right hip pain   Note:  This document was prepared using Dragon voice recognition software and may include unintentional dictation errors.   Ruth Cove, MD 03/29/24 [phone]

## 2024-03-29 NOTE — ED Triage Notes (Signed)
 Pt BIB [facility] from home c/o R upper hip pain. Pt has not had any recent falls and denies being on blood thinners. Per EMS pt changes complaint frequently and stated she has reoccurring falls, episodes of diarrhea  and has a hx of seizures which have been unmedicated for x1 month. NAD.  126/90 102 HR 98 RA

## 2024-04-17 ENCOUNTER — Telehealth: Payer: Self-pay

## 2024-04-17 DIAGNOSIS — G8929 Other chronic pain: Secondary | ICD-10-CM

## 2024-04-17 NOTE — Telephone Encounter (Signed)
 Spoke to patient and gave her the address to have her xrays done  she said she will make sure she has them done

## 2024-04-18 ENCOUNTER — Emergency Department
Admission: EM | Admit: 2024-04-18 | Discharge: 2024-04-18 | Disposition: A | Discharge status: Home / Self Care | Attending: Emergency Medicine | Admitting: Emergency Medicine

## 2024-04-18 ENCOUNTER — Emergency Department

## 2024-04-18 ENCOUNTER — Other Ambulatory Visit: Payer: Self-pay

## 2024-04-18 ENCOUNTER — Encounter: Payer: Self-pay | Admitting: Emergency Medicine

## 2024-04-18 DIAGNOSIS — M25512 Pain in left shoulder: Secondary | ICD-10-CM | POA: Insufficient documentation

## 2024-04-18 DIAGNOSIS — Z21 Asymptomatic human immunodeficiency virus [HIV] infection status: Secondary | ICD-10-CM | POA: Diagnosis not present

## 2024-04-18 DIAGNOSIS — M545 Low back pain, unspecified: Secondary | ICD-10-CM | POA: Diagnosis present

## 2024-04-18 DIAGNOSIS — M79602 Pain in left arm: Secondary | ICD-10-CM | POA: Diagnosis not present

## 2024-04-18 DIAGNOSIS — I10 Essential (primary) hypertension: Secondary | ICD-10-CM | POA: Diagnosis not present

## 2024-04-18 DIAGNOSIS — M25552 Pain in left hip: Secondary | ICD-10-CM | POA: Insufficient documentation

## 2024-04-18 DIAGNOSIS — G8929 Other chronic pain: Secondary | ICD-10-CM | POA: Diagnosis not present

## 2024-04-18 MED ORDER — OXYCODONE-ACETAMINOPHEN 5-325 MG PO TABS: 1.0000 | ORAL_TABLET | Freq: Two times a day (BID) | ORAL | 0 refills | Status: AC | PRN

## 2024-04-18 MED ORDER — ONDANSETRON HCL 4 MG/2ML IJ SOLN
4.0000 mg | Freq: Once | INTRAMUSCULAR | Status: AC
  Administered 2024-04-18: 4 mg via INTRAVENOUS
  Filled 2024-04-18: qty 2

## 2024-04-18 MED ORDER — MORPHINE SULFATE (PF) 4 MG/ML IV SOLN
4.0000 mg | Freq: Once | INTRAVENOUS | Status: AC
  Administered 2024-04-18: 4 mg via INTRAVENOUS
  Filled 2024-04-18: qty 1

## 2024-04-18 MED ORDER — OXYCODONE HCL 5 MG PO TABS
5.0000 mg | ORAL_TABLET | Freq: Once | ORAL | Status: AC
  Administered 2024-04-18: 5 mg via ORAL
  Filled 2024-04-18: qty 1

## 2024-04-18 NOTE — ED Provider Notes (Signed)
 [facility] Provider Note    Event Date/Time   First MD Initiated Contact with Patient 04/18/24 1344     (approximate)   History   Back Pain   HPI  Lisa Hawkins is a 73 y.o. female history of hypertension, chronic pain syndrome, HIV, seizures presents emergency department complaint of severe pain in the lower back, left hip, and continued pain in her neck.  Patient recently had a disc removed in her neck.  States she is still having some burning in into the left shoulder and left arm.  However she does see Dr. Jeris Montes for this and has an appointment for a follow-up appointment.  Denies loss of bowel or bladder control, however patient has had incontinence because she is trying to get to the bathroom but is moving too slowly.      Physical Exam   Triage Vital Signs: ED Triage Vitals [04/18/24 1315]  Encounter Vitals Group     BP (!) 150/93     Systolic BP Percentile      Diastolic BP Percentile      Pulse Rate (!) 103     Resp 17     Temp 98.4 F (36.9 C)     Temp Source Oral     SpO2 95 %     Weight 213 lb (96.6 kg)     Height 5\' 3"  (1.6 m)     Head Circumference      Peak Flow      Pain Score 9     Pain Loc      Pain Education      Exclude from Growth Chart     Most recent vital signs: Vitals:   04/18/24 1410 04/18/24 1430  BP: 96/80 104/67  Pulse: (!) 107 (!) 103  Resp: 18 16  Temp:    SpO2: 97% 97%     General: Awake, no distress.   CV:  Good peripheral perfusion.  Resp:  Normal effort.  Abd:  No distention.   Other:  Patient complains of tenderness and pain everywhere to touch her.  Lumbar spine is tender, left hip is tender, however she does have 5/5 strength in lower extremity, grips are equal bilaterally, no rotation of the leg noted.  Neurovascular is intact   ED Results / Procedures / Treatments   Labs (all labs ordered are listed, but only abnormal results are displayed) Labs Reviewed - No data to  display   EKG     RADIOLOGY CT lumbar spine, x-ray of the left hip    PROCEDURES:   Procedures  Critical Care: No Chief Complaint  Patient presents with   Back Pain      MEDICATIONS ORDERED IN ED: Medications  morphine  (PF) 4 MG/ML injection 4 mg (4 mg Intravenous Given 04/18/24 1405)  ondansetron  (ZOFRAN ) injection 4 mg (4 mg Intravenous Given 04/18/24 1404)     IMPRESSION / MDM / ASSESSMENT AND PLAN / ED COURSE  I reviewed the triage vital signs and the nursing notes.                              Differential diagnosis includes, but is not limited to, chronic pain, muscle spasms, malingering, fracture, contusion, sprain  Patient's presentation is most consistent with acute illness / injury with system symptoms.   Cardiac monitor no Medications given: Morphine  4 mg IV, Zofran  4 mg IV  X-ray of the left  hip, CT lumbar spine  Care transferred to [facility], FNP at shift change      FINAL CLINICAL IMPRESSION(S) / ED DIAGNOSES   Final diagnoses:  Chronic bilateral low back pain without sciatica     Rx / DC Orders   ED Discharge Orders     None        Note:  This document was prepared using Dragon voice recognition software and may include unintentional dictation errors.    Delsie Figures, PA-C 04/18/24 1526    Ruth Cove, MD 04/19/24 2207

## 2024-04-18 NOTE — ED Provider Notes (Signed)
-----------------------------------------   6:17 PM on 04/18/2024 -----------------------------------------  Blood pressure 104/67, pulse (!) 101, temperature 97.8 F (36.6 C), temperature source Oral, resp. rate 16, height 5\' 3"  (1.6 m), weight 96.6 kg, SpO2 97%.  Assuming care from Lisa Keepers, PA-C.  In short, Lisa Hawkins is a 73 y.o. female with a chief complaint of Back Pain .  Refer to the original H&P for additional details.  The current plan of care is to review radiology report of the CT lumbar spine with likely plan of discharge.  ----------------------------------------- 6:17 PM on 04/18/2024 ----------------------------------------- Imaging of the lumbar spine and x-ray of the left hip is without acute concerns.  Plan will be to discharge her home with a few tablets of oxycodone  and have her follow-up with primary care if not improving over the week.  She will be encouraged to return to the emergency department if her symptoms change or worsen if she is unable to schedule an appointment.         Lisa Don, FNP 04/18/24 Lisa Kudo, MD 04/19/24 2207

## 2024-04-18 NOTE — ED Triage Notes (Signed)
 Patient to ED via POV for lower back pain. States ongoing since Monday. Denies injury. Ambulatory with cane.

## 2024-04-18 NOTE — ED Notes (Signed)
Triage delayed due to patient in restroom.

## 2024-04-19 ENCOUNTER — Ambulatory Visit: Admitting: Orthopedic Surgery

## 2024-05-01 ENCOUNTER — Telehealth: Payer: Self-pay

## 2024-05-01 ENCOUNTER — Other Ambulatory Visit: Payer: Self-pay

## 2024-05-01 DIAGNOSIS — G8929 Other chronic pain: Secondary | ICD-10-CM

## 2024-05-01 NOTE — Telephone Encounter (Signed)
 Patient will be going to have her xray before apppointment  orders are in

## 2024-05-02 ENCOUNTER — Telehealth: Payer: Self-pay

## 2024-05-02 ENCOUNTER — Ambulatory Visit: Admitting: Orthopedic Surgery

## 2024-05-02 ENCOUNTER — Telehealth: Payer: Self-pay | Admitting: Orthopedic Surgery

## 2024-05-02 NOTE — Telephone Encounter (Signed)
 Spoke to patient and gave her the address to to go have xrays

## 2024-05-02 NOTE — Telephone Encounter (Signed)
 Pt called and rescheduled her appt for 6/18. Please call pt where she need to go for xrays. Pt phone number is [phone].

## 2024-05-14 ENCOUNTER — Telehealth: Payer: Self-pay

## 2024-05-14 NOTE — Telephone Encounter (Signed)
Called patient and mailbox is not set up

## 2024-05-16 ENCOUNTER — Ambulatory Visit: Admitting: Orthopedic Surgery

## 2024-06-11 ENCOUNTER — Encounter: Payer: Self-pay | Admitting: Orthopedic Surgery

## 2024-06-11 ENCOUNTER — Ambulatory Visit: Admitting: Orthopedic Surgery

## 2024-06-11 ENCOUNTER — Telehealth: Payer: Self-pay

## 2024-06-11 VITALS — BP 144/75 | Ht 63.0 in | Wt 191.0 lb

## 2024-06-11 DIAGNOSIS — G8929 Other chronic pain: Secondary | ICD-10-CM

## 2024-06-11 DIAGNOSIS — M25512 Pain in left shoulder: Secondary | ICD-10-CM | POA: Diagnosis not present

## 2024-06-11 NOTE — Patient Instructions (Signed)

## 2024-06-11 NOTE — Telephone Encounter (Signed)
 Called patient and recording call can not be completed at dialed and called the emergency number as well and call can not be completed as dialed

## 2024-06-11 NOTE — Progress Notes (Signed)
 New Patient Visit  Assessment: Lisa Hawkins is a 73 y.o. female with the following: 1. Chronic left shoulder pain  Plan: Lisa Hawkins has chronic pain in the left shoulder.  She underwent surgery on her neck, greater than a year ago.  Since then, she has continued to have this pain.  She reports that it radiates from the neck distally into the left arm.  On physical exam, she has good range of motion of her shoulder.  She does have some tenderness over the anterior shoulder, including the bicipital groove.  Pain with evocative testing of the long head of the biceps.  Positive Jobe.  Positive empty can testing.  Radiographs from approximately 1 year ago are available in clinic today.  Minimal degenerative changes.  No obvious chronic injury to the left shoulder.  We discussed proceeding with an injection, and she elected proceed.  This was completed in clinic today.  She will follow-up as needed.   Procedure note injection Left shoulder    Verbal consent was obtained to inject the left shoulder, subacromial space Timeout was completed to confirm the site of injection.  The skin was prepped with alcohol and ethyl chloride was sprayed at the injection site.  A 21-gauge needle was used to inject 40 mg of Depo-Medrol  and 1% lidocaine  (4 cc) into the subacromial space of the left shoulder using a posterolateral approach.  There were no complications. A sterile bandage was applied.   Follow-up: Return if symptoms worsen or fail to improve.  Subjective:  Chief Complaint  Patient presents with   Shoulder Pain    Had operation on her neck and since then she has been having problems  the pain in radiating from her neck to her upper arm     History of Present Illness: Lisa Hawkins is a 73 y.o. female who has been referred by Reeves Nine, MD for evaluation of left shoulder pain.  She is right-hand dominant.  She states that she has had pain in the left shoulder for over a year.   She underwent surgery on her neck, in March, 2024.  Since then, she has had pain which radiates from the left side of her neck through the anterior aspect of the left shoulder, and distally towards her hand.  No numbness or tingling.  Medications have been effective.  She has tried some exercises, with limited improvement.  No injections.   Review of Systems: No fevers or chills No numbness or tingling No chest pain No shortness of breath No bowel or bladder dysfunction No GI distress No headaches   Medical History:  Past Medical History:  Diagnosis Date   Anxiety    Arthritis    Bronchitis    Chronic bilateral low back pain with bilateral sciatica 05/19/2020   Chronic pain syndrome    Cocaine use    Complication of anesthesia    Coronary artery disease    Dyspnea    with exertion   Dysrhythmia    hx of flutter, no longer present   Elevated lipids    GERD (gastroesophageal reflux disease)    Hepatitis    Hep C - treated with Harvoni   History of methicillin resistant staphylococcus aureus (MRSA) 2018   HIV (human immunodeficiency virus infection) (HCC)    1999   HOH (hard of hearing)    Hypertension    Hypokalemia    Hypomagnesemia    Intentional overdose (HCC)    Kidney lesion  Myocardial infarction (HCC) 1993   Neuropathy    Obesity    PONV (postoperative nausea and vomiting)    Seizures (HCC)    not considered seizures but does black out and unsure of what happened. happens when she overheats. last episode 4 days ago.   Stroke [facility]) 1990   no defecits   Suicidal ideation 2023   Tobacco use disorder     Past Surgical History:  Procedure Laterality Date   ABDOMINAL HYSTERECTOMY  1970   ANTERIOR CERVICAL DECOMP/DISCECTOMY FUSION N/A 03/23/2023   Procedure: C3-6 ANTERIOR CERVICAL DISCECTOMY AND FUSION;  Surgeon: Clois Fret, MD;  Location: [facility] ORS;  Service: Neurosurgery;  Laterality: N/A;   BACK SURGERY     no metal-Lumbar   CATARACT EXTRACTION  W/PHACO Right 06/21/2017   Procedure: CATARACT EXTRACTION PHACO AND INTRAOCULAR LENS PLACEMENT (IOC);  Surgeon: Jaye Fallow, MD;  Location: [facility] ORS;  Service: Ophthalmology;  Laterality: Right;  US  00:30.9 AP% 12.7 CDE 3.94 Fluid Pack Lot # [id number]   CATARACT EXTRACTION W/PHACO Left 07/12/2017   Procedure: CATARACT EXTRACTION PHACO AND INTRAOCULAR LENS PLACEMENT (IOC);  Surgeon: Jaye Fallow, MD;  Location: [facility] ORS;  Service: Ophthalmology;  Laterality: Left;  US   00:42 AP% 15.2 CDE 6.38 Fluid pack lot # [id number]   COLONOSCOPY WITH PROPOFOL  N/A 12/09/2020   Procedure: COLONOSCOPY WITH PROPOFOL ;  Surgeon: Therisa Bi, MD;  Location: [facility] ENDOSCOPY;  Service: Gastroenterology;  Laterality: N/A;   COLONOSCOPY WITH PROPOFOL  N/A 09/06/2022   Procedure: COLONOSCOPY WITH PROPOFOL ;  Surgeon: Therisa Bi, MD;  Location: [facility] ENDOSCOPY;  Service: Gastroenterology;  Laterality: N/A;   DIAGNOSTIC LAPAROSCOPY     FRACTURE SURGERY Left    wrist   HERNIA REPAIR  2010   umbilical   KNEE CLOSED REDUCTION Left 12/30/2016   Procedure: CLOSED MANIPULATION KNEE;  Surgeon: Ozell Flake, MD;  Location: [facility] ORS;  Service: Orthopedics;  Laterality: Left;   LAPAROTOMY     TONSILLECTOMY     TOTAL KNEE ARTHROPLASTY Right 07/15/2016   Procedure: TOTAL KNEE ARTHROPLASTY;  Surgeon: Ozell Flake, MD;  Location: [facility] ORS;  Service: Orthopedics;  Laterality: Right;   TOTAL KNEE ARTHROPLASTY Left 10/26/2016   Procedure: TOTAL KNEE ARTHROPLASTY;  Surgeon: Ozell Flake, MD;  Location: [facility] ORS;  Service: Orthopedics;  Laterality: Left;    Family History  Problem Relation Age of Onset   Hypertension Father    Social History   Tobacco Use   Smoking status: Every Day    Current packs/day: 0.00    Average packs/day: 1 pack/day for 45.0 years (45.0 ttl pk-yrs)    Types: Cigarettes    Start date: 02/08/1978    Last attempt to quit: 02/09/2023    Years since quitting: 1.3   Smokeless tobacco: Never    Tobacco comments:    Per patient she quit after her appt on 02/08/23, she is still using a nicotine  patch.   Vaping Use   Vaping status: Never Used  Substance Use Topics   Alcohol use: Yes    Comment: occ   Drug use: Yes    Types: Cocaine    Comment: + uds marijuana 03-17-23    Allergies  Allergen Reactions   Penicillins Swelling and Rash    Has patient had a PCN reaction causing immediate rash, facial/tongue/throat swelling, SOB or lightheadedness with hypotension: Yes Has patient had a PCN reaction causing severe rash involving mucus membranes or skin necrosis: No Has patient had a PCN reaction that required hospitalization Yes Has patient had  a PCN reaction occurring within the last 10 years: No If all of the above answers are NO, then may proceed with Cephalosporin use.     Current Meds  Medication Sig   abacavir -dolutegravir -lamiVUDine  (TRIUMEQ ) 600-50-300 MG tablet Take 1 tablet by mouth daily. (Patient taking differently: Take 1 tablet by mouth every morning.)   amitriptyline  (ELAVIL ) 100 MG tablet Take 1 tablet (100 mg total) by mouth at bedtime. (Patient taking differently: Take 100 mg by mouth every morning.)   amLODipine  (NORVASC ) 10 MG tablet Take 1 tablet (10 mg total) by mouth daily. (Patient taking differently: Take 10 mg by mouth every morning.)   Aspirin  81 MG CAPS Take 1 capsule by mouth daily.   atorvastatin  (LIPITOR) 40 MG tablet Take 40 mg by mouth at bedtime.   Calcium  Carb-Cholecalciferol 600-10 MG-MCG TABS Take by mouth daily.   celecoxib  (CELEBREX ) 100 MG capsule Take 1 capsule (100 mg total) by mouth 2 (two) times daily. (Patient taking differently: Take 100 mg by mouth every morning.)   FLUoxetine  (PROZAC ) 20 MG capsule Take 1 capsule (20 mg total) by mouth daily. (Patient taking differently: Take 20 mg by mouth every morning.)   gabapentin  (NEURONTIN ) 300 MG capsule Take 300 mg by mouth 2 (two) times daily.   hydrochlorothiazide  (HYDRODIURIL ) 25 MG tablet  Take 1 tablet (25 mg total) by mouth daily. (Patient taking differently: Take 25 mg by mouth every morning.)   loperamide  (IMODIUM ) 2 MG capsule Take 2 mg by mouth as needed for diarrhea or loose stools.   metFORMIN  (GLUCOPHAGE -XR) 500 MG 24 hr tablet Take 500 mg by mouth daily with breakfast.   methocarbamol  (ROBAXIN ) 500 MG tablet Take 1 tablet (500 mg total) by mouth every 8 (eight) hours as needed for muscle spasms. This can make you sleepy.   polyethylene glycol-electrolytes (NULYTELY) 420 g solution At 5pm evening before colonoscopy-Fill Nulytely container to the fill line with clear liquid.  Mix well.  Drink 8 oz every 20 mins until half contents have been completed. Continue clear liquid diet.  5 hours prior to colonoscopy resume drinking the remaining Nulytely prep drinking 8 oz every 20 mins until entire contents have been completed. So not eat or drink anything 2 hours prior to colonsocopy.   potassium chloride  SA (KLOR-CON  M) 20 MEQ tablet Take 1 tablet (20 mEq total) by mouth daily.   risperiDONE  (RISPERDAL ) 1 MG tablet Take 1 mg by mouth every morning.   senna (SENOKOT) 8.6 MG TABS tablet Take 1 tablet (8.6 mg total) by mouth 2 (two) times daily.   traZODone  (DESYREL ) 100 MG tablet Take 1 tablet (100 mg total) by mouth at bedtime as needed for sleep. (Patient taking differently: Take 100 mg by mouth at bedtime.)    Objective: BP (!) 144/75   Ht 5' 3 (1.6 m)   Wt 191 lb (86.6 kg)   BMI 33.83 kg/m   Physical Exam:  General: Alert and oriented. and No acute distress. Gait: Normal gait.  Left shoulder no deformity.  No swelling.  No redness.  Tenderness palpation over the bicipital groove.  She has good range of motion, including full forward flexion, abduction and internal rotation to T12.  This is similar to the contralateral side.  5/5 strength in abduction, supraspinatus, infraspinatus and subscapularis.  Positive Jobes.  Positive O'Brien's.  IMAGING: I personally reviewed  images previously obtained in clinic  X-rays of the left shoulder from approximately 1 year ago are available in clinic today.  No acute  injuries.  No evidence of chronic injury.   New Medications:  No orders of the defined types were placed in this encounter.     Oneil DELENA Horde, MD  06/11/2024 2:25 PM
